# Patient Record
Sex: Female | Born: 1945
Health system: Southern US, Community
[De-identification: ages and names within clinical notes are randomized; demographics above are authoritative.]

## PROBLEM LIST (undated history)

## (undated) DIAGNOSIS — R3911 Hesitancy of micturition: Secondary | ICD-10-CM

## (undated) DIAGNOSIS — F32A Depression, unspecified: Secondary | ICD-10-CM

## (undated) DIAGNOSIS — N958 Other specified menopausal and perimenopausal disorders: Secondary | ICD-10-CM

## (undated) DIAGNOSIS — M1990 Unspecified osteoarthritis, unspecified site: Secondary | ICD-10-CM

## (undated) DIAGNOSIS — T7840XA Allergy, unspecified, initial encounter: Secondary | ICD-10-CM

## (undated) DIAGNOSIS — D649 Anemia, unspecified: Secondary | ICD-10-CM

## (undated) DIAGNOSIS — I10 Essential (primary) hypertension: Secondary | ICD-10-CM

## (undated) DIAGNOSIS — E119 Type 2 diabetes mellitus without complications: Secondary | ICD-10-CM

## (undated) DIAGNOSIS — N1831 Chronic kidney disease, stage 3a: Secondary | ICD-10-CM

## (undated) DIAGNOSIS — L408 Other psoriasis: Secondary | ICD-10-CM

## (undated) DIAGNOSIS — F419 Anxiety disorder, unspecified: Secondary | ICD-10-CM

## (undated) DIAGNOSIS — M810 Age-related osteoporosis without current pathological fracture: Secondary | ICD-10-CM

## (undated) DIAGNOSIS — E785 Hyperlipidemia, unspecified: Secondary | ICD-10-CM

## (undated) DIAGNOSIS — M26622 Arthralgia of left temporomandibular joint: Secondary | ICD-10-CM

## (undated) DIAGNOSIS — K644 Residual hemorrhoidal skin tags: Secondary | ICD-10-CM

## (undated) DIAGNOSIS — M542 Cervicalgia: Secondary | ICD-10-CM

## (undated) DIAGNOSIS — R296 Repeated falls: Secondary | ICD-10-CM

## (undated) DIAGNOSIS — F329 Major depressive disorder, single episode, unspecified: Secondary | ICD-10-CM

## (undated) DIAGNOSIS — E663 Overweight: Secondary | ICD-10-CM

## (undated) HISTORY — DX: Other specified menopausal and perimenopausal disorders: N95.8

## (undated) HISTORY — PX: CHOLECYSTECTOMY: SHX55

## (undated) HISTORY — DX: Residual hemorrhoidal skin tags: K64.4

## (undated) HISTORY — PX: KNEE SURGERY: SHX244

## (undated) HISTORY — DX: Arthralgia of left temporomandibular joint: M26.622

## (undated) HISTORY — DX: Depression, unspecified: F32.A

## (undated) HISTORY — DX: Hyperlipidemia, unspecified: E78.5

## (undated) HISTORY — DX: Type 2 diabetes mellitus without complications: E11.9

## (undated) HISTORY — DX: Chronic kidney disease, stage 3a: N18.31

## (undated) HISTORY — DX: Hesitancy of micturition: R39.11

## (undated) HISTORY — DX: Allergy, unspecified, initial encounter: T78.40XA

## (undated) HISTORY — DX: Anxiety disorder, unspecified: F41.9

## (undated) HISTORY — DX: Essential (primary) hypertension: I10

## (undated) HISTORY — DX: Anemia, unspecified: D64.9

## (undated) HISTORY — DX: Other psoriasis: L40.8

## (undated) HISTORY — PX: CATARACT EXTRACTION, BILATERAL: SHX1313

## (undated) HISTORY — PX: COLONOSCOPY: SHX174

## (undated) HISTORY — DX: Cervicalgia: M54.2

## (undated) HISTORY — DX: Repeated falls: R29.6

## (undated) HISTORY — DX: Overweight: E66.3

## (undated) HISTORY — PX: APPENDECTOMY: SHX54

## (undated) HISTORY — DX: Unspecified osteoarthritis, unspecified site: M19.90

## (undated) HISTORY — PX: OTHER SURGICAL HISTORY: SHX169

## (undated) HISTORY — DX: Age-related osteoporosis without current pathological fracture: M81.0

## (undated) HISTORY — PX: ABDOMINAL HYSTERECTOMY: SHX81

## (undated) HISTORY — PX: CARPAL TUNNEL RELEASE: SHX101

---
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
## 2001-12-30 ENCOUNTER — Encounter (INDEPENDENT_AMBULATORY_CARE_PROVIDER_SITE_OTHER): Payer: Self-pay | Admitting: Gastroenterology

## 2006-12-30 ENCOUNTER — Ambulatory Visit: Payer: Self-pay | Admitting: Internal Medicine

## 2007-01-13 ENCOUNTER — Ambulatory Visit: Payer: Self-pay | Admitting: Internal Medicine

## 2009-04-18 ENCOUNTER — Ambulatory Visit: Payer: Self-pay | Admitting: Internal Medicine

## 2009-04-18 DIAGNOSIS — K219 Gastro-esophageal reflux disease without esophagitis: Secondary | ICD-10-CM | POA: Insufficient documentation

## 2009-04-18 DIAGNOSIS — R1013 Epigastric pain: Secondary | ICD-10-CM | POA: Insufficient documentation

## 2009-04-18 DIAGNOSIS — R1319 Other dysphagia: Secondary | ICD-10-CM | POA: Insufficient documentation

## 2009-04-23 ENCOUNTER — Ambulatory Visit: Payer: Self-pay | Admitting: Internal Medicine

## 2009-04-23 ENCOUNTER — Encounter: Payer: Self-pay | Admitting: Internal Medicine

## 2009-04-24 ENCOUNTER — Encounter: Payer: Self-pay | Admitting: Internal Medicine

## 2011-02-20 ENCOUNTER — Other Ambulatory Visit: Payer: Self-pay | Admitting: Internal Medicine

## 2012-09-10 HISTORY — PX: ESOPHAGOGASTRODUODENOSCOPY: SHX1529

## 2013-02-17 ENCOUNTER — Encounter: Payer: Self-pay | Admitting: *Deleted

## 2013-02-17 NOTE — Patient Instructions (Signed)
Received via fax from [facility] consent to release medical records for GI records.

## 2013-02-18 ENCOUNTER — Encounter: Payer: Self-pay | Admitting: Internal Medicine

## 2015-06-21 DIAGNOSIS — M5442 Lumbago with sciatica, left side: Secondary | ICD-10-CM | POA: Diagnosis not present

## 2015-06-21 DIAGNOSIS — M9903 Segmental and somatic dysfunction of lumbar region: Secondary | ICD-10-CM | POA: Diagnosis not present

## 2015-06-21 DIAGNOSIS — M9904 Segmental and somatic dysfunction of sacral region: Secondary | ICD-10-CM | POA: Diagnosis not present

## 2015-06-21 DIAGNOSIS — M5136 Other intervertebral disc degeneration, lumbar region: Secondary | ICD-10-CM | POA: Diagnosis not present

## 2015-07-19 DIAGNOSIS — M5442 Lumbago with sciatica, left side: Secondary | ICD-10-CM | POA: Diagnosis not present

## 2015-07-19 DIAGNOSIS — M9904 Segmental and somatic dysfunction of sacral region: Secondary | ICD-10-CM | POA: Diagnosis not present

## 2015-07-19 DIAGNOSIS — M9903 Segmental and somatic dysfunction of lumbar region: Secondary | ICD-10-CM | POA: Diagnosis not present

## 2015-07-19 DIAGNOSIS — M5136 Other intervertebral disc degeneration, lumbar region: Secondary | ICD-10-CM | POA: Diagnosis not present

## 2015-07-30 DIAGNOSIS — I10 Essential (primary) hypertension: Secondary | ICD-10-CM | POA: Diagnosis not present

## 2015-07-30 DIAGNOSIS — F320 Major depressive disorder, single episode, mild: Secondary | ICD-10-CM | POA: Diagnosis not present

## 2015-07-30 DIAGNOSIS — M150 Primary generalized (osteo)arthritis: Secondary | ICD-10-CM | POA: Diagnosis not present

## 2015-07-30 DIAGNOSIS — E782 Mixed hyperlipidemia: Secondary | ICD-10-CM | POA: Diagnosis not present

## 2015-07-30 DIAGNOSIS — E119 Type 2 diabetes mellitus without complications: Secondary | ICD-10-CM | POA: Diagnosis not present

## 2015-08-16 DIAGNOSIS — M5136 Other intervertebral disc degeneration, lumbar region: Secondary | ICD-10-CM | POA: Diagnosis not present

## 2015-08-16 DIAGNOSIS — M9904 Segmental and somatic dysfunction of sacral region: Secondary | ICD-10-CM | POA: Diagnosis not present

## 2015-08-16 DIAGNOSIS — M5442 Lumbago with sciatica, left side: Secondary | ICD-10-CM | POA: Diagnosis not present

## 2015-08-16 DIAGNOSIS — M9903 Segmental and somatic dysfunction of lumbar region: Secondary | ICD-10-CM | POA: Diagnosis not present

## 2015-09-20 DIAGNOSIS — M5442 Lumbago with sciatica, left side: Secondary | ICD-10-CM | POA: Diagnosis not present

## 2015-09-20 DIAGNOSIS — M5136 Other intervertebral disc degeneration, lumbar region: Secondary | ICD-10-CM | POA: Diagnosis not present

## 2015-09-20 DIAGNOSIS — M9903 Segmental and somatic dysfunction of lumbar region: Secondary | ICD-10-CM | POA: Diagnosis not present

## 2015-09-20 DIAGNOSIS — M9904 Segmental and somatic dysfunction of sacral region: Secondary | ICD-10-CM | POA: Diagnosis not present

## 2015-09-28 DIAGNOSIS — R21 Rash and other nonspecific skin eruption: Secondary | ICD-10-CM | POA: Diagnosis not present

## 2015-09-28 DIAGNOSIS — N3000 Acute cystitis without hematuria: Secondary | ICD-10-CM | POA: Diagnosis not present

## 2015-10-02 DIAGNOSIS — H2513 Age-related nuclear cataract, bilateral: Secondary | ICD-10-CM | POA: Diagnosis not present

## 2015-10-02 DIAGNOSIS — H524 Presbyopia: Secondary | ICD-10-CM | POA: Diagnosis not present

## 2015-10-02 DIAGNOSIS — E119 Type 2 diabetes mellitus without complications: Secondary | ICD-10-CM | POA: Diagnosis not present

## 2015-10-18 DIAGNOSIS — L304 Erythema intertrigo: Secondary | ICD-10-CM | POA: Diagnosis not present

## 2015-10-18 DIAGNOSIS — M9904 Segmental and somatic dysfunction of sacral region: Secondary | ICD-10-CM | POA: Diagnosis not present

## 2015-10-18 DIAGNOSIS — M9903 Segmental and somatic dysfunction of lumbar region: Secondary | ICD-10-CM | POA: Diagnosis not present

## 2015-10-18 DIAGNOSIS — M5442 Lumbago with sciatica, left side: Secondary | ICD-10-CM | POA: Diagnosis not present

## 2015-10-18 DIAGNOSIS — M5136 Other intervertebral disc degeneration, lumbar region: Secondary | ICD-10-CM | POA: Diagnosis not present

## 2015-10-29 DIAGNOSIS — I10 Essential (primary) hypertension: Secondary | ICD-10-CM | POA: Diagnosis not present

## 2015-10-29 DIAGNOSIS — E782 Mixed hyperlipidemia: Secondary | ICD-10-CM | POA: Diagnosis not present

## 2015-10-29 DIAGNOSIS — G5600 Carpal tunnel syndrome, unspecified upper limb: Secondary | ICD-10-CM | POA: Diagnosis not present

## 2015-10-29 DIAGNOSIS — M150 Primary generalized (osteo)arthritis: Secondary | ICD-10-CM | POA: Diagnosis not present

## 2015-10-29 DIAGNOSIS — M790 Rheumatism, unspecified: Secondary | ICD-10-CM | POA: Diagnosis not present

## 2015-10-29 DIAGNOSIS — L408 Other psoriasis: Secondary | ICD-10-CM | POA: Diagnosis not present

## 2015-10-29 DIAGNOSIS — M791 Myalgia: Secondary | ICD-10-CM | POA: Diagnosis not present

## 2015-10-29 DIAGNOSIS — E119 Type 2 diabetes mellitus without complications: Secondary | ICD-10-CM | POA: Diagnosis not present

## 2015-10-29 DIAGNOSIS — F320 Major depressive disorder, single episode, mild: Secondary | ICD-10-CM | POA: Diagnosis not present

## 2015-11-22 DIAGNOSIS — M9903 Segmental and somatic dysfunction of lumbar region: Secondary | ICD-10-CM | POA: Diagnosis not present

## 2015-11-22 DIAGNOSIS — M9904 Segmental and somatic dysfunction of sacral region: Secondary | ICD-10-CM | POA: Diagnosis not present

## 2015-11-22 DIAGNOSIS — M5442 Lumbago with sciatica, left side: Secondary | ICD-10-CM | POA: Diagnosis not present

## 2015-11-22 DIAGNOSIS — M5136 Other intervertebral disc degeneration, lumbar region: Secondary | ICD-10-CM | POA: Diagnosis not present

## 2015-12-27 DIAGNOSIS — M9903 Segmental and somatic dysfunction of lumbar region: Secondary | ICD-10-CM | POA: Diagnosis not present

## 2015-12-27 DIAGNOSIS — M5136 Other intervertebral disc degeneration, lumbar region: Secondary | ICD-10-CM | POA: Diagnosis not present

## 2015-12-27 DIAGNOSIS — M9904 Segmental and somatic dysfunction of sacral region: Secondary | ICD-10-CM | POA: Diagnosis not present

## 2015-12-27 DIAGNOSIS — M5442 Lumbago with sciatica, left side: Secondary | ICD-10-CM | POA: Diagnosis not present

## 2016-01-24 DIAGNOSIS — M5442 Lumbago with sciatica, left side: Secondary | ICD-10-CM | POA: Diagnosis not present

## 2016-01-24 DIAGNOSIS — M9903 Segmental and somatic dysfunction of lumbar region: Secondary | ICD-10-CM | POA: Diagnosis not present

## 2016-01-24 DIAGNOSIS — M9904 Segmental and somatic dysfunction of sacral region: Secondary | ICD-10-CM | POA: Diagnosis not present

## 2016-01-24 DIAGNOSIS — M5136 Other intervertebral disc degeneration, lumbar region: Secondary | ICD-10-CM | POA: Diagnosis not present

## 2016-01-30 DIAGNOSIS — I10 Essential (primary) hypertension: Secondary | ICD-10-CM | POA: Diagnosis not present

## 2016-01-30 DIAGNOSIS — E782 Mixed hyperlipidemia: Secondary | ICD-10-CM | POA: Diagnosis not present

## 2016-01-30 DIAGNOSIS — R233 Spontaneous ecchymoses: Secondary | ICD-10-CM | POA: Diagnosis not present

## 2016-01-30 DIAGNOSIS — M79604 Pain in right leg: Secondary | ICD-10-CM | POA: Diagnosis not present

## 2016-01-30 DIAGNOSIS — Z1211 Encounter for screening for malignant neoplasm of colon: Secondary | ICD-10-CM | POA: Diagnosis not present

## 2016-01-30 DIAGNOSIS — M79605 Pain in left leg: Secondary | ICD-10-CM | POA: Diagnosis not present

## 2016-01-30 DIAGNOSIS — E119 Type 2 diabetes mellitus without complications: Secondary | ICD-10-CM | POA: Diagnosis not present

## 2016-02-21 DIAGNOSIS — M9904 Segmental and somatic dysfunction of sacral region: Secondary | ICD-10-CM | POA: Diagnosis not present

## 2016-02-21 DIAGNOSIS — M9903 Segmental and somatic dysfunction of lumbar region: Secondary | ICD-10-CM | POA: Diagnosis not present

## 2016-02-21 DIAGNOSIS — M5136 Other intervertebral disc degeneration, lumbar region: Secondary | ICD-10-CM | POA: Diagnosis not present

## 2016-02-21 DIAGNOSIS — M5442 Lumbago with sciatica, left side: Secondary | ICD-10-CM | POA: Diagnosis not present

## 2016-03-13 DIAGNOSIS — Z1211 Encounter for screening for malignant neoplasm of colon: Secondary | ICD-10-CM | POA: Diagnosis not present

## 2016-03-13 DIAGNOSIS — K580 Irritable bowel syndrome with diarrhea: Secondary | ICD-10-CM | POA: Diagnosis not present

## 2016-03-13 DIAGNOSIS — Z800 Family history of malignant neoplasm of digestive organs: Secondary | ICD-10-CM | POA: Diagnosis not present

## 2016-03-20 DIAGNOSIS — M5442 Lumbago with sciatica, left side: Secondary | ICD-10-CM | POA: Diagnosis not present

## 2016-03-20 DIAGNOSIS — M9904 Segmental and somatic dysfunction of sacral region: Secondary | ICD-10-CM | POA: Diagnosis not present

## 2016-03-20 DIAGNOSIS — Z23 Encounter for immunization: Secondary | ICD-10-CM | POA: Diagnosis not present

## 2016-03-20 DIAGNOSIS — M9903 Segmental and somatic dysfunction of lumbar region: Secondary | ICD-10-CM | POA: Diagnosis not present

## 2016-03-20 DIAGNOSIS — M5136 Other intervertebral disc degeneration, lumbar region: Secondary | ICD-10-CM | POA: Diagnosis not present

## 2016-04-09 DIAGNOSIS — Z9049 Acquired absence of other specified parts of digestive tract: Secondary | ICD-10-CM | POA: Diagnosis not present

## 2016-04-09 DIAGNOSIS — R197 Diarrhea, unspecified: Secondary | ICD-10-CM | POA: Diagnosis not present

## 2016-04-09 DIAGNOSIS — Z7984 Long term (current) use of oral hypoglycemic drugs: Secondary | ICD-10-CM | POA: Diagnosis not present

## 2016-04-09 DIAGNOSIS — I10 Essential (primary) hypertension: Secondary | ICD-10-CM | POA: Diagnosis not present

## 2016-04-09 DIAGNOSIS — Z800 Family history of malignant neoplasm of digestive organs: Secondary | ICD-10-CM | POA: Diagnosis not present

## 2016-04-09 DIAGNOSIS — K5730 Diverticulosis of large intestine without perforation or abscess without bleeding: Secondary | ICD-10-CM | POA: Diagnosis not present

## 2016-04-09 DIAGNOSIS — E785 Hyperlipidemia, unspecified: Secondary | ICD-10-CM | POA: Diagnosis not present

## 2016-04-09 DIAGNOSIS — Z79899 Other long term (current) drug therapy: Secondary | ICD-10-CM | POA: Diagnosis not present

## 2016-04-09 DIAGNOSIS — F419 Anxiety disorder, unspecified: Secondary | ICD-10-CM | POA: Diagnosis not present

## 2016-04-09 DIAGNOSIS — E119 Type 2 diabetes mellitus without complications: Secondary | ICD-10-CM | POA: Diagnosis not present

## 2016-04-09 DIAGNOSIS — D175 Benign lipomatous neoplasm of intra-abdominal organs: Secondary | ICD-10-CM | POA: Diagnosis not present

## 2016-04-09 DIAGNOSIS — K648 Other hemorrhoids: Secondary | ICD-10-CM | POA: Diagnosis not present

## 2016-04-09 DIAGNOSIS — K219 Gastro-esophageal reflux disease without esophagitis: Secondary | ICD-10-CM | POA: Diagnosis not present

## 2016-04-09 DIAGNOSIS — Z1211 Encounter for screening for malignant neoplasm of colon: Secondary | ICD-10-CM | POA: Diagnosis not present

## 2016-04-09 DIAGNOSIS — K580 Irritable bowel syndrome with diarrhea: Secondary | ICD-10-CM | POA: Diagnosis not present

## 2016-04-09 HISTORY — PX: COLONOSCOPY: SHX174

## 2016-05-10 DIAGNOSIS — A088 Other specified intestinal infections: Secondary | ICD-10-CM | POA: Diagnosis not present

## 2016-05-14 DIAGNOSIS — A0839 Other viral enteritis: Secondary | ICD-10-CM | POA: Diagnosis not present

## 2016-05-14 DIAGNOSIS — R197 Diarrhea, unspecified: Secondary | ICD-10-CM | POA: Diagnosis not present

## 2016-05-14 DIAGNOSIS — R110 Nausea: Secondary | ICD-10-CM | POA: Diagnosis not present

## 2016-05-16 DIAGNOSIS — R197 Diarrhea, unspecified: Secondary | ICD-10-CM | POA: Diagnosis not present

## 2016-06-05 DIAGNOSIS — Z0000 Encounter for general adult medical examination without abnormal findings: Secondary | ICD-10-CM | POA: Diagnosis not present

## 2016-06-05 DIAGNOSIS — Z1239 Encounter for other screening for malignant neoplasm of breast: Secondary | ICD-10-CM | POA: Diagnosis not present

## 2016-06-05 DIAGNOSIS — Z6827 Body mass index (BMI) 27.0-27.9, adult: Secondary | ICD-10-CM | POA: Diagnosis not present

## 2016-06-26 DIAGNOSIS — M5136 Other intervertebral disc degeneration, lumbar region: Secondary | ICD-10-CM | POA: Diagnosis not present

## 2016-06-26 DIAGNOSIS — M9904 Segmental and somatic dysfunction of sacral region: Secondary | ICD-10-CM | POA: Diagnosis not present

## 2016-06-26 DIAGNOSIS — M5442 Lumbago with sciatica, left side: Secondary | ICD-10-CM | POA: Diagnosis not present

## 2016-06-26 DIAGNOSIS — M9903 Segmental and somatic dysfunction of lumbar region: Secondary | ICD-10-CM | POA: Diagnosis not present

## 2016-07-14 DIAGNOSIS — E119 Type 2 diabetes mellitus without complications: Secondary | ICD-10-CM | POA: Diagnosis not present

## 2016-07-14 DIAGNOSIS — E782 Mixed hyperlipidemia: Secondary | ICD-10-CM | POA: Diagnosis not present

## 2016-07-14 DIAGNOSIS — I10 Essential (primary) hypertension: Secondary | ICD-10-CM | POA: Diagnosis not present

## 2016-07-14 DIAGNOSIS — F320 Major depressive disorder, single episode, mild: Secondary | ICD-10-CM | POA: Diagnosis not present

## 2016-07-18 DIAGNOSIS — Z1231 Encounter for screening mammogram for malignant neoplasm of breast: Secondary | ICD-10-CM | POA: Diagnosis not present

## 2016-07-30 DIAGNOSIS — M9904 Segmental and somatic dysfunction of sacral region: Secondary | ICD-10-CM | POA: Diagnosis not present

## 2016-07-30 DIAGNOSIS — M9903 Segmental and somatic dysfunction of lumbar region: Secondary | ICD-10-CM | POA: Diagnosis not present

## 2016-07-30 DIAGNOSIS — M5136 Other intervertebral disc degeneration, lumbar region: Secondary | ICD-10-CM | POA: Diagnosis not present

## 2016-07-30 DIAGNOSIS — M5442 Lumbago with sciatica, left side: Secondary | ICD-10-CM | POA: Diagnosis not present

## 2016-08-27 DIAGNOSIS — M9904 Segmental and somatic dysfunction of sacral region: Secondary | ICD-10-CM | POA: Diagnosis not present

## 2016-08-27 DIAGNOSIS — M5136 Other intervertebral disc degeneration, lumbar region: Secondary | ICD-10-CM | POA: Diagnosis not present

## 2016-08-27 DIAGNOSIS — M9903 Segmental and somatic dysfunction of lumbar region: Secondary | ICD-10-CM | POA: Diagnosis not present

## 2016-08-27 DIAGNOSIS — M5442 Lumbago with sciatica, left side: Secondary | ICD-10-CM | POA: Diagnosis not present

## 2016-09-02 DIAGNOSIS — H60312 Diffuse otitis externa, left ear: Secondary | ICD-10-CM | POA: Diagnosis not present

## 2016-09-02 DIAGNOSIS — J060 Acute laryngopharyngitis: Secondary | ICD-10-CM | POA: Diagnosis not present

## 2016-09-02 DIAGNOSIS — J069 Acute upper respiratory infection, unspecified: Secondary | ICD-10-CM | POA: Diagnosis not present

## 2016-09-24 DIAGNOSIS — M9904 Segmental and somatic dysfunction of sacral region: Secondary | ICD-10-CM | POA: Diagnosis not present

## 2016-09-24 DIAGNOSIS — M5442 Lumbago with sciatica, left side: Secondary | ICD-10-CM | POA: Diagnosis not present

## 2016-09-24 DIAGNOSIS — M9903 Segmental and somatic dysfunction of lumbar region: Secondary | ICD-10-CM | POA: Diagnosis not present

## 2016-09-24 DIAGNOSIS — M5136 Other intervertebral disc degeneration, lumbar region: Secondary | ICD-10-CM | POA: Diagnosis not present

## 2016-10-01 DIAGNOSIS — E119 Type 2 diabetes mellitus without complications: Secondary | ICD-10-CM | POA: Diagnosis not present

## 2016-10-01 DIAGNOSIS — H25013 Cortical age-related cataract, bilateral: Secondary | ICD-10-CM | POA: Diagnosis not present

## 2016-10-22 DIAGNOSIS — M9903 Segmental and somatic dysfunction of lumbar region: Secondary | ICD-10-CM | POA: Diagnosis not present

## 2016-10-22 DIAGNOSIS — M5442 Lumbago with sciatica, left side: Secondary | ICD-10-CM | POA: Diagnosis not present

## 2016-10-22 DIAGNOSIS — M5136 Other intervertebral disc degeneration, lumbar region: Secondary | ICD-10-CM | POA: Diagnosis not present

## 2016-10-22 DIAGNOSIS — M9904 Segmental and somatic dysfunction of sacral region: Secondary | ICD-10-CM | POA: Diagnosis not present

## 2016-10-30 DIAGNOSIS — M542 Cervicalgia: Secondary | ICD-10-CM | POA: Diagnosis not present

## 2016-10-31 DIAGNOSIS — M542 Cervicalgia: Secondary | ICD-10-CM | POA: Diagnosis not present

## 2016-10-31 DIAGNOSIS — M47892 Other spondylosis, cervical region: Secondary | ICD-10-CM | POA: Diagnosis not present

## 2016-11-08 DIAGNOSIS — M4802 Spinal stenosis, cervical region: Secondary | ICD-10-CM | POA: Diagnosis not present

## 2016-11-08 DIAGNOSIS — M50322 Other cervical disc degeneration at C5-C6 level: Secondary | ICD-10-CM | POA: Diagnosis not present

## 2016-11-19 DIAGNOSIS — M5136 Other intervertebral disc degeneration, lumbar region: Secondary | ICD-10-CM | POA: Diagnosis not present

## 2016-11-19 DIAGNOSIS — M9903 Segmental and somatic dysfunction of lumbar region: Secondary | ICD-10-CM | POA: Diagnosis not present

## 2016-11-19 DIAGNOSIS — M5442 Lumbago with sciatica, left side: Secondary | ICD-10-CM | POA: Diagnosis not present

## 2016-11-19 DIAGNOSIS — M9904 Segmental and somatic dysfunction of sacral region: Secondary | ICD-10-CM | POA: Diagnosis not present

## 2016-11-24 DIAGNOSIS — M542 Cervicalgia: Secondary | ICD-10-CM | POA: Diagnosis not present

## 2016-12-02 DIAGNOSIS — M47812 Spondylosis without myelopathy or radiculopathy, cervical region: Secondary | ICD-10-CM | POA: Diagnosis not present

## 2016-12-02 DIAGNOSIS — Z6827 Body mass index (BMI) 27.0-27.9, adult: Secondary | ICD-10-CM | POA: Diagnosis not present

## 2016-12-02 DIAGNOSIS — M5412 Radiculopathy, cervical region: Secondary | ICD-10-CM | POA: Diagnosis not present

## 2016-12-02 DIAGNOSIS — M4802 Spinal stenosis, cervical region: Secondary | ICD-10-CM | POA: Diagnosis not present

## 2016-12-23 DIAGNOSIS — I10 Essential (primary) hypertension: Secondary | ICD-10-CM | POA: Diagnosis not present

## 2016-12-23 DIAGNOSIS — Z6827 Body mass index (BMI) 27.0-27.9, adult: Secondary | ICD-10-CM | POA: Diagnosis not present

## 2016-12-23 DIAGNOSIS — M5412 Radiculopathy, cervical region: Secondary | ICD-10-CM | POA: Diagnosis not present

## 2016-12-23 DIAGNOSIS — M4802 Spinal stenosis, cervical region: Secondary | ICD-10-CM | POA: Diagnosis not present

## 2016-12-31 DIAGNOSIS — M5136 Other intervertebral disc degeneration, lumbar region: Secondary | ICD-10-CM | POA: Diagnosis not present

## 2016-12-31 DIAGNOSIS — M9903 Segmental and somatic dysfunction of lumbar region: Secondary | ICD-10-CM | POA: Diagnosis not present

## 2016-12-31 DIAGNOSIS — M5442 Lumbago with sciatica, left side: Secondary | ICD-10-CM | POA: Diagnosis not present

## 2016-12-31 DIAGNOSIS — M9904 Segmental and somatic dysfunction of sacral region: Secondary | ICD-10-CM | POA: Diagnosis not present

## 2017-01-29 DIAGNOSIS — I10 Essential (primary) hypertension: Secondary | ICD-10-CM | POA: Diagnosis not present

## 2017-01-29 DIAGNOSIS — Z6828 Body mass index (BMI) 28.0-28.9, adult: Secondary | ICD-10-CM | POA: Diagnosis not present

## 2017-01-29 DIAGNOSIS — E782 Mixed hyperlipidemia: Secondary | ICD-10-CM | POA: Diagnosis not present

## 2017-01-29 DIAGNOSIS — E119 Type 2 diabetes mellitus without complications: Secondary | ICD-10-CM | POA: Diagnosis not present

## 2017-01-29 DIAGNOSIS — G542 Cervical root disorders, not elsewhere classified: Secondary | ICD-10-CM | POA: Diagnosis not present

## 2017-01-29 DIAGNOSIS — E663 Overweight: Secondary | ICD-10-CM | POA: Diagnosis not present

## 2017-01-29 DIAGNOSIS — Z23 Encounter for immunization: Secondary | ICD-10-CM | POA: Diagnosis not present

## 2017-02-09 ENCOUNTER — Encounter: Payer: Self-pay | Admitting: Internal Medicine

## 2017-02-09 DIAGNOSIS — M5442 Lumbago with sciatica, left side: Secondary | ICD-10-CM | POA: Diagnosis not present

## 2017-02-09 DIAGNOSIS — M5136 Other intervertebral disc degeneration, lumbar region: Secondary | ICD-10-CM | POA: Diagnosis not present

## 2017-02-09 DIAGNOSIS — M9903 Segmental and somatic dysfunction of lumbar region: Secondary | ICD-10-CM | POA: Diagnosis not present

## 2017-02-09 DIAGNOSIS — M9904 Segmental and somatic dysfunction of sacral region: Secondary | ICD-10-CM | POA: Diagnosis not present

## 2017-02-10 DIAGNOSIS — M47812 Spondylosis without myelopathy or radiculopathy, cervical region: Secondary | ICD-10-CM | POA: Diagnosis not present

## 2017-03-09 DIAGNOSIS — Z6827 Body mass index (BMI) 27.0-27.9, adult: Secondary | ICD-10-CM | POA: Diagnosis not present

## 2017-03-09 DIAGNOSIS — M47812 Spondylosis without myelopathy or radiculopathy, cervical region: Secondary | ICD-10-CM | POA: Diagnosis not present

## 2017-03-12 DIAGNOSIS — M9903 Segmental and somatic dysfunction of lumbar region: Secondary | ICD-10-CM | POA: Diagnosis not present

## 2017-03-12 DIAGNOSIS — M5442 Lumbago with sciatica, left side: Secondary | ICD-10-CM | POA: Diagnosis not present

## 2017-03-12 DIAGNOSIS — M9904 Segmental and somatic dysfunction of sacral region: Secondary | ICD-10-CM | POA: Diagnosis not present

## 2017-03-12 DIAGNOSIS — M5136 Other intervertebral disc degeneration, lumbar region: Secondary | ICD-10-CM | POA: Diagnosis not present

## 2017-04-07 DIAGNOSIS — M5412 Radiculopathy, cervical region: Secondary | ICD-10-CM | POA: Diagnosis not present

## 2017-04-07 DIAGNOSIS — M4802 Spinal stenosis, cervical region: Secondary | ICD-10-CM | POA: Diagnosis not present

## 2017-04-10 DIAGNOSIS — Z23 Encounter for immunization: Secondary | ICD-10-CM | POA: Diagnosis not present

## 2017-04-21 DIAGNOSIS — M26622 Arthralgia of left temporomandibular joint: Secondary | ICD-10-CM | POA: Diagnosis not present

## 2017-04-23 DIAGNOSIS — M5136 Other intervertebral disc degeneration, lumbar region: Secondary | ICD-10-CM | POA: Diagnosis not present

## 2017-04-23 DIAGNOSIS — M9904 Segmental and somatic dysfunction of sacral region: Secondary | ICD-10-CM | POA: Diagnosis not present

## 2017-04-23 DIAGNOSIS — M5442 Lumbago with sciatica, left side: Secondary | ICD-10-CM | POA: Diagnosis not present

## 2017-04-23 DIAGNOSIS — M9903 Segmental and somatic dysfunction of lumbar region: Secondary | ICD-10-CM | POA: Diagnosis not present

## 2017-05-12 DIAGNOSIS — M5412 Radiculopathy, cervical region: Secondary | ICD-10-CM | POA: Diagnosis not present

## 2017-05-21 DIAGNOSIS — M9904 Segmental and somatic dysfunction of sacral region: Secondary | ICD-10-CM | POA: Diagnosis not present

## 2017-05-21 DIAGNOSIS — M5136 Other intervertebral disc degeneration, lumbar region: Secondary | ICD-10-CM | POA: Diagnosis not present

## 2017-05-21 DIAGNOSIS — M5442 Lumbago with sciatica, left side: Secondary | ICD-10-CM | POA: Diagnosis not present

## 2017-05-21 DIAGNOSIS — M9903 Segmental and somatic dysfunction of lumbar region: Secondary | ICD-10-CM | POA: Diagnosis not present

## 2017-06-11 DIAGNOSIS — E119 Type 2 diabetes mellitus without complications: Secondary | ICD-10-CM | POA: Diagnosis not present

## 2017-06-11 DIAGNOSIS — E782 Mixed hyperlipidemia: Secondary | ICD-10-CM | POA: Diagnosis not present

## 2017-06-11 DIAGNOSIS — I10 Essential (primary) hypertension: Secondary | ICD-10-CM | POA: Diagnosis not present

## 2017-06-11 DIAGNOSIS — N958 Other specified menopausal and perimenopausal disorders: Secondary | ICD-10-CM | POA: Diagnosis not present

## 2017-06-11 DIAGNOSIS — E663 Overweight: Secondary | ICD-10-CM | POA: Diagnosis not present

## 2017-06-11 DIAGNOSIS — Z0001 Encounter for general adult medical examination with abnormal findings: Secondary | ICD-10-CM | POA: Diagnosis not present

## 2017-06-11 DIAGNOSIS — Z1231 Encounter for screening mammogram for malignant neoplasm of breast: Secondary | ICD-10-CM | POA: Diagnosis not present

## 2017-06-11 DIAGNOSIS — Z6828 Body mass index (BMI) 28.0-28.9, adult: Secondary | ICD-10-CM | POA: Diagnosis not present

## 2017-06-18 DIAGNOSIS — M5442 Lumbago with sciatica, left side: Secondary | ICD-10-CM | POA: Diagnosis not present

## 2017-06-18 DIAGNOSIS — M9903 Segmental and somatic dysfunction of lumbar region: Secondary | ICD-10-CM | POA: Diagnosis not present

## 2017-06-18 DIAGNOSIS — M5136 Other intervertebral disc degeneration, lumbar region: Secondary | ICD-10-CM | POA: Diagnosis not present

## 2017-06-18 DIAGNOSIS — M9904 Segmental and somatic dysfunction of sacral region: Secondary | ICD-10-CM | POA: Diagnosis not present

## 2017-07-02 DIAGNOSIS — E1121 Type 2 diabetes mellitus with diabetic nephropathy: Secondary | ICD-10-CM | POA: Diagnosis not present

## 2017-07-02 DIAGNOSIS — E782 Mixed hyperlipidemia: Secondary | ICD-10-CM | POA: Diagnosis not present

## 2017-07-02 DIAGNOSIS — I10 Essential (primary) hypertension: Secondary | ICD-10-CM | POA: Diagnosis not present

## 2017-07-02 DIAGNOSIS — M542 Cervicalgia: Secondary | ICD-10-CM | POA: Diagnosis not present

## 2017-07-02 DIAGNOSIS — M26622 Arthralgia of left temporomandibular joint: Secondary | ICD-10-CM | POA: Diagnosis not present

## 2017-07-14 DIAGNOSIS — Z6828 Body mass index (BMI) 28.0-28.9, adult: Secondary | ICD-10-CM | POA: Diagnosis not present

## 2017-07-14 DIAGNOSIS — M5412 Radiculopathy, cervical region: Secondary | ICD-10-CM | POA: Diagnosis not present

## 2017-07-20 DIAGNOSIS — N959 Unspecified menopausal and perimenopausal disorder: Secondary | ICD-10-CM | POA: Diagnosis not present

## 2017-07-20 DIAGNOSIS — M8580 Other specified disorders of bone density and structure, unspecified site: Secondary | ICD-10-CM | POA: Diagnosis not present

## 2017-07-20 DIAGNOSIS — M85852 Other specified disorders of bone density and structure, left thigh: Secondary | ICD-10-CM | POA: Diagnosis not present

## 2017-07-20 DIAGNOSIS — Z1231 Encounter for screening mammogram for malignant neoplasm of breast: Secondary | ICD-10-CM | POA: Diagnosis not present

## 2017-07-20 LAB — HM DEXA SCAN

## 2017-07-23 DIAGNOSIS — M9904 Segmental and somatic dysfunction of sacral region: Secondary | ICD-10-CM | POA: Diagnosis not present

## 2017-07-23 DIAGNOSIS — M9903 Segmental and somatic dysfunction of lumbar region: Secondary | ICD-10-CM | POA: Diagnosis not present

## 2017-07-23 DIAGNOSIS — M5442 Lumbago with sciatica, left side: Secondary | ICD-10-CM | POA: Diagnosis not present

## 2017-07-23 DIAGNOSIS — M5136 Other intervertebral disc degeneration, lumbar region: Secondary | ICD-10-CM | POA: Diagnosis not present

## 2017-08-20 DIAGNOSIS — M5136 Other intervertebral disc degeneration, lumbar region: Secondary | ICD-10-CM | POA: Diagnosis not present

## 2017-08-20 DIAGNOSIS — M9904 Segmental and somatic dysfunction of sacral region: Secondary | ICD-10-CM | POA: Diagnosis not present

## 2017-08-20 DIAGNOSIS — M9903 Segmental and somatic dysfunction of lumbar region: Secondary | ICD-10-CM | POA: Diagnosis not present

## 2017-08-20 DIAGNOSIS — M5442 Lumbago with sciatica, left side: Secondary | ICD-10-CM | POA: Diagnosis not present

## 2017-09-17 DIAGNOSIS — M5442 Lumbago with sciatica, left side: Secondary | ICD-10-CM | POA: Diagnosis not present

## 2017-09-17 DIAGNOSIS — M5136 Other intervertebral disc degeneration, lumbar region: Secondary | ICD-10-CM | POA: Diagnosis not present

## 2017-09-17 DIAGNOSIS — M9903 Segmental and somatic dysfunction of lumbar region: Secondary | ICD-10-CM | POA: Diagnosis not present

## 2017-09-17 DIAGNOSIS — M9904 Segmental and somatic dysfunction of sacral region: Secondary | ICD-10-CM | POA: Diagnosis not present

## 2017-10-06 DIAGNOSIS — H524 Presbyopia: Secondary | ICD-10-CM | POA: Diagnosis not present

## 2017-10-06 DIAGNOSIS — H2513 Age-related nuclear cataract, bilateral: Secondary | ICD-10-CM | POA: Diagnosis not present

## 2017-10-06 DIAGNOSIS — E119 Type 2 diabetes mellitus without complications: Secondary | ICD-10-CM | POA: Diagnosis not present

## 2017-10-14 DIAGNOSIS — M5136 Other intervertebral disc degeneration, lumbar region: Secondary | ICD-10-CM | POA: Diagnosis not present

## 2017-10-14 DIAGNOSIS — M5442 Lumbago with sciatica, left side: Secondary | ICD-10-CM | POA: Diagnosis not present

## 2017-10-14 DIAGNOSIS — M9903 Segmental and somatic dysfunction of lumbar region: Secondary | ICD-10-CM | POA: Diagnosis not present

## 2017-10-14 DIAGNOSIS — M9904 Segmental and somatic dysfunction of sacral region: Secondary | ICD-10-CM | POA: Diagnosis not present

## 2017-11-10 DIAGNOSIS — H02413 Mechanical ptosis of bilateral eyelids: Secondary | ICD-10-CM | POA: Diagnosis not present

## 2017-11-11 DIAGNOSIS — H02413 Mechanical ptosis of bilateral eyelids: Secondary | ICD-10-CM | POA: Diagnosis not present

## 2017-11-11 DIAGNOSIS — Z01818 Encounter for other preprocedural examination: Secondary | ICD-10-CM | POA: Diagnosis not present

## 2017-12-02 DIAGNOSIS — H02413 Mechanical ptosis of bilateral eyelids: Secondary | ICD-10-CM | POA: Diagnosis not present

## 2017-12-02 DIAGNOSIS — H02412 Mechanical ptosis of left eyelid: Secondary | ICD-10-CM | POA: Diagnosis not present

## 2017-12-02 DIAGNOSIS — H02411 Mechanical ptosis of right eyelid: Secondary | ICD-10-CM | POA: Diagnosis not present

## 2017-12-30 DIAGNOSIS — I10 Essential (primary) hypertension: Secondary | ICD-10-CM | POA: Diagnosis not present

## 2017-12-30 DIAGNOSIS — M25561 Pain in right knee: Secondary | ICD-10-CM | POA: Diagnosis not present

## 2017-12-30 DIAGNOSIS — E782 Mixed hyperlipidemia: Secondary | ICD-10-CM | POA: Diagnosis not present

## 2017-12-30 DIAGNOSIS — E1121 Type 2 diabetes mellitus with diabetic nephropathy: Secondary | ICD-10-CM | POA: Diagnosis not present

## 2017-12-30 DIAGNOSIS — Z6828 Body mass index (BMI) 28.0-28.9, adult: Secondary | ICD-10-CM | POA: Diagnosis not present

## 2017-12-30 DIAGNOSIS — M25531 Pain in right wrist: Secondary | ICD-10-CM | POA: Diagnosis not present

## 2017-12-30 DIAGNOSIS — E663 Overweight: Secondary | ICD-10-CM | POA: Diagnosis not present

## 2017-12-31 DIAGNOSIS — M11231 Other chondrocalcinosis, right wrist: Secondary | ICD-10-CM | POA: Diagnosis not present

## 2017-12-31 DIAGNOSIS — M19031 Primary osteoarthritis, right wrist: Secondary | ICD-10-CM | POA: Diagnosis not present

## 2017-12-31 DIAGNOSIS — M25531 Pain in right wrist: Secondary | ICD-10-CM | POA: Diagnosis not present

## 2018-02-17 DIAGNOSIS — Z23 Encounter for immunization: Secondary | ICD-10-CM | POA: Diagnosis not present

## 2018-02-18 DIAGNOSIS — M5136 Other intervertebral disc degeneration, lumbar region: Secondary | ICD-10-CM | POA: Diagnosis not present

## 2018-02-18 DIAGNOSIS — M9904 Segmental and somatic dysfunction of sacral region: Secondary | ICD-10-CM | POA: Diagnosis not present

## 2018-02-18 DIAGNOSIS — M9903 Segmental and somatic dysfunction of lumbar region: Secondary | ICD-10-CM | POA: Diagnosis not present

## 2018-02-18 DIAGNOSIS — M5442 Lumbago with sciatica, left side: Secondary | ICD-10-CM | POA: Diagnosis not present

## 2018-03-17 DIAGNOSIS — M9903 Segmental and somatic dysfunction of lumbar region: Secondary | ICD-10-CM | POA: Diagnosis not present

## 2018-03-17 DIAGNOSIS — M5136 Other intervertebral disc degeneration, lumbar region: Secondary | ICD-10-CM | POA: Diagnosis not present

## 2018-03-17 DIAGNOSIS — M9904 Segmental and somatic dysfunction of sacral region: Secondary | ICD-10-CM | POA: Diagnosis not present

## 2018-03-17 DIAGNOSIS — M5442 Lumbago with sciatica, left side: Secondary | ICD-10-CM | POA: Diagnosis not present

## 2018-04-06 DIAGNOSIS — Z6828 Body mass index (BMI) 28.0-28.9, adult: Secondary | ICD-10-CM | POA: Diagnosis not present

## 2018-04-06 DIAGNOSIS — E782 Mixed hyperlipidemia: Secondary | ICD-10-CM | POA: Diagnosis not present

## 2018-04-06 DIAGNOSIS — E663 Overweight: Secondary | ICD-10-CM | POA: Diagnosis not present

## 2018-04-06 DIAGNOSIS — I10 Essential (primary) hypertension: Secondary | ICD-10-CM | POA: Diagnosis not present

## 2018-04-06 DIAGNOSIS — E1121 Type 2 diabetes mellitus with diabetic nephropathy: Secondary | ICD-10-CM | POA: Diagnosis not present

## 2018-04-20 DIAGNOSIS — M5412 Radiculopathy, cervical region: Secondary | ICD-10-CM | POA: Diagnosis not present

## 2018-04-28 DIAGNOSIS — M9904 Segmental and somatic dysfunction of sacral region: Secondary | ICD-10-CM | POA: Diagnosis not present

## 2018-04-28 DIAGNOSIS — M5442 Lumbago with sciatica, left side: Secondary | ICD-10-CM | POA: Diagnosis not present

## 2018-04-28 DIAGNOSIS — M9903 Segmental and somatic dysfunction of lumbar region: Secondary | ICD-10-CM | POA: Diagnosis not present

## 2018-04-28 DIAGNOSIS — M5136 Other intervertebral disc degeneration, lumbar region: Secondary | ICD-10-CM | POA: Diagnosis not present

## 2018-05-10 DIAGNOSIS — M5442 Lumbago with sciatica, left side: Secondary | ICD-10-CM | POA: Diagnosis not present

## 2018-05-10 DIAGNOSIS — M9904 Segmental and somatic dysfunction of sacral region: Secondary | ICD-10-CM | POA: Diagnosis not present

## 2018-05-10 DIAGNOSIS — M9903 Segmental and somatic dysfunction of lumbar region: Secondary | ICD-10-CM | POA: Diagnosis not present

## 2018-05-10 DIAGNOSIS — M5136 Other intervertebral disc degeneration, lumbar region: Secondary | ICD-10-CM | POA: Diagnosis not present

## 2018-05-11 DIAGNOSIS — M4802 Spinal stenosis, cervical region: Secondary | ICD-10-CM | POA: Diagnosis not present

## 2018-05-11 DIAGNOSIS — M5412 Radiculopathy, cervical region: Secondary | ICD-10-CM | POA: Diagnosis not present

## 2018-05-26 DIAGNOSIS — M9903 Segmental and somatic dysfunction of lumbar region: Secondary | ICD-10-CM | POA: Diagnosis not present

## 2018-05-26 DIAGNOSIS — M5136 Other intervertebral disc degeneration, lumbar region: Secondary | ICD-10-CM | POA: Diagnosis not present

## 2018-05-26 DIAGNOSIS — M9904 Segmental and somatic dysfunction of sacral region: Secondary | ICD-10-CM | POA: Diagnosis not present

## 2018-05-26 DIAGNOSIS — M5442 Lumbago with sciatica, left side: Secondary | ICD-10-CM | POA: Diagnosis not present

## 2018-05-31 DIAGNOSIS — M47812 Spondylosis without myelopathy or radiculopathy, cervical region: Secondary | ICD-10-CM | POA: Diagnosis not present

## 2018-05-31 DIAGNOSIS — R200 Anesthesia of skin: Secondary | ICD-10-CM | POA: Diagnosis not present

## 2018-05-31 DIAGNOSIS — R202 Paresthesia of skin: Secondary | ICD-10-CM | POA: Diagnosis not present

## 2018-05-31 DIAGNOSIS — I10 Essential (primary) hypertension: Secondary | ICD-10-CM | POA: Diagnosis not present

## 2018-06-10 DIAGNOSIS — M5412 Radiculopathy, cervical region: Secondary | ICD-10-CM | POA: Diagnosis not present

## 2018-06-10 DIAGNOSIS — G5603 Carpal tunnel syndrome, bilateral upper limbs: Secondary | ICD-10-CM | POA: Diagnosis not present

## 2018-06-21 DIAGNOSIS — Z0000 Encounter for general adult medical examination without abnormal findings: Secondary | ICD-10-CM | POA: Diagnosis not present

## 2018-06-21 DIAGNOSIS — Z6827 Body mass index (BMI) 27.0-27.9, adult: Secondary | ICD-10-CM | POA: Diagnosis not present

## 2018-06-21 DIAGNOSIS — Z1231 Encounter for screening mammogram for malignant neoplasm of breast: Secondary | ICD-10-CM | POA: Diagnosis not present

## 2018-06-22 DIAGNOSIS — M47812 Spondylosis without myelopathy or radiculopathy, cervical region: Secondary | ICD-10-CM | POA: Diagnosis not present

## 2018-06-22 DIAGNOSIS — M542 Cervicalgia: Secondary | ICD-10-CM | POA: Diagnosis not present

## 2018-06-23 DIAGNOSIS — M9903 Segmental and somatic dysfunction of lumbar region: Secondary | ICD-10-CM | POA: Diagnosis not present

## 2018-06-23 DIAGNOSIS — M5442 Lumbago with sciatica, left side: Secondary | ICD-10-CM | POA: Diagnosis not present

## 2018-06-23 DIAGNOSIS — M9904 Segmental and somatic dysfunction of sacral region: Secondary | ICD-10-CM | POA: Diagnosis not present

## 2018-06-23 DIAGNOSIS — M5136 Other intervertebral disc degeneration, lumbar region: Secondary | ICD-10-CM | POA: Diagnosis not present

## 2018-06-28 DIAGNOSIS — G5603 Carpal tunnel syndrome, bilateral upper limbs: Secondary | ICD-10-CM | POA: Diagnosis not present

## 2018-06-28 DIAGNOSIS — I10 Essential (primary) hypertension: Secondary | ICD-10-CM | POA: Diagnosis not present

## 2018-06-28 DIAGNOSIS — Z6827 Body mass index (BMI) 27.0-27.9, adult: Secondary | ICD-10-CM | POA: Diagnosis not present

## 2018-06-30 DIAGNOSIS — J060 Acute laryngopharyngitis: Secondary | ICD-10-CM | POA: Diagnosis not present

## 2018-07-09 DIAGNOSIS — G5601 Carpal tunnel syndrome, right upper limb: Secondary | ICD-10-CM | POA: Diagnosis not present

## 2018-07-21 DIAGNOSIS — Z1231 Encounter for screening mammogram for malignant neoplasm of breast: Secondary | ICD-10-CM | POA: Diagnosis not present

## 2018-07-21 LAB — HM MAMMOGRAPHY

## 2018-07-28 DIAGNOSIS — M5136 Other intervertebral disc degeneration, lumbar region: Secondary | ICD-10-CM | POA: Diagnosis not present

## 2018-07-28 DIAGNOSIS — M9904 Segmental and somatic dysfunction of sacral region: Secondary | ICD-10-CM | POA: Diagnosis not present

## 2018-07-28 DIAGNOSIS — M9903 Segmental and somatic dysfunction of lumbar region: Secondary | ICD-10-CM | POA: Diagnosis not present

## 2018-07-28 DIAGNOSIS — M5442 Lumbago with sciatica, left side: Secondary | ICD-10-CM | POA: Diagnosis not present

## 2018-08-25 DIAGNOSIS — M5442 Lumbago with sciatica, left side: Secondary | ICD-10-CM | POA: Diagnosis not present

## 2018-08-25 DIAGNOSIS — M9904 Segmental and somatic dysfunction of sacral region: Secondary | ICD-10-CM | POA: Diagnosis not present

## 2018-08-25 DIAGNOSIS — M9903 Segmental and somatic dysfunction of lumbar region: Secondary | ICD-10-CM | POA: Diagnosis not present

## 2018-08-25 DIAGNOSIS — M5136 Other intervertebral disc degeneration, lumbar region: Secondary | ICD-10-CM | POA: Diagnosis not present

## 2018-10-01 DIAGNOSIS — E782 Mixed hyperlipidemia: Secondary | ICD-10-CM | POA: Diagnosis not present

## 2018-10-01 DIAGNOSIS — E663 Overweight: Secondary | ICD-10-CM | POA: Diagnosis not present

## 2018-10-01 DIAGNOSIS — E1121 Type 2 diabetes mellitus with diabetic nephropathy: Secondary | ICD-10-CM | POA: Diagnosis not present

## 2018-10-01 DIAGNOSIS — I10 Essential (primary) hypertension: Secondary | ICD-10-CM | POA: Diagnosis not present

## 2018-10-01 DIAGNOSIS — Z1159 Encounter for screening for other viral diseases: Secondary | ICD-10-CM | POA: Diagnosis not present

## 2018-10-01 DIAGNOSIS — Z6828 Body mass index (BMI) 28.0-28.9, adult: Secondary | ICD-10-CM | POA: Diagnosis not present

## 2019-01-05 DIAGNOSIS — M5136 Other intervertebral disc degeneration, lumbar region: Secondary | ICD-10-CM | POA: Diagnosis not present

## 2019-01-05 DIAGNOSIS — M9903 Segmental and somatic dysfunction of lumbar region: Secondary | ICD-10-CM | POA: Diagnosis not present

## 2019-01-05 DIAGNOSIS — M9904 Segmental and somatic dysfunction of sacral region: Secondary | ICD-10-CM | POA: Diagnosis not present

## 2019-01-05 DIAGNOSIS — M5442 Lumbago with sciatica, left side: Secondary | ICD-10-CM | POA: Diagnosis not present

## 2019-01-10 DIAGNOSIS — M9904 Segmental and somatic dysfunction of sacral region: Secondary | ICD-10-CM | POA: Diagnosis not present

## 2019-01-10 DIAGNOSIS — M9903 Segmental and somatic dysfunction of lumbar region: Secondary | ICD-10-CM | POA: Diagnosis not present

## 2019-01-10 DIAGNOSIS — M5136 Other intervertebral disc degeneration, lumbar region: Secondary | ICD-10-CM | POA: Diagnosis not present

## 2019-01-10 DIAGNOSIS — M5442 Lumbago with sciatica, left side: Secondary | ICD-10-CM | POA: Diagnosis not present

## 2019-01-26 DIAGNOSIS — H25813 Combined forms of age-related cataract, bilateral: Secondary | ICD-10-CM | POA: Diagnosis not present

## 2019-01-26 DIAGNOSIS — E119 Type 2 diabetes mellitus without complications: Secondary | ICD-10-CM | POA: Diagnosis not present

## 2019-01-26 DIAGNOSIS — H04203 Unspecified epiphora, bilateral lacrimal glands: Secondary | ICD-10-CM | POA: Diagnosis not present

## 2019-01-26 LAB — HM DIABETES EYE EXAM

## 2019-02-02 DIAGNOSIS — M9904 Segmental and somatic dysfunction of sacral region: Secondary | ICD-10-CM | POA: Diagnosis not present

## 2019-02-02 DIAGNOSIS — M5442 Lumbago with sciatica, left side: Secondary | ICD-10-CM | POA: Diagnosis not present

## 2019-02-02 DIAGNOSIS — M9903 Segmental and somatic dysfunction of lumbar region: Secondary | ICD-10-CM | POA: Diagnosis not present

## 2019-02-02 DIAGNOSIS — M5136 Other intervertebral disc degeneration, lumbar region: Secondary | ICD-10-CM | POA: Diagnosis not present

## 2019-02-08 DIAGNOSIS — Z23 Encounter for immunization: Secondary | ICD-10-CM | POA: Diagnosis not present

## 2019-02-08 DIAGNOSIS — Z6827 Body mass index (BMI) 27.0-27.9, adult: Secondary | ICD-10-CM | POA: Diagnosis not present

## 2019-02-08 DIAGNOSIS — R296 Repeated falls: Secondary | ICD-10-CM | POA: Diagnosis not present

## 2019-02-08 DIAGNOSIS — E663 Overweight: Secondary | ICD-10-CM | POA: Diagnosis not present

## 2019-02-08 DIAGNOSIS — E782 Mixed hyperlipidemia: Secondary | ICD-10-CM | POA: Diagnosis not present

## 2019-02-08 DIAGNOSIS — E1121 Type 2 diabetes mellitus with diabetic nephropathy: Secondary | ICD-10-CM | POA: Diagnosis not present

## 2019-02-08 DIAGNOSIS — I10 Essential (primary) hypertension: Secondary | ICD-10-CM | POA: Diagnosis not present

## 2019-03-02 DIAGNOSIS — M9904 Segmental and somatic dysfunction of sacral region: Secondary | ICD-10-CM | POA: Diagnosis not present

## 2019-03-02 DIAGNOSIS — M9903 Segmental and somatic dysfunction of lumbar region: Secondary | ICD-10-CM | POA: Diagnosis not present

## 2019-03-02 DIAGNOSIS — M5442 Lumbago with sciatica, left side: Secondary | ICD-10-CM | POA: Diagnosis not present

## 2019-03-02 DIAGNOSIS — M5136 Other intervertebral disc degeneration, lumbar region: Secondary | ICD-10-CM | POA: Diagnosis not present

## 2019-03-14 DIAGNOSIS — R944 Abnormal results of kidney function studies: Secondary | ICD-10-CM | POA: Diagnosis not present

## 2019-03-30 DIAGNOSIS — M5442 Lumbago with sciatica, left side: Secondary | ICD-10-CM | POA: Diagnosis not present

## 2019-03-30 DIAGNOSIS — M9903 Segmental and somatic dysfunction of lumbar region: Secondary | ICD-10-CM | POA: Diagnosis not present

## 2019-03-30 DIAGNOSIS — M5136 Other intervertebral disc degeneration, lumbar region: Secondary | ICD-10-CM | POA: Diagnosis not present

## 2019-03-30 DIAGNOSIS — M9904 Segmental and somatic dysfunction of sacral region: Secondary | ICD-10-CM | POA: Diagnosis not present

## 2019-04-06 DIAGNOSIS — N183 Chronic kidney disease, stage 3 unspecified: Secondary | ICD-10-CM | POA: Diagnosis not present

## 2019-04-06 DIAGNOSIS — M1812 Unilateral primary osteoarthritis of first carpometacarpal joint, left hand: Secondary | ICD-10-CM | POA: Diagnosis not present

## 2019-04-06 DIAGNOSIS — M25532 Pain in left wrist: Secondary | ICD-10-CM | POA: Diagnosis not present

## 2019-04-06 DIAGNOSIS — M25542 Pain in joints of left hand: Secondary | ICD-10-CM | POA: Diagnosis not present

## 2019-04-06 DIAGNOSIS — M79642 Pain in left hand: Secondary | ICD-10-CM | POA: Diagnosis not present

## 2019-04-06 DIAGNOSIS — M19032 Primary osteoarthritis, left wrist: Secondary | ICD-10-CM | POA: Diagnosis not present

## 2019-04-06 DIAGNOSIS — N1831 Chronic kidney disease, stage 3a: Secondary | ICD-10-CM | POA: Diagnosis not present

## 2019-04-21 DIAGNOSIS — Z6827 Body mass index (BMI) 27.0-27.9, adult: Secondary | ICD-10-CM | POA: Diagnosis not present

## 2019-04-21 DIAGNOSIS — M79645 Pain in left finger(s): Secondary | ICD-10-CM | POA: Diagnosis not present

## 2019-04-21 DIAGNOSIS — I10 Essential (primary) hypertension: Secondary | ICD-10-CM | POA: Diagnosis not present

## 2019-04-27 DIAGNOSIS — M9903 Segmental and somatic dysfunction of lumbar region: Secondary | ICD-10-CM | POA: Diagnosis not present

## 2019-04-27 DIAGNOSIS — M5442 Lumbago with sciatica, left side: Secondary | ICD-10-CM | POA: Diagnosis not present

## 2019-04-27 DIAGNOSIS — M5136 Other intervertebral disc degeneration, lumbar region: Secondary | ICD-10-CM | POA: Diagnosis not present

## 2019-04-27 DIAGNOSIS — M9904 Segmental and somatic dysfunction of sacral region: Secondary | ICD-10-CM | POA: Diagnosis not present

## 2019-05-25 DIAGNOSIS — M5442 Lumbago with sciatica, left side: Secondary | ICD-10-CM | POA: Diagnosis not present

## 2019-05-25 DIAGNOSIS — M5136 Other intervertebral disc degeneration, lumbar region: Secondary | ICD-10-CM | POA: Diagnosis not present

## 2019-05-25 DIAGNOSIS — M9903 Segmental and somatic dysfunction of lumbar region: Secondary | ICD-10-CM | POA: Diagnosis not present

## 2019-05-25 DIAGNOSIS — M9904 Segmental and somatic dysfunction of sacral region: Secondary | ICD-10-CM | POA: Diagnosis not present

## 2019-05-27 ENCOUNTER — Encounter: Payer: Self-pay | Admitting: Gastroenterology

## 2019-06-03 ENCOUNTER — Encounter: Payer: Self-pay | Admitting: Gastroenterology

## 2019-06-06 ENCOUNTER — Other Ambulatory Visit: Payer: Self-pay

## 2019-06-06 ENCOUNTER — Ambulatory Visit (AMBULATORY_SURGERY_CENTER): Payer: PPO | Admitting: *Deleted

## 2019-06-06 ENCOUNTER — Encounter: Payer: Self-pay | Admitting: Gastroenterology

## 2019-06-06 VITALS — Temp 97.6°F | Ht 63.0 in | Wt 153.6 lb

## 2019-06-06 DIAGNOSIS — Z1159 Encounter for screening for other viral diseases: Secondary | ICD-10-CM

## 2019-06-06 DIAGNOSIS — Z800 Family history of malignant neoplasm of digestive organs: Secondary | ICD-10-CM

## 2019-06-06 NOTE — Progress Notes (Signed)
Pt is aware that care partner will wait in the car during procedure; if they feel like they will be too hot or cold to wait in the car; they may wait in the 4 th floor lobby. Patient is aware to bring only one care partner. We want them to wear a mask (we do not have any that we can provide them), practice social distancing, and we will check their temperatures when they get here.  I did remind the patient that their care partner needs to stay in the parking lot the entire time and have a cell phone available, we will call them when the pt is ready for discharge. Patient will wear mask into building.  No egg or soy allergy  No home oxygen use or problems with anesthesia  No medications for weight loss taken  emmi information given  covid test- 06-13-19 at 900

## 2019-06-13 ENCOUNTER — Other Ambulatory Visit: Payer: Self-pay | Admitting: Gastroenterology

## 2019-06-13 ENCOUNTER — Ambulatory Visit (INDEPENDENT_AMBULATORY_CARE_PROVIDER_SITE_OTHER): Payer: PPO

## 2019-06-13 DIAGNOSIS — Z1159 Encounter for screening for other viral diseases: Secondary | ICD-10-CM | POA: Diagnosis not present

## 2019-06-13 LAB — SARS CORONAVIRUS 2 (TAT 6-24 HRS): SARS Coronavirus 2: NEGATIVE

## 2019-06-15 ENCOUNTER — Ambulatory Visit (AMBULATORY_SURGERY_CENTER): Payer: PPO | Admitting: Gastroenterology

## 2019-06-15 ENCOUNTER — Encounter: Payer: Self-pay | Admitting: Gastroenterology

## 2019-06-15 ENCOUNTER — Other Ambulatory Visit: Payer: Self-pay

## 2019-06-15 VITALS — BP 115/58 | HR 78 | Temp 99.0°F | Resp 15 | Ht 63.0 in | Wt 153.0 lb

## 2019-06-15 DIAGNOSIS — R197 Diarrhea, unspecified: Secondary | ICD-10-CM

## 2019-06-15 DIAGNOSIS — K529 Noninfective gastroenteritis and colitis, unspecified: Secondary | ICD-10-CM

## 2019-06-15 DIAGNOSIS — Z800 Family history of malignant neoplasm of digestive organs: Secondary | ICD-10-CM

## 2019-06-15 MED ORDER — FLEET ENEMA 7-19 GM/118ML RE ENEM
1.0000 | ENEMA | Freq: Once | RECTAL | Status: AC
  Administered 2019-06-15: 09:00:00 1 via RECTAL

## 2019-06-15 MED ORDER — CHOLESTYRAMINE 4 G PO PACK: 4.0000 g | PACK | Freq: Every day | ORAL | 4 refills | Status: DC

## 2019-06-15 MED ORDER — SODIUM CHLORIDE 0.9 % IV SOLN: 500.0000 mL | INTRAVENOUS | Status: DC

## 2019-06-15 NOTE — Op Note (Signed)
[facility] Patient Name: Alicia Jordan Procedure Date: 06/15/2019 9:32 AM MRN: [id number] Endoscopist: Jackquline Denmark , MD Age: 73 Referring MD:  Date of Birth: Oct 05, 1945 Gender: Female Account #: [id number] Procedure:                Colonoscopy Indications:              1. High risk colorectal cancer screening. 2. FH                            colon cancer (daughter at age 71). 3. Intermittent                            diarrhea. Medicines:                Monitored Anesthesia Care Procedure:                Pre-Anesthesia Assessment:                           - Prior to the procedure, a History and Physical                            was performed, and patient medications and                            allergies were reviewed. The patient's tolerance of                            previous anesthesia was also reviewed. The risks                            and benefits of the procedure and the sedation                            options and risks were discussed with the patient.                            All questions were answered, and informed consent                            was obtained. Prior Anticoagulants: The patient has                            taken no previous anticoagulant or antiplatelet                            agents. ASA Grade Assessment: II - A patient with                            mild systemic disease. After reviewing the risks                            and benefits, the patient was deemed in  satisfactory condition to undergo the procedure.                           After obtaining informed consent, the colonoscope                            was passed under direct vision. Throughout the                            procedure, the patient's blood pressure, pulse, and                            oxygen saturations were monitored continuously. The                            Colonoscope was introduced through the anus and                            advanced to the 4 cm into the ileum. The                            colonoscopy was performed without difficulty. The                            patient tolerated the procedure well. The quality                            of the bowel preparation was good. The terminal                            ileum, ileocecal valve, appendiceal orifice, and                            rectum were photographed. Scope In: 9:41:40 AM Scope Out: 9:54:59 AM Scope Withdrawal Time: 0 hours 8 minutes 32 seconds  Total Procedure Duration: 0 hours 13 minutes 19 seconds  Findings:                 The colon (entire examined portion) appeared                            normal. Biopsies for histology were taken with a                            cold forceps from the entire colon for evaluation                            of microscopic colitis.                           A few small-mouthed diverticula were found in the                            sigmoid colon.  Non-bleeding internal hemorrhoids were found during                            retroflexion. The hemorrhoids were moderate.                           The terminal ileum appeared normal.                           The exam was otherwise without abnormality on                            direct and retroflexion views. Complications:            No immediate complications. Estimated Blood Loss:     Estimated blood loss: none. Impression:               - Mild sigmoid diverticulosis.                           - Non-bleeding internal hemorrhoids.                           - Otherwise normal colonoscopy to TI. Recommendation:           - Patient has a contact number available for                            emergencies. The signs and symptoms of potential                            delayed complications were discussed with the                            patient. Return to normal activities tomorrow.                             Written discharge instructions were provided to the                            patient.                           - Resume previous diet.                           - Continue present medications.                           - Await pathology results.                           - Repeat colonoscopy for surveillance based on                            pathology results.                           -  Use generic Questran at 1 packet (4 grams) PO                            daily for 4 weeks, 4 refills. Please take it 2                            hours before or after rest of the medications. Can                            put it in applesauce.                           - Can take Imodium on as-needed basis, max 4/day.                           - Return to GI clinic in 12 weeks.                           - D/W Ilene Qua, MD 06/15/2019 10:02:46 AM This report has been signed electronically.

## 2019-06-15 NOTE — Progress Notes (Signed)
Temp JB  vs CW  I have reviewed the patient's medical history in detail and updated the computerized patient record.

## 2019-06-15 NOTE — Progress Notes (Signed)
Report given to PACU, vss 

## 2019-06-15 NOTE — Progress Notes (Signed)
Pt stated her stool was dark brown with particles floating everywhere. Dr. Lyndel Safe notified and fleets enema ordered. Fleets given and outcome was yellow per patient.

## 2019-06-15 NOTE — Patient Instructions (Signed)
YOU HAD AN ENDOSCOPIC PROCEDURE TODAY AT THE [facility]:   Refer to the procedure report that was given to you for any specific questions about what was found during the examination.  If the procedure report does not answer your questions, please call your gastroenterologist to clarify.  If you requested that your care partner not be given the details of your procedure findings, then the procedure report has been included in a sealed envelope for you to review at your convenience later.  YOU SHOULD EXPECT: Some feelings of bloating in the abdomen. Passage of more gas than usual.  Walking can help get rid of the air that was put into your GI tract during the procedure and reduce the bloating. If you had a lower endoscopy (such as a colonoscopy or flexible sigmoidoscopy) you may notice spotting of blood in your stool or on the toilet paper. If you underwent a bowel prep for your procedure, you may not have a normal bowel movement for a few days.  Please Note:  You might notice some irritation and congestion in your nose or some drainage.  This is from the oxygen used during your procedure.  There is no need for concern and it should clear up in a day or so.  SYMPTOMS TO REPORT IMMEDIATELY:   Following lower endoscopy (colonoscopy or flexible sigmoidoscopy):  Excessive amounts of blood in the stool  Significant tenderness or worsening of abdominal pains  Swelling of the abdomen that is new, acute  Fever of 100F or higher  For urgent or emergent issues, a gastroenterologist can be reached at any hour by calling [phone].   DIET:  We do recommend a small meal at first, but then you may proceed to your regular diet.  Drink plenty of fluids but you should avoid alcoholic beverages for 24 hours.  ACTIVITY:  You should plan to take it easy for the rest of today and you should NOT DRIVE or use heavy machinery until tomorrow (because of the sedation medicines used during the test).     FOLLOW UP: Our staff will call the number listed on your records 48-72 hours following your procedure to check on you and address any questions or concerns that you may have regarding the information given to you following your procedure. If we do not reach you, we will leave a message.  We will attempt to reach you two times.  During this call, we will ask if you have developed any symptoms of COVID 19. If you develop any symptoms (ie: fever, flu-like symptoms, shortness of breath, cough etc.) before then, please call [phone].  If you test positive for Covid 19 in the 2 weeks post procedure, please call and report this information to us.    If any biopsies were taken you will be contacted by phone or by letter within the next 1-3 weeks.  Please call us at [phone] if you have not heard about the biopsies in 3 weeks.    SIGNATURES/CONFIDENTIALITY: You and/or your care partner have signed paperwork which will be entered into your electronic medical record.  These signatures attest to the fact that that the information above on your After Visit Summary has been reviewed and is understood.  Full responsibility of the confidentiality of this discharge information lies with you and/or your care-partner. 

## 2019-06-15 NOTE — Progress Notes (Signed)
Called to room to assist during endoscopic procedure.  Patient ID and intended procedure confirmed with present staff. Received instructions for my participation in the procedure from the performing physician.  

## 2019-06-20 ENCOUNTER — Telehealth: Payer: Self-pay

## 2019-06-20 NOTE — Telephone Encounter (Signed)
  Follow up Call-  Call back number 06/15/2019  Post procedure Call Back phone  # [phone]  Permission to leave phone message Yes  Some recent data might be hidden     Patient questions:  Do you have a fever, pain , or abdominal swelling? No. Pain Score  0 *  Have you tolerated food without any problems? Yes.    Have you been able to return to your normal activities? Yes.    Do you have any questions about your discharge instructions: Diet   No. Medications  No. Follow up visit  No.  Do you have questions or concerns about your Care? No.  Actions: * If pain score is 4 or above: No action needed, pain <4.  1. Have you developed a fever since your procedure? no  2.   Have you had an respiratory symptoms (SOB or cough) since your procedure? no  3.   Have you tested positive for COVID 19 since your procedure no  4.   Have you had any family members/close contacts diagnosed with the COVID 19 since your procedure?  no   If yes to any of these questions please route to Joylene John, RN and Alphonsa Gin, Therapist, sports.

## 2019-06-22 DIAGNOSIS — M5136 Other intervertebral disc degeneration, lumbar region: Secondary | ICD-10-CM | POA: Diagnosis not present

## 2019-06-22 DIAGNOSIS — M9904 Segmental and somatic dysfunction of sacral region: Secondary | ICD-10-CM | POA: Diagnosis not present

## 2019-06-22 DIAGNOSIS — M5442 Lumbago with sciatica, left side: Secondary | ICD-10-CM | POA: Diagnosis not present

## 2019-06-22 DIAGNOSIS — M9903 Segmental and somatic dysfunction of lumbar region: Secondary | ICD-10-CM | POA: Diagnosis not present

## 2019-06-28 ENCOUNTER — Encounter: Payer: Self-pay | Admitting: Gastroenterology

## 2019-07-19 ENCOUNTER — Telehealth (INDEPENDENT_AMBULATORY_CARE_PROVIDER_SITE_OTHER): Payer: PPO | Admitting: Nurse Practitioner

## 2019-07-19 ENCOUNTER — Encounter: Payer: Self-pay | Admitting: Nurse Practitioner

## 2019-07-19 VITALS — Temp 98.4°F

## 2019-07-19 DIAGNOSIS — R531 Weakness: Secondary | ICD-10-CM | POA: Diagnosis not present

## 2019-07-19 DIAGNOSIS — R197 Diarrhea, unspecified: Secondary | ICD-10-CM

## 2019-07-19 DIAGNOSIS — R05 Cough: Secondary | ICD-10-CM

## 2019-07-19 DIAGNOSIS — R059 Cough, unspecified: Secondary | ICD-10-CM

## 2019-07-19 DIAGNOSIS — R5383 Other fatigue: Secondary | ICD-10-CM | POA: Diagnosis not present

## 2019-07-19 DIAGNOSIS — R1110 Vomiting, unspecified: Secondary | ICD-10-CM

## 2019-07-19 LAB — POC COVID19 BINAXNOW: SARS Coronavirus 2 Ag: NEGATIVE

## 2019-07-19 MED ORDER — DM-GUAIFENESIN ER 30-600 MG PO TB12: 1.0000 | ORAL_TABLET | Freq: Two times a day (BID) | ORAL | 0 refills | Status: DC

## 2019-07-19 NOTE — Telephone Encounter (Signed)
Patient c/o vomiting, diarrhea, fatigue, weakness, and coughing up phlegm in the last two days. Pt denies sore throat, fever, headache and abdominal pain. Provider advised pt to come by the office for a covid-19 rapid testing and increase hydration, Mucinex for cough will be sent to pharmacy.

## 2019-07-19 NOTE — Telephone Encounter (Signed)
Patient c/o vomiting, diarrhea, fatigue, weakness, and coughing up phlegm in the last 2 days. Pt denies sore throat, fever, headache and abdominal pain. Provider advised pt to come by the office to get a Covid-19 rapid test done. Pt is to increase fluid for hydration. Mucinex for cough sent to pharmacy. Pt is advised to call if symptoms get worse or develops fever.

## 2019-07-20 ENCOUNTER — Telehealth: Payer: Self-pay

## 2019-07-20 LAB — NOVEL CORONAVIRUS, NAA: SARS-CoV-2, NAA: NOT DETECTED

## 2019-07-20 NOTE — Telephone Encounter (Signed)
Patient was informed.

## 2019-07-20 NOTE — Progress Notes (Signed)
Negative covid test lp

## 2019-07-20 NOTE — Progress Notes (Signed)
Negative covid lp

## 2019-07-21 ENCOUNTER — Telehealth: Payer: Self-pay

## 2019-07-21 NOTE — Telephone Encounter (Signed)
Patient was informed.

## 2019-08-08 DIAGNOSIS — S299XXA Unspecified injury of thorax, initial encounter: Secondary | ICD-10-CM | POA: Diagnosis not present

## 2019-08-08 DIAGNOSIS — R0781 Pleurodynia: Secondary | ICD-10-CM | POA: Diagnosis not present

## 2019-08-08 DIAGNOSIS — S4991XA Unspecified injury of right shoulder and upper arm, initial encounter: Secondary | ICD-10-CM | POA: Diagnosis not present

## 2019-08-08 DIAGNOSIS — M25511 Pain in right shoulder: Secondary | ICD-10-CM | POA: Diagnosis not present

## 2019-08-10 ENCOUNTER — Encounter: Payer: Self-pay | Admitting: Family Medicine

## 2019-08-11 ENCOUNTER — Other Ambulatory Visit: Payer: Self-pay

## 2019-08-11 ENCOUNTER — Ambulatory Visit (INDEPENDENT_AMBULATORY_CARE_PROVIDER_SITE_OTHER): Payer: PPO | Admitting: Family Medicine

## 2019-08-11 ENCOUNTER — Encounter: Payer: Self-pay | Admitting: Family Medicine

## 2019-08-11 VITALS — BP 122/68 | HR 78 | Temp 98.0°F | Ht 62.0 in | Wt 149.0 lb

## 2019-08-11 DIAGNOSIS — E1169 Type 2 diabetes mellitus with other specified complication: Secondary | ICD-10-CM | POA: Diagnosis not present

## 2019-08-11 DIAGNOSIS — E785 Hyperlipidemia, unspecified: Secondary | ICD-10-CM | POA: Diagnosis not present

## 2019-08-11 DIAGNOSIS — E1122 Type 2 diabetes mellitus with diabetic chronic kidney disease: Secondary | ICD-10-CM | POA: Insufficient documentation

## 2019-08-11 DIAGNOSIS — E1159 Type 2 diabetes mellitus with other circulatory complications: Secondary | ICD-10-CM | POA: Insufficient documentation

## 2019-08-11 DIAGNOSIS — I10 Essential (primary) hypertension: Secondary | ICD-10-CM | POA: Insufficient documentation

## 2019-08-11 DIAGNOSIS — M25511 Pain in right shoulder: Secondary | ICD-10-CM

## 2019-08-11 DIAGNOSIS — N183 Chronic kidney disease, stage 3 unspecified: Secondary | ICD-10-CM | POA: Insufficient documentation

## 2019-08-11 DIAGNOSIS — E782 Mixed hyperlipidemia: Secondary | ICD-10-CM | POA: Diagnosis not present

## 2019-08-11 DIAGNOSIS — I152 Hypertension secondary to endocrine disorders: Secondary | ICD-10-CM | POA: Insufficient documentation

## 2019-08-11 DIAGNOSIS — K219 Gastro-esophageal reflux disease without esophagitis: Secondary | ICD-10-CM | POA: Diagnosis not present

## 2019-08-11 LAB — POCT UA - MICROALBUMIN: Microalbumin Ur, POC: 80 mg/L

## 2019-08-11 NOTE — Patient Instructions (Signed)
Medical problems well-controlled.  No change to medications.  Continue healthy diet and exercise.  Follow-up in 3 months. Recommend call and get scheduled for COVID-19 vaccine.

## 2019-08-11 NOTE — Progress Notes (Signed)
Subjective:  Patient ID: Alicia Jordan, female    DOB: [birth year]  Age: 74 y.o. MRN: [id number]  Chief Complaint  Patient presents with  . Hypertension  . Hyperlipidemia  . Diabetes    FBS this a.m. was 152 per patient  . Fall    Patient fell on Monday at home, tripped over the dog bed, Was seen at the ER, has a f/u with Dr. Samule Dry on Monday at 9:15a.m. No broken bones but concerns about her rotator cuff.    HPI Patient is a 74 year old white female with history of diabetes, hypertension, and hyperlipidemia who presents for routine follow-up.  The patient eats healthy.  She exercises.  She checks her sugars daily and they run 140-150s.  She denies any polydipsia, polyphagia, or polyuria.  She is tolerating all of her medications.  She is compliant with taking her medications. She checks her feet daily and her last eye appointment was 01/2019. Of note, she did have a fall this past Monday.  She fell on her right side due to tripping over the dog bed.  She is using Tylenol 1-2 up to 4 times a day and ice.  She has made herself an appointment with Dr. Samule Dry because she is concerned about her rotator cuff.  She is unable to take NSAIDs because of her chronic kidney disease.  In addition she does report that 2 weeks ago she had an episode of vomiting and diarrhea with coughing up some phlegm.  This was accompanied by weakness.  She was tested for Covid at that time and it was negative she took Mucinex DM and did not require any antibiotics she does have some phlegm remaining but otherwise is back to normal. Social History   Socioeconomic History  . Marital status: Married    Spouse name: Not on file  . Number of children: 4  . Years of education: Not on file  . Highest education level: Not on file  Occupational History  . Occupation: Homemaker  Tobacco Use  . Smoking status: Never Smoker  . Smokeless tobacco: Never Used  Substance and Sexual Activity  . Alcohol use: Never  . Drug use:  Never  . Sexual activity: Not on file  Other Topics Concern  . Not on file  Social History Narrative  . Not on file   Social Determinants of Health   Financial Resource Strain:   . Difficulty of Paying Living Expenses: Not on file  Food Insecurity:   . Worried About Charity fundraiser in the Last Year: Not on file  . Ran Out of Food in the Last Year: Not on file  Transportation Needs:   . Lack of Transportation (Medical): Not on file  . Lack of Transportation (Non-Medical): Not on file  Physical Activity:   . Days of Exercise per Week: Not on file  . Minutes of Exercise per Session: Not on file  Stress:   . Feeling of Stress : Not on file  Social Connections:   . Frequency of Communication with Friends and Family: Not on file  . Frequency of Social Gatherings with Friends and Family: Not on file  . Attends Religious Services: Not on file  . Active Member of Clubs or Organizations: Not on file  . Attends Archivist Meetings: Not on file  . Marital Status: Not on file   Past Medical History:  Diagnosis Date  . Allergy   . Anxiety   . Arthralgia of left temporomandibular joint   .  Arthritis   . Cervicalgia   . Chronic kidney disease, stage 3a   . Depression   . Diabetes mellitus without complication ([place])   . Hesitancy of micturition   . Hyperlipidemia   . Hypertension   . Osteoporosis   . Other psoriasis   . Other specified menopausal and perimenopausal disorders   . Overweight   . Repeated falls   . Residual hemorrhoidal skin tags    Family History  Problem Relation Age of Onset  . Colon cancer Daughter   . Diabetes type II Mother   . Hyperlipidemia Mother   . Hypertension Mother   . Heart attack Father   . Hyperlipidemia Father   . Hypertension Father   . Stroke Father   . Diabetes type II Father   . Esophageal cancer Neg Hx   . Rectal cancer Neg Hx   . Stomach cancer Neg Hx     Review of Systems  Constitutional: Negative for chills,  fatigue and fever.  HENT: Negative for congestion, ear pain, rhinorrhea, sinus pressure and sore throat.   Respiratory: Negative for cough and shortness of breath.   Cardiovascular: Negative for chest pain.  Gastrointestinal: Negative for abdominal pain, constipation, diarrhea, nausea and vomiting.  Endocrine: Negative for polydipsia and polyphagia.  Genitourinary: Negative for dysuria.  Musculoskeletal: Negative for myalgias.  Neurological: Negative for dizziness and headaches.  Psychiatric/Behavioral: Negative for dysphoric mood. The patient is not nervous/anxious.    Review of Systems  Constitutional: Negative for chills, diaphoresis, fever and malaise/fatigue.  HENT: Negative for congestion, ear pain and sore throat.   Respiratory: Negative for cough and sputum production.   Cardiovascular: Negative for chest pain and palpitations.  Gastrointestinal: Negative for abdominal pain, constipation, diarrhea, nausea and vomiting.  Genitourinary: Negative for dysuria and urgency.  Musculoskeletal: Negative for myalgias. Negative for arthralgias. Skin: no abnormal moles or rashes. Neurological: Negative for dizziness and headaches.  Psychiatric/Behavioral: Negative for depression. Negative for anhedonia. Negative for anxiety.     Objective:  BP 122/68 (BP Location: Left Arm, Patient Position: Sitting)   Pulse 78   Temp 98 F (36.7 C) (Temporal)   Ht 5\' 2"  (1.575 m)   Wt 149 lb (67.6 kg)   SpO2 99%   BMI 27.25 kg/m   BP/Weight 08/16/2019 08/11/2019 [id number]  Systolic BP [id number]  Diastolic BP 62 68 58  Wt. (Lbs) 151.4 149 153  BMI 27.69 27.25 27.1    Physical Exam Constitutional:      Appearance: Normal appearance. She is not ill-appearing.  HENT:     Right Ear: Tympanic membrane normal.     Left Ear: Tympanic membrane normal.     Nose: Nose normal.     Mouth/Throat:     Mouth: Mucous membranes are moist.  Neck:     Vascular: No carotid bruit.  Cardiovascular:      Rate and Rhythm: Normal rate and regular rhythm.     Pulses: Normal pulses.     Heart sounds: Normal heart sounds.  Pulmonary:     Effort: Pulmonary effort is normal.     Breath sounds: Normal breath sounds.  Abdominal:     General: Bowel sounds are normal.     Palpations: Abdomen is soft.     Tenderness: There is no abdominal tenderness.  Musculoskeletal:     Comments: [place] on right side on Monday. No fxs. Patient reports taking tylenol for the past 6 days, no NSAIDS due to CKD and icing her shoulder.  Upcoming appt with Dr. Samule Dry concerned about rotator cuff.  Skin:    General: Skin is warm.  Neurological:     Mental Status: She is alert.  Psychiatric:        Mood and Affect: Mood normal.        Behavior: Behavior normal.       Physical Exam Vitals reviewed.  Constitutional:      Appearance: Normal appearance.  Neck:     Vascular: No carotid bruit.  Cardiovascular:     Rate and Rhythm: Normal rate and regular rhythm.     Pulses: Normal pulses.     Heart sounds: Normal heart sounds.  Pulmonary: Tender over lower right rib cage.    Effort: Pulmonary effort is normal.     Breath sounds: Normal breath sounds. No wheezing, rhonchi or rales.  Abdominal:     General: Bowel sounds are normal.     Palpations: Abdomen is soft.     Tenderness: There is no abdominal tenderness.  Neurological:     Mental Status: ALERT Psychiatric:        Mood and Affect: Mood normal.        Behavior: Behavior normal.  MS: Poor ROM of right shoulder in all directions. Tender over right shoulder anteriorly and superiorly. Knees nontender.  Foot exam normal.   Lab Results  Component Value Date   WBC 7.0 08/11/2019   HGB 11.9 08/11/2019   HCT 36.7 08/11/2019   PLT 234 08/11/2019   GLUCOSE 138 (H) 08/11/2019   CHOL 146 08/11/2019   TRIG 167 (H) 08/11/2019   HDL 50 08/11/2019   LDLCALC 68 08/11/2019   AST 13 08/11/2019   NA 143 08/11/2019   K 4.4 08/11/2019   CL 99 08/11/2019    CREATININE 1.23 (H) 08/11/2019   BUN 18 08/11/2019   TSH 3.170 08/11/2019   HGBA1C 6.6 (H) 08/11/2019   MICROALBUR 80 08/11/2019      Assessment & Plan:  Essential hypertension, benign Well controlled.  No changes to medicines.  Continue to work on eating a healthy diet and exercise.  Labs drawn today.   GERD without esophagitis Well controlled.  No changes to medicines.    Dyslipidemia associated with type 2 diabetes mellitus ([place]) Well controlled.  No changes to medicines.  Continue to work on eating a healthy diet and exercise.  Labs drawn today. Microalbumin 80 today. Continue to check sugars and feet daily.   Mixed hyperlipidemia Well controlled.  No changes to medicines.  Continue to work on eating a healthy diet and exercise.  Labs drawn today.   Acute pain of right shoulder Keep appt with Dr. Samule Dry. I did not perform a significant examination today since she has an appt this with Dr. Samule Dry.      Follow-up: Return in about 3 months (around 11/08/2019) for fasting and schedule annual wellness visit with Maudie Mercury. .  AVS was given to patient prior to departure.  [name] [facility] [phone]

## 2019-08-12 LAB — CBC WITH DIFFERENTIAL/PLATELET
Basophils Absolute: 0.1 10*3/uL (ref 0.0–0.2)
Basos: 1 %
EOS (ABSOLUTE): 0.1 10*3/uL (ref 0.0–0.4)
Eos: 2 %
Hematocrit: 36.7 % (ref 34.0–46.6)
Hemoglobin: 11.9 g/dL (ref 11.1–15.9)
Immature Grans (Abs): 0 10*3/uL (ref 0.0–0.1)
Immature Granulocytes: 0 %
Lymphocytes Absolute: 1.8 10*3/uL (ref 0.7–3.1)
Lymphs: 26 %
MCH: 28.3 pg (ref 26.6–33.0)
MCHC: 32.4 g/dL (ref 31.5–35.7)
MCV: 87 fL (ref 79–97)
Monocytes Absolute: 0.4 10*3/uL (ref 0.1–0.9)
Monocytes: 6 %
Neutrophils Absolute: 4.6 10*3/uL (ref 1.4–7.0)
Neutrophils: 65 %
Platelets: 234 10*3/uL (ref 150–450)
RBC: 4.2 x10E6/uL (ref 3.77–5.28)
RDW: 13.7 % (ref 11.7–15.4)
WBC: 7 10*3/uL (ref 3.4–10.8)

## 2019-08-12 LAB — COMP. METABOLIC PANEL (12)
AST: 13 IU/L (ref 0–40)
Albumin/Globulin Ratio: 2.2 (ref 1.2–2.2)
Albumin: 4.8 g/dL — ABNORMAL HIGH (ref 3.7–4.7)
Alkaline Phosphatase: 65 IU/L (ref 39–117)
BUN/Creatinine Ratio: 15 (ref 12–28)
BUN: 18 mg/dL (ref 8–27)
Bilirubin Total: 0.8 mg/dL (ref 0.0–1.2)
Calcium: 9.9 mg/dL (ref 8.7–10.3)
Chloride: 99 mmol/L (ref 96–106)
Creatinine, Ser: 1.23 mg/dL — ABNORMAL HIGH (ref 0.57–1.00)
GFR calc Af Amer: 51 mL/min/{1.73_m2} — ABNORMAL LOW (ref >59)
GFR calc non Af Amer: 44 mL/min/{1.73_m2} — ABNORMAL LOW (ref >59)
Globulin, Total: 2.2 g/dL (ref 1.5–4.5)
Glucose: 138 mg/dL — ABNORMAL HIGH (ref 65–99)
Potassium: 4.4 mmol/L (ref 3.5–5.2)
Sodium: 143 mmol/L (ref 134–144)
Total Protein: 7 g/dL (ref 6.0–8.5)

## 2019-08-12 LAB — HEMOGLOBIN A1C
Est. average glucose Bld gHb Est-mCnc: 143 mg/dL
Hgb A1c MFr Bld: 6.6 % — ABNORMAL HIGH (ref 4.8–5.6)

## 2019-08-12 LAB — LIPID PANEL
Chol/HDL Ratio: 2.9 ratio (ref 0.0–4.4)
Cholesterol, Total: 146 mg/dL (ref 100–199)
HDL: 50 mg/dL (ref >39)
LDL Chol Calc (NIH): 68 mg/dL (ref 0–99)
Triglycerides: 167 mg/dL — ABNORMAL HIGH (ref 0–149)
VLDL Cholesterol Cal: 28 mg/dL (ref 5–40)

## 2019-08-12 LAB — CARDIOVASCULAR RISK ASSESSMENT

## 2019-08-12 LAB — TSH: TSH: 3.17 u[IU]/mL (ref 0.450–4.500)

## 2019-08-15 DIAGNOSIS — M25551 Pain in right hip: Secondary | ICD-10-CM | POA: Diagnosis not present

## 2019-08-16 ENCOUNTER — Other Ambulatory Visit: Payer: Self-pay

## 2019-08-16 ENCOUNTER — Ambulatory Visit (INDEPENDENT_AMBULATORY_CARE_PROVIDER_SITE_OTHER): Payer: PPO

## 2019-08-16 VITALS — BP 112/62 | HR 87 | Temp 97.8°F | Resp 16 | Ht 62.0 in | Wt 151.4 lb

## 2019-08-16 DIAGNOSIS — Z0000 Encounter for general adult medical examination without abnormal findings: Secondary | ICD-10-CM

## 2019-08-16 DIAGNOSIS — M8580 Other specified disorders of bone density and structure, unspecified site: Secondary | ICD-10-CM

## 2019-08-16 DIAGNOSIS — Z1231 Encounter for screening mammogram for malignant neoplasm of breast: Secondary | ICD-10-CM | POA: Diagnosis not present

## 2019-08-16 NOTE — Patient Instructions (Signed)
 Fall Prevention in the Home, Adult Falls can cause injuries. They can happen to people of all ages. There are many things you can do to make your home safe and to help prevent falls. Ask for help when making these changes, if needed. What actions can I take to prevent falls? General Instructions  Use good lighting in all rooms. Replace any light bulbs that burn out.  Turn on the lights when you go into a dark area. Use night-lights.  Keep items that you use often in easy-to-reach places. Lower the shelves around your home if necessary.  Set up your furniture so you have a clear path. Avoid moving your furniture around.  Do not have throw rugs and other things on the floor that can make you trip.  Avoid walking on wet floors.  If any of your floors are uneven, fix them.  Add color or contrast paint or tape to clearly mark and help you see: ? Any grab bars or handrails. ? First and last steps of stairways. ? Where the edge of each step is.  If you use a stepladder: ? Make sure that it is fully opened. Do not climb a closed stepladder. ? Make sure that both sides of the stepladder are locked into place. ? Ask someone to hold the stepladder for you while you use it.  If there are any pets around you, be aware of where they are. What can I do in the bathroom?      Keep the floor dry. Clean up any water that spills onto the floor as soon as it happens.  Remove soap buildup in the tub or shower regularly.  Use non-skid mats or decals on the floor of the tub or shower.  Attach bath mats securely with double-sided, non-slip rug tape.  If you need to sit down in the shower, use a plastic, non-slip stool.  Install grab bars by the toilet and in the tub and shower. Do not use towel bars as grab bars. What can I do in the bedroom?  Make sure that you have a light by your bed that is easy to reach.  Do not use any sheets or blankets that are too big for your bed. They should  not hang down onto the floor.  Have a firm chair that has side arms. You can use this for support while you get dressed. What can I do in the kitchen?  Clean up any spills right away.  If you need to reach something above you, use a strong step stool that has a grab bar.  Keep electrical cords out of the way.  Do not use floor polish or wax that makes floors slippery. If you must use wax, use non-skid floor wax. What can I do with my stairs?  Do not leave any items on the stairs.  Make sure that you have a light switch at the top of the stairs and the bottom of the stairs. If you do not have them, ask someone to add them for you.  Make sure that there are handrails on both sides of the stairs, and use them. Fix handrails that are broken or loose. Make sure that handrails are as long as the stairways.  Install non-slip stair treads on all stairs in your home.  Avoid having throw rugs at the top or bottom of the stairs. If you do have throw rugs, attach them to the floor with carpet tape.  Choose a carpet that   does not hide the edge of the steps on the stairway.  Check any carpeting to make sure that it is firmly attached to the stairs. Fix any carpet that is loose or worn. What can I do on the outside of my home?  Use bright outdoor lighting.  Regularly fix the edges of walkways and driveways and fix any cracks.  Remove anything that might make you trip as you walk through a door, such as a raised step or threshold.  Trim any bushes or trees on the path to your home.  Regularly check to see if handrails are loose or broken. Make sure that both sides of any steps have handrails.  Install guardrails along the edges of any raised decks and porches.  Clear walking paths of anything that might make someone trip, such as tools or rocks.  Have any leaves, snow, or ice cleared regularly.  Use sand or salt on walking paths during winter.  Clean up any spills in your garage right  away. This includes grease or oil spills. What other actions can I take?  Wear shoes that: ? Have a low heel. Do not wear high heels. ? Have rubber bottoms. ? Are comfortable and fit you well. ? Are closed at the toe. Do not wear open-toe sandals.  Use tools that help you move around (mobility aids) if they are needed. These include: ? Canes. ? Walkers. ? Scooters. ? Crutches.  Review your medicines with your doctor. Some medicines can make you feel dizzy. This can increase your chance of falling. Ask your doctor what other things you can do to help prevent falls. Where to find more information  Centers for Disease Control and Prevention, STEAD[employer]: https://cdc.gov  National Institute on Aging: https://go4life.nia.nih.gov Contact a doctor if:  You are afraid of falling at home.  You feel weak, drowsy, or dizzy at home.  You fall at home. Summary  There are many simple things that you can do to make your home safe and to help prevent falls.  Ways to make your home safe include removing tripping hazards and installing grab bars in the bathroom.  Ask for help when making these changes in your home. This information is not intended to replace advice given to you by your health care provider. Make sure you discuss any questions you have with your health care provider. Document Revised: 09/23/2018 Document Reviewed: 01/15/2017 Elsevier Patient Education  2020 Elsevier Inc.   Health Maintenance, Female Adopting a healthy lifestyle and getting preventive care are important in promoting health and wellness. Ask your health care provider about:  The right schedule for you to have regular tests and exams.  Things you can do on your own to prevent diseases and keep yourself healthy. What should I know about diet, weight, and exercise? Eat a healthy diet   Eat a diet that includes plenty of vegetables, fruits, low-fat dairy products, and lean protein.  Do not eat a lot of foods  that are high in solid fats, added sugars, or sodium. Maintain a healthy weight Body mass index (BMI) is used to identify weight problems. It estimates body fat based on height and weight. Your health care provider can help determine your BMI and help you achieve or maintain a healthy weight. Get regular exercise Get regular exercise. This is one of the most important things you can do for your health. Most adults should:  Exercise for at least 150 minutes each week. The exercise should increase your heart rate   and make you sweat (moderate-intensity exercise).  Do strengthening exercises at least twice a week. This is in addition to the moderate-intensity exercise.  Spend less time sitting. Even light physical activity can be beneficial. Watch cholesterol and blood lipids Have your blood tested for lipids and cholesterol at 72 years of age, then have this test every 5 years. Have your cholesterol levels checked more often if:  Your lipid or cholesterol levels are high.  You are older than 72 years of age.  You are at high risk for heart disease. What should I know about cancer screening? Depending on your health history and family history, you may need to have cancer screening at various ages. This may include screening for:  Breast cancer.  Cervical cancer.  Colorectal cancer.  Skin cancer.  Lung cancer. What should I know about heart disease, diabetes, and high blood pressure? Blood pressure and heart disease  High blood pressure causes heart disease and increases the risk of stroke. This is more likely to develop in people who have high blood pressure readings, are of African descent, or are overweight.  Have your blood pressure checked: ? Every 3-5 years if you are 18-39 years of age. ? Every year if you are 40 years old or older. Diabetes Have regular diabetes screenings. This checks your fasting blood sugar level. Have the screening done:  Once every three years after  age 40 if you are at a normal weight and have a low risk for diabetes.  More often and at a younger age if you are overweight or have a high risk for diabetes. What should I know about preventing infection? Hepatitis B If you have a higher risk for hepatitis B, you should be screened for this virus. Talk with your health care provider to find out if you are at risk for hepatitis B infection. Hepatitis C Testing is recommended for:  Everyone born from 1945 through 1965.  Anyone with known risk factors for hepatitis C. Sexually transmitted infections (STIs)  Get screened for STIs, including gonorrhea and chlamydia, if: ? You are sexually active and are younger than 72 years of age. ? You are older than 72 years of age and your health care provider tells you that you are at risk for this type of infection. ? Your sexual activity has changed since you were last screened, and you are at increased risk for chlamydia or gonorrhea. Ask your health care provider if you are at risk.  Ask your health care provider about whether you are at high risk for HIV. Your health care provider may recommend a prescription medicine to help prevent HIV infection. If you choose to take medicine to prevent HIV, you should first get tested for HIV. You should then be tested every 3 months for as long as you are taking the medicine. Pregnancy  If you are about to stop having your period (premenopausal) and you may become pregnant, seek counseling before you get pregnant.  Take 400 to 800 micrograms (mcg) of folic acid every day if you become pregnant.  Ask for birth control (contraception) if you want to prevent pregnancy. Osteoporosis and menopause Osteoporosis is a disease in which the bones lose minerals and strength with aging. This can result in bone fractures. If you are 65 years old or older, or if you are at risk for osteoporosis and fractures, ask your health care provider if you should:  Be screened for  bone loss.  Take a calcium or vitamin   D supplement to lower your risk of fractures.  Be given hormone replacement therapy (HRT) to treat symptoms of menopause. Follow these instructions at home: Lifestyle  Do not use any products that contain nicotine or tobacco, such as cigarettes, e-cigarettes, and chewing tobacco. If you need help quitting, ask your health care provider.  Do not use street drugs.  Do not share needles.  Ask your health care provider for help if you need support or information about quitting drugs. Alcohol use  Do not drink alcohol if: ? Your health care provider tells you not to drink. ? You are pregnant, may be pregnant, or are planning to become pregnant.  If you drink alcohol: ? Limit how much you use to 0-1 drink a day. ? Limit intake if you are breastfeeding.  Be aware of how much alcohol is in your drink. In the [place] one drink equals one 12 oz bottle of beer (355 mL), one 5 oz glass of wine (148 mL), or one 1 oz glass of hard liquor (44 mL). General instructions  Schedule regular health, dental, and eye exams.  Stay current with your vaccines.  Tell your health care provider if: ? You often feel depressed. ? You have ever been abused or do not feel safe at home. Summary  Adopting a healthy lifestyle and getting preventive care are important in promoting health and wellness.  Follow your health care provider's instructions about healthy diet, exercising, and getting tested or screened for diseases.  Follow your health care provider's instructions on monitoring your cholesterol and blood pressure. This information is not intended to replace advice given to you by your health care provider. Make sure you discuss any questions you have with your health care provider. Document Revised: 05/26/2018 Document Reviewed: 05/26/2018 Elsevier Patient Education  2020 [place] you for enrolling in [place]. Please follow the instructions below to  securely access your online medical record. MyChart allows you to send messages to your doctor, view your test results, manage appointments, and more.   How Do I Sign Up? 1. In your Internet browser, go to [employer] and enter [email or web address]. 2. Click on the Sign Up Now link in the Sign In box. You will see the New Member Sign Up page. 3. Enter your MyChart Access Code exactly as it appears below. You will not need to use this code after you've completed the sign-up process. If you do not sign up before the expiration date, you must request a new code.  MyChart Access Code: Activation code not generated Current MyChart Status: Patient Declined  4. Enter your Social Security Number ([id number]) and Date of Birth (mm/dd/yyyy) as indicated and click Submit. You will be taken to the next sign-up page. 5. Create a MyChart ID. This will be your MyChart login ID and cannot be changed, so think of one that is secure and easy to remember. 6. Create a MyChart password. You can change your password at any time. 7. Enter your Password Reset Question and Answer. This can be used at a later time if you forget your password.  8. Enter your e-mail address. You will receive e-mail notification when new information is available in [place]. 9. Click Sign Up. You can now view your medical record.   Additional Information Remember, MyChart is NOT to be used for urgent needs. For medical emergencies, dial 911.

## 2019-08-16 NOTE — Progress Notes (Signed)
Subjective:   Alicia Jordan is a 74 y.o. female who presents for Medicare Annual (Subsequent) preventive examination.  This wellness visit is conducted by a nurse.  The patient's medications were reviewed and reconciled since the patient's last visit.  History details were provided by the patient.  The history appears to be reliable.    Patient's last AWV was 1 year ago.   Medical History: Patient history and Family history was reviewed  Medications, Allergies, and preventative health maintenance was reviewed and updated.  Review of Systems:  Review of Systems  Constitutional: Negative.   HENT: Negative.   Eyes: Negative.   Respiratory: Negative for chest tightness, shortness of breath and wheezing.   Cardiovascular: Negative for chest pain and palpitations.  Gastrointestinal: Negative.   Endocrine: Negative.   Genitourinary: Negative.   Musculoskeletal: Positive for arthralgias.  Skin: Negative.   Neurological: Negative for dizziness, weakness and headaches.  Psychiatric/Behavioral: Negative for suicidal ideas. The patient is not nervous/anxious.    Cardiac Risk Factors include: advanced age (>83men, >77 women);diabetes mellitus     Objective:     Vitals: BP 112/62 (BP Location: Left Arm, Patient Position: Sitting, Cuff Size: Large)   Pulse 87   Temp 97.8 F (36.6 C)   Resp 16   Ht 5\' 2"  (1.575 m)   SpO2 98%   BMI 27.25 kg/m   Body mass index is 27.25 kg/m.  Advanced Directives 08/16/2019  Does Patient Have a Medical Advance Directive? Yes  Type of Paramedic of [place];Living will  Does patient want to make changes to medical advance directive? No - Patient declined  Copy of [place] in Chart? No - copy requested    Tobacco Social History   Tobacco Use  Smoking Status Never Smoker  Smokeless Tobacco Never Used     Counseling given: Not Answered   Clinical Intake:  Pre-visit preparation completed:  Yes  Pain : 0-10 Pain Score: 5  Pain Type: Acute pain Pain Location: Knee Pain Orientation: Right Pain Descriptors / Indicators: Aching Pain Onset: 1 to 4 weeks ago Pain Relieving Factors: Tylenol, Ibuprofen Effect of Pain on Daily Activities: Painful when moving joint and sleeping. Awaiting MRI  Pain Relieving Factors: Tylenol, Ibuprofen  Nutritional Status: BMI 25 -29 Overweight Nutritional Risks: None Diabetes: Yes CBG done?: No Did pt. bring in CBG monitor from home?: No  How often do you need to have someone help you when you read instructions, pamphlets, or other written materials from your doctor or pharmacy?: 1 - Never  Interpreter Needed?: No     Past Medical History:  Diagnosis Date  . Allergy   . Anxiety   . Arthralgia of left temporomandibular joint   . Arthritis   . Cervicalgia   . Chronic kidney disease, stage 3a   . Depression   . Diabetes mellitus without complication ([place])   . Hesitancy of micturition   . Hyperlipidemia   . Hypertension   . Osteoporosis   . Other psoriasis   . Other specified menopausal and perimenopausal disorders   . Overweight   . Repeated falls   . Residual hemorrhoidal skin tags    Past Surgical History:  Procedure Laterality Date  . ABDOMINAL HYSTERECTOMY    . APPENDECTOMY    . CARPAL TUNNEL RELEASE Right   . CHOLECYSTECTOMY    . COLONOSCOPY    . eye lid surgery    . KNEE SURGERY Left    Family History  Problem Relation Age of Onset  . Colon cancer Daughter   . Diabetes type II Mother   . Hyperlipidemia Mother   . Hypertension Mother   . Heart attack Father   . Hyperlipidemia Father   . Hypertension Father   . Stroke Father   . Diabetes type II Father   . Esophageal cancer Neg Hx   . Rectal cancer Neg Hx   . Stomach cancer Neg Hx    Social History   Socioeconomic History  . Marital status: Married    Spouse name: Not on file  . Number of children: 4  . Years of education: Not on file  . Highest  education level: Not on file  Occupational History  . Occupation: Homemaker  Tobacco Use  . Smoking status: Never Smoker  . Smokeless tobacco: Never Used  Substance and Sexual Activity  . Alcohol use: Never  . Drug use: Never  . Sexual activity: Not on file  Other Topics Concern  . Not on file  Social History Narrative  . Not on file   Social Determinants of Health   Financial Resource Strain:   . Difficulty of Paying Living Expenses: Not on file  Food Insecurity:   . Worried About Charity fundraiser in the Last Year: Not on file  . Ran Out of Food in the Last Year: Not on file  Transportation Needs:   . Lack of Transportation (Medical): Not on file  . Lack of Transportation (Non-Medical): Not on file  Physical Activity:   . Days of Exercise per Week: Not on file  . Minutes of Exercise per Session: Not on file  Stress:   . Feeling of Stress : Not on file  Social Connections:   . Frequency of Communication with Friends and Family: Not on file  . Frequency of Social Gatherings with Friends and Family: Not on file  . Attends Religious Services: Not on file  . Active Member of Clubs or Organizations: Not on file  . Attends Archivist Meetings: Not on file  . Marital Status: Not on file    Outpatient Encounter Medications as of 08/16/2019  Medication Sig  . Acetaminophen (TYLENOL PO) Take by mouth as needed.  Marland Kitchen aspirin 81 MG EC tablet Take 81 mg by mouth daily. Swallow whole.  . calcium-vitamin D (OSCAL WITH D) 500-200 MG-UNIT tablet Take 1 tablet by mouth.  . Cetirizine HCl (ZYRTEC ALLERGY) 10 MG CAPS Take by mouth daily.  Marland Kitchen dextromethorphan-guaiFENesin (MUCINEX DM) 30-600 MG 12hr tablet Take 1 tablet by mouth 2 (two) times daily.  Marland Kitchen FLUoxetine (PROZAC) 40 MG capsule Take 40 mg by mouth daily.  Marland Kitchen losartan-hydrochlorothiazide (HYZAAR) 50-12.5 MG tablet Take 1 tablet by mouth daily.  . metFORMIN (GLUCOPHAGE) 1000 MG tablet Take 1,000 mg by mouth 2 (two) times daily.   Marland Kitchen omeprazole (PRILOSEC) 40 MG capsule TAKE ONE CAPSULE BY MOUTH 30 MINUTES BEFORE MEAL.  . simvastatin (ZOCOR) 40 MG tablet Take 40 mg by mouth at bedtime.  . Zinc 50 MG CAPS Take by mouth.   No facility-administered encounter medications on file as of 08/16/2019.    Activities of Daily Living In your present state of health, do you have any difficulty performing the following activities: 08/16/2019  Hearing? N  Vision? N  Difficulty concentrating or making decisions? N  Walking or climbing stairs? N  Dressing or bathing? N  Doing errands, shopping? N  Preparing Food and eating ? N  Using the Toilet? N  In the past six months, have you accidently leaked urine? N  Do you have problems with loss of bowel control? N  Managing your Medications? N  Managing your Finances? N  Housekeeping or managing your Housekeeping? N  Some recent data might be hidden    Patient Care Team: [name], MD as PCP - General (Family Medicine)    Assessment:   This is a routine wellness examination for Alicia Jordan.  Exercise Activities and Dietary recommendations Current Exercise Habits: Home exercise routine, Time (Minutes): 30, Frequency (Times/Week): 3, Weekly Exercise (Minutes/Week): 90, Intensity: Moderate  Goals    . Prevent falls       Fall Risk Fall Risk  08/16/2019 08/11/2019  Falls in the past year? 1 1  Comment - Patient states she fell Monday tripped over the dog bed in the nedroom.  Number falls in past yr: 0 0  Injury with Fall? 1 1  Follow up Falls prevention discussed -   Is the patient's home free of loose throw rugs in walkways, pet beds, electrical cords, etc?   no      Grab bars in the bathroom? no      Handrails on the stairs?   yes      Adequate lighting?   yes   Depression Screen PHQ 2/9 Scores 08/16/2019  PHQ - 2 Score 0     Cognitive Function     6CIT Screen 08/16/2019  What Year? 0 points  What month? 0 points  What time? 0 points  Count back from 20 0 points   Months in reverse 0 points  Repeat phrase 0 points  Total Score 0    Immunization History  Administered Date(s) Administered  . Fluad Quad(high Dose 65+) 02/08/2019  . Pneumococcal Conjugate-13 05/13/2014  . Pneumococcal Polysaccharide-23 01/29/2017  . Tdap 05/03/2012  . Zoster 05/16/2010  . Zoster Recombinat (Shingrix) 06/11/2017    Screening Tests Health Maintenance  Topic Date Due  . Hepatitis C Screening  [birth year]  . FOOT EXAM  11/12/1955  . DEXA SCAN  07/21/2019  . MAMMOGRAM  07/22/2019  . OPHTHALMOLOGY EXAM  01/26/2020  . HEMOGLOBIN A1C  02/08/2020  . TETANUS/TDAP  05/03/2022  . COLONOSCOPY  06/14/2029  . INFLUENZA VACCINE  Completed  . PNA vac Low Risk Adult  Completed    Cancer Screenings: Lung: Low Dose CT Chest recommended if Age 53-80 years, 30 pack-year currently smoking OR have quit w/in 15years. Patient does not qualify. Breast:  Up to date on Mammogram? NoUp to date of Bone Density/Dexa? No  Colorectal: Colonoscopy done 06/15/2019      Plan:   Mammogram and DEXA scan have been ordered.  Patient will call this office to schedule once her right shoulder and knee pain is better. She is not yet due for [facility].  She will continue to follow-up with ortho about the pain, she is currently awaiting an MRI ordered by Dr Samule Dry.  Patient will fix items in her home that may pose a threat for falls.  I have personally reviewed and noted the following in the patient's chart:   . Medical and social history . Use of alcohol, tobacco or illicit drugs  . Current medications and supplements . Functional ability and status . Nutritional status . Physical activity . Advanced directives . List of other physicians . Hospitalizations, surgeries, and ER visits in previous 12 months . Vitals . Screenings to include cognitive, depression, and falls . Referrals and  appointments  In addition, I have reviewed and discussed with  patient certain preventive protocols, quality metrics, and best practice recommendations. A written personalized care plan for preventive services as well as general preventive health recommendations were provided to patient.     Erie Noe, LPN  [id number]

## 2019-08-19 DIAGNOSIS — S46011A Strain of muscle(s) and tendon(s) of the rotator cuff of right shoulder, initial encounter: Secondary | ICD-10-CM | POA: Diagnosis not present

## 2019-08-19 DIAGNOSIS — M25511 Pain in right shoulder: Secondary | ICD-10-CM | POA: Diagnosis not present

## 2019-08-21 ENCOUNTER — Encounter: Payer: Self-pay | Admitting: Family Medicine

## 2019-08-21 DIAGNOSIS — M25511 Pain in right shoulder: Secondary | ICD-10-CM | POA: Insufficient documentation

## 2019-08-21 NOTE — Assessment & Plan Note (Signed)
Well controlled.  ?No changes to medicines.  ?Continue to work on eating a healthy diet and exercise.  ?Labs drawn today.  ?

## 2019-08-21 NOTE — Assessment & Plan Note (Addendum)
Well controlled.  No changes to medicines.  .   

## 2019-08-21 NOTE — Assessment & Plan Note (Signed)
Keep appt with Dr. Samule Dry. I did not perform a significant examination today since she has an appt this with Dr. Samule Dry.

## 2019-08-21 NOTE — Assessment & Plan Note (Addendum)
Well controlled.  No changes to medicines.  Continue to work on eating a healthy diet and exercise.  Labs drawn today. Microalbumin 80 today. Continue to check sugars and feet daily.

## 2019-08-24 DIAGNOSIS — M25511 Pain in right shoulder: Secondary | ICD-10-CM | POA: Diagnosis not present

## 2019-08-24 DIAGNOSIS — S46011A Strain of muscle(s) and tendon(s) of the rotator cuff of right shoulder, initial encounter: Secondary | ICD-10-CM | POA: Diagnosis not present

## 2019-09-05 DIAGNOSIS — Z1159 Encounter for screening for other viral diseases: Secondary | ICD-10-CM | POA: Diagnosis not present

## 2019-09-09 DIAGNOSIS — S46011A Strain of muscle(s) and tendon(s) of the rotator cuff of right shoulder, initial encounter: Secondary | ICD-10-CM | POA: Diagnosis not present

## 2019-09-09 DIAGNOSIS — G8918 Other acute postprocedural pain: Secondary | ICD-10-CM | POA: Diagnosis not present

## 2019-09-09 DIAGNOSIS — I110 Hypertensive heart disease with heart failure: Secondary | ICD-10-CM | POA: Diagnosis not present

## 2019-09-09 DIAGNOSIS — F419 Anxiety disorder, unspecified: Secondary | ICD-10-CM | POA: Diagnosis not present

## 2019-09-09 DIAGNOSIS — I509 Heart failure, unspecified: Secondary | ICD-10-CM | POA: Diagnosis not present

## 2019-09-09 DIAGNOSIS — E785 Hyperlipidemia, unspecified: Secondary | ICD-10-CM | POA: Diagnosis not present

## 2019-09-09 DIAGNOSIS — M75121 Complete rotator cuff tear or rupture of right shoulder, not specified as traumatic: Secondary | ICD-10-CM | POA: Diagnosis not present

## 2019-09-09 DIAGNOSIS — M7521 Bicipital tendinitis, right shoulder: Secondary | ICD-10-CM | POA: Diagnosis not present

## 2019-09-09 DIAGNOSIS — M7541 Impingement syndrome of right shoulder: Secondary | ICD-10-CM | POA: Diagnosis not present

## 2019-09-09 DIAGNOSIS — M25511 Pain in right shoulder: Secondary | ICD-10-CM | POA: Diagnosis not present

## 2019-09-09 DIAGNOSIS — Z7984 Long term (current) use of oral hypoglycemic drugs: Secondary | ICD-10-CM | POA: Diagnosis not present

## 2019-09-09 DIAGNOSIS — M1990 Unspecified osteoarthritis, unspecified site: Secondary | ICD-10-CM | POA: Diagnosis not present

## 2019-09-09 DIAGNOSIS — Z7982 Long term (current) use of aspirin: Secondary | ICD-10-CM | POA: Diagnosis not present

## 2019-09-09 DIAGNOSIS — M7551 Bursitis of right shoulder: Secondary | ICD-10-CM | POA: Diagnosis not present

## 2019-09-09 DIAGNOSIS — Z79899 Other long term (current) drug therapy: Secondary | ICD-10-CM | POA: Diagnosis not present

## 2019-09-09 DIAGNOSIS — K219 Gastro-esophageal reflux disease without esophagitis: Secondary | ICD-10-CM | POA: Diagnosis not present

## 2019-09-09 DIAGNOSIS — E119 Type 2 diabetes mellitus without complications: Secondary | ICD-10-CM | POA: Diagnosis not present

## 2019-09-12 DIAGNOSIS — M25611 Stiffness of right shoulder, not elsewhere classified: Secondary | ICD-10-CM | POA: Diagnosis not present

## 2019-09-12 DIAGNOSIS — M25511 Pain in right shoulder: Secondary | ICD-10-CM | POA: Diagnosis not present

## 2019-09-12 DIAGNOSIS — M25411 Effusion, right shoulder: Secondary | ICD-10-CM | POA: Diagnosis not present

## 2019-09-15 ENCOUNTER — Other Ambulatory Visit: Payer: Self-pay | Admitting: Family Medicine

## 2019-09-20 DIAGNOSIS — M25511 Pain in right shoulder: Secondary | ICD-10-CM | POA: Diagnosis not present

## 2019-09-20 DIAGNOSIS — M25411 Effusion, right shoulder: Secondary | ICD-10-CM | POA: Diagnosis not present

## 2019-09-20 DIAGNOSIS — M25611 Stiffness of right shoulder, not elsewhere classified: Secondary | ICD-10-CM | POA: Diagnosis not present

## 2019-09-23 DIAGNOSIS — M25511 Pain in right shoulder: Secondary | ICD-10-CM | POA: Diagnosis not present

## 2019-09-23 DIAGNOSIS — M25411 Effusion, right shoulder: Secondary | ICD-10-CM | POA: Diagnosis not present

## 2019-09-23 DIAGNOSIS — M25611 Stiffness of right shoulder, not elsewhere classified: Secondary | ICD-10-CM | POA: Diagnosis not present

## 2019-09-26 ENCOUNTER — Other Ambulatory Visit: Payer: Self-pay

## 2019-09-26 DIAGNOSIS — M25611 Stiffness of right shoulder, not elsewhere classified: Secondary | ICD-10-CM | POA: Diagnosis not present

## 2019-09-26 DIAGNOSIS — M25511 Pain in right shoulder: Secondary | ICD-10-CM | POA: Diagnosis not present

## 2019-09-26 DIAGNOSIS — M25411 Effusion, right shoulder: Secondary | ICD-10-CM | POA: Diagnosis not present

## 2019-09-30 DIAGNOSIS — M25511 Pain in right shoulder: Secondary | ICD-10-CM | POA: Diagnosis not present

## 2019-09-30 DIAGNOSIS — M25411 Effusion, right shoulder: Secondary | ICD-10-CM | POA: Diagnosis not present

## 2019-09-30 DIAGNOSIS — M25611 Stiffness of right shoulder, not elsewhere classified: Secondary | ICD-10-CM | POA: Diagnosis not present

## 2019-10-03 DIAGNOSIS — M25511 Pain in right shoulder: Secondary | ICD-10-CM | POA: Diagnosis not present

## 2019-10-03 DIAGNOSIS — M25411 Effusion, right shoulder: Secondary | ICD-10-CM | POA: Diagnosis not present

## 2019-10-03 DIAGNOSIS — M25611 Stiffness of right shoulder, not elsewhere classified: Secondary | ICD-10-CM | POA: Diagnosis not present

## 2019-10-05 ENCOUNTER — Telehealth: Payer: Self-pay | Admitting: Family Medicine

## 2019-10-05 NOTE — Progress Notes (Signed)
  Chronic Care Management   Outreach Note  10/05/2019 Name: IYAHNA LANGSAM MRN: [id number] DOB: Jun 26, 1945  Referred by: [name], MD Reason for referral : No chief complaint on file.   An unsuccessful telephone outreach was attempted today. The patient was referred to the pharmacist for assistance with care management and care coordination.   Follow Up Plan:   Earney [place] Upstream Scheduler

## 2019-10-06 DIAGNOSIS — M25511 Pain in right shoulder: Secondary | ICD-10-CM | POA: Diagnosis not present

## 2019-10-06 DIAGNOSIS — M25411 Effusion, right shoulder: Secondary | ICD-10-CM | POA: Diagnosis not present

## 2019-10-06 DIAGNOSIS — M25611 Stiffness of right shoulder, not elsewhere classified: Secondary | ICD-10-CM | POA: Diagnosis not present

## 2019-10-10 DIAGNOSIS — M25411 Effusion, right shoulder: Secondary | ICD-10-CM | POA: Diagnosis not present

## 2019-10-10 DIAGNOSIS — M25611 Stiffness of right shoulder, not elsewhere classified: Secondary | ICD-10-CM | POA: Diagnosis not present

## 2019-10-10 DIAGNOSIS — M25511 Pain in right shoulder: Secondary | ICD-10-CM | POA: Diagnosis not present

## 2019-10-11 ENCOUNTER — Telehealth: Payer: Self-pay | Admitting: Family Medicine

## 2019-10-11 NOTE — Progress Notes (Signed)
  Chronic Care Management   Note  10/11/2019 Name: Alicia Jordan MRN: [id number] DOB: 10/02/1945  Alicia Jordan is a 74 y.o. year old female who is a primary care patient of Cox, Kirsten, MD. I reached out to [name] by phone today in response to a referral sent by [name] PCP, Cox, Kirsten, MD.   [name] was given information about Chronic Care Management services today including:  1. CCM service includes personalized support from designated clinical staff supervised by her physician, including individualized plan of care and coordination with other care providers 2. 24/7 contact phone numbers for assistance for urgent and routine care needs. 3. Service will only be billed when office clinical staff spend 20 minutes or more in a month to coordinate care. 4. Only one practitioner may furnish and bill the service in a calendar month. 5. The patient may stop CCM services at any time (effective at the end of the month) by phone call to the office staff.   Patient did not agree to services and wishes to consider information provided before deciding about enrollment in care management services.   This note is not being shared with the patient for the following reason: To respect privacy (The patient or proxy has requested that the information not be shared).  Follow up plan:   Earney [facility]

## 2019-10-13 DIAGNOSIS — M25611 Stiffness of right shoulder, not elsewhere classified: Secondary | ICD-10-CM | POA: Diagnosis not present

## 2019-10-13 DIAGNOSIS — M25511 Pain in right shoulder: Secondary | ICD-10-CM | POA: Diagnosis not present

## 2019-10-13 DIAGNOSIS — M25411 Effusion, right shoulder: Secondary | ICD-10-CM | POA: Diagnosis not present

## 2019-10-17 DIAGNOSIS — M25611 Stiffness of right shoulder, not elsewhere classified: Secondary | ICD-10-CM | POA: Diagnosis not present

## 2019-10-17 DIAGNOSIS — M25511 Pain in right shoulder: Secondary | ICD-10-CM | POA: Diagnosis not present

## 2019-10-17 DIAGNOSIS — M25411 Effusion, right shoulder: Secondary | ICD-10-CM | POA: Diagnosis not present

## 2019-10-19 DIAGNOSIS — M25611 Stiffness of right shoulder, not elsewhere classified: Secondary | ICD-10-CM | POA: Diagnosis not present

## 2019-10-19 DIAGNOSIS — M25511 Pain in right shoulder: Secondary | ICD-10-CM | POA: Diagnosis not present

## 2019-10-19 DIAGNOSIS — M25411 Effusion, right shoulder: Secondary | ICD-10-CM | POA: Diagnosis not present

## 2019-10-25 DIAGNOSIS — M25411 Effusion, right shoulder: Secondary | ICD-10-CM | POA: Diagnosis not present

## 2019-10-25 DIAGNOSIS — M25511 Pain in right shoulder: Secondary | ICD-10-CM | POA: Diagnosis not present

## 2019-10-25 DIAGNOSIS — M25611 Stiffness of right shoulder, not elsewhere classified: Secondary | ICD-10-CM | POA: Diagnosis not present

## 2019-10-27 DIAGNOSIS — M25611 Stiffness of right shoulder, not elsewhere classified: Secondary | ICD-10-CM | POA: Diagnosis not present

## 2019-10-27 DIAGNOSIS — M25511 Pain in right shoulder: Secondary | ICD-10-CM | POA: Diagnosis not present

## 2019-10-27 DIAGNOSIS — M25411 Effusion, right shoulder: Secondary | ICD-10-CM | POA: Diagnosis not present

## 2019-11-01 DIAGNOSIS — M25411 Effusion, right shoulder: Secondary | ICD-10-CM | POA: Diagnosis not present

## 2019-11-01 DIAGNOSIS — M25511 Pain in right shoulder: Secondary | ICD-10-CM | POA: Diagnosis not present

## 2019-11-01 DIAGNOSIS — M25611 Stiffness of right shoulder, not elsewhere classified: Secondary | ICD-10-CM | POA: Diagnosis not present

## 2019-11-03 ENCOUNTER — Other Ambulatory Visit: Payer: Self-pay

## 2019-11-03 DIAGNOSIS — M25411 Effusion, right shoulder: Secondary | ICD-10-CM | POA: Diagnosis not present

## 2019-11-03 DIAGNOSIS — M25511 Pain in right shoulder: Secondary | ICD-10-CM | POA: Diagnosis not present

## 2019-11-03 DIAGNOSIS — M25611 Stiffness of right shoulder, not elsewhere classified: Secondary | ICD-10-CM | POA: Diagnosis not present

## 2019-11-08 DIAGNOSIS — M25511 Pain in right shoulder: Secondary | ICD-10-CM | POA: Diagnosis not present

## 2019-11-08 DIAGNOSIS — M25611 Stiffness of right shoulder, not elsewhere classified: Secondary | ICD-10-CM | POA: Diagnosis not present

## 2019-11-08 DIAGNOSIS — M25411 Effusion, right shoulder: Secondary | ICD-10-CM | POA: Diagnosis not present

## 2019-11-08 NOTE — Progress Notes (Signed)
Subjective:  Patient ID: Alicia Jordan, female    DOB: 1945-06-19  Age: 74 y.o. MRN: [id number]  Chief Complaint  Patient presents with  . Hyperlipidemia  . Hypertension  . Diabetes    HPI  Patient is a 74 year old white female with history of diabetes, hypertension, and hyperlipidemia who presents for routine follow-up.  The patient tries to eat healthy.  She exercises some but mainly at physical therapy.  Patient is not checking her sugars daily and last a1c was 6.6.   Patient has hypertension. She is tolerating all of her medications.  She is compliant with taking her medications.   She is ran from her fall in February.  She had rotator cuff surgery of the right shoulder.  She is currently in physical therapy and although she is improving she has a significant amount of pain still in her right shoulder.  She does have oxycodone from Dr. Samule Dry which she will use occasionally normally prior to going to physical therapy.   She is using Tylenol daily.    Social History   Socioeconomic History  . Marital status: Married    Spouse name: Not on file  . Number of children: 4  . Years of education: Not on file  . Highest education level: Not on file  Occupational History  . Occupation: Homemaker  Tobacco Use  . Smoking status: Never Smoker  . Smokeless tobacco: Never Used  Substance and Sexual Activity  . Alcohol use: Never  . Drug use: Never  . Sexual activity: Not on file  Other Topics Concern  . Not on file  Social History Narrative  . Not on file   Social Determinants of Health   Financial Resource Strain:   . Difficulty of Paying Living Expenses:   Food Insecurity:   . Worried About Charity fundraiser in the Last Year:   . Arboriculturist in the Last Year:   Transportation Needs:   . Film/video editor (Medical):   Marland Kitchen Lack of Transportation (Non-Medical):   Physical Activity:   . Days of Exercise per Week:   . Minutes of Exercise per Session:   Stress:     . Feeling of Stress :   Social Connections:   . Frequency of Communication with Friends and Family:   . Frequency of Social Gatherings with Friends and Family:   . Attends Religious Services:   . Active Member of Clubs or Organizations:   . Attends Archivist Meetings:   Marland Kitchen Marital Status:    Past Medical History:  Diagnosis Date  . Allergy   . Anxiety   . Arthralgia of left temporomandibular joint   . Arthritis   . Cervicalgia   . Chronic kidney disease, stage 3a   . Depression   . Diabetes mellitus without complication ([place])   . Hesitancy of micturition   . Hyperlipidemia   . Hypertension   . Osteoporosis   . Other psoriasis   . Other specified menopausal and perimenopausal disorders   . Overweight   . Repeated falls   . Residual hemorrhoidal skin tags    Family History  Problem Relation Age of Onset  . Colon cancer Daughter   . Diabetes type II Mother   . Hyperlipidemia Mother   . Hypertension Mother   . Heart attack Father   . Hyperlipidemia Father   . Hypertension Father   . Stroke Father   . Diabetes type II Father   . Esophageal  cancer Neg Hx   . Rectal cancer Neg Hx   . Stomach cancer Neg Hx     Review of Systems  Constitutional: Negative for chills, fatigue and fever.  HENT: Positive for rhinorrhea. Negative for congestion, ear pain, sinus pressure and sore throat.   Respiratory: Negative for cough and shortness of breath.   Cardiovascular: Negative for chest pain.  Gastrointestinal: Negative for abdominal pain, constipation, diarrhea, nausea and vomiting.  Endocrine: Positive for polyuria. Negative for polydipsia and polyphagia.  Genitourinary: Negative for dysuria and urgency.  Musculoskeletal: Positive for arthralgias and myalgias. Negative for back pain.       Right shoulder pain  Neurological: Negative for dizziness, weakness, light-headedness and headaches.  Psychiatric/Behavioral: Negative for dysphoric mood. The patient is not  nervous/anxious.     Objective:  BP 132/64   Pulse 85   Temp (!) 97.4 F (36.3 C)   Ht 5\' 2"  (1.575 m)   Wt 152 lb (68.9 kg)   SpO2 100%   BMI 27.80 kg/m   BP/Weight 11/09/2019 08/16/2019 [id number]  Systolic BP [id number]  Diastolic BP 64 62 68  Wt. (Lbs) 152 151.4 149  BMI 27.8 27.69 27.25    Physical Exam Vitals reviewed.  Constitutional:      Appearance: Normal appearance. She is normal weight. She is not ill-appearing.  Neck:     Vascular: No carotid bruit.  Cardiovascular:     Rate and Rhythm: Normal rate and regular rhythm.     Pulses: Normal pulses.     Heart sounds: Normal heart sounds.  Pulmonary:     Effort: Pulmonary effort is normal. No respiratory distress.     Breath sounds: Normal breath sounds.  Abdominal:     General: Abdomen is flat. Bowel sounds are normal.     Palpations: Abdomen is soft.     Tenderness: There is no abdominal tenderness.  Musculoskeletal:     Comments: Rt shoulder abduction and internal rotation.   Skin:    General: Skin is warm.  Neurological:     Mental Status: She is alert and oriented to person, place, and time.  Psychiatric:        Mood and Affect: Mood normal.        Behavior: Behavior normal.     Lab Results  Component Value Date   WBC 7.0 08/11/2019   HGB 11.9 08/11/2019   HCT 36.7 08/11/2019   PLT 234 08/11/2019   GLUCOSE 138 (H) 08/11/2019   CHOL 146 08/11/2019   TRIG 167 (H) 08/11/2019   HDL 50 08/11/2019   LDLCALC 68 08/11/2019   AST 13 08/11/2019   NA 143 08/11/2019   K 4.4 08/11/2019   CL 99 08/11/2019   CREATININE 1.23 (H) 08/11/2019   BUN 18 08/11/2019   TSH 3.170 08/11/2019   HGBA1C 6.6 (H) 08/11/2019   MICROALBUR 80 08/11/2019      Assessment & Plan:   1. Essential hypertension, benign Well controlled.  No changes to medicines.  Continue to work on eating a healthy diet and exercise.  Labs drawn today.  - Comprehensive metabolic panel  2. Dyslipidemia associated with type 2 diabetes  mellitus ([place]) Control: good.  Does not need to check sugars. Recommend check feet daily. Recommend annual eye exams. Medicines:no changes.  Continue to work on eating a healthy diet and exercise.  Labs drawn today.   - Lipid panel - Hemoglobin A1c - CBC with Differential/Platelet  3. GERD without esophagitis The current medical  regimen is effective;  continue present plan and medications.  4. Mixed hyperlipidemia Well controlled.  No changes to medicines.  Continue to work on eating a healthy diet and exercise.  Labs drawn today.   5. Chronic right shoulder pain Continue physical therapy.  Continue tylenol.  Follow up with Dr. Samule Dry.  Follow-up: Return in about 6 months (around 05/11/2020) for fasting.  AVS was given to patient prior to departure.  [name] [facility] [phone]

## 2019-11-09 ENCOUNTER — Other Ambulatory Visit: Payer: Self-pay

## 2019-11-09 ENCOUNTER — Ambulatory Visit (INDEPENDENT_AMBULATORY_CARE_PROVIDER_SITE_OTHER): Payer: PPO | Admitting: Family Medicine

## 2019-11-09 ENCOUNTER — Encounter: Payer: Self-pay | Admitting: Family Medicine

## 2019-11-09 VITALS — BP 132/64 | HR 85 | Temp 97.4°F | Ht 62.0 in | Wt 152.0 lb

## 2019-11-09 DIAGNOSIS — I10 Essential (primary) hypertension: Secondary | ICD-10-CM

## 2019-11-09 DIAGNOSIS — E785 Hyperlipidemia, unspecified: Secondary | ICD-10-CM

## 2019-11-09 DIAGNOSIS — E1169 Type 2 diabetes mellitus with other specified complication: Secondary | ICD-10-CM | POA: Diagnosis not present

## 2019-11-09 DIAGNOSIS — K219 Gastro-esophageal reflux disease without esophagitis: Secondary | ICD-10-CM | POA: Diagnosis not present

## 2019-11-09 DIAGNOSIS — E782 Mixed hyperlipidemia: Secondary | ICD-10-CM

## 2019-11-09 DIAGNOSIS — M25511 Pain in right shoulder: Secondary | ICD-10-CM | POA: Diagnosis not present

## 2019-11-09 DIAGNOSIS — G8929 Other chronic pain: Secondary | ICD-10-CM

## 2019-11-09 NOTE — Patient Instructions (Signed)
Have a good summer!

## 2019-11-10 DIAGNOSIS — M25611 Stiffness of right shoulder, not elsewhere classified: Secondary | ICD-10-CM | POA: Diagnosis not present

## 2019-11-10 DIAGNOSIS — M25511 Pain in right shoulder: Secondary | ICD-10-CM | POA: Diagnosis not present

## 2019-11-10 DIAGNOSIS — M25411 Effusion, right shoulder: Secondary | ICD-10-CM | POA: Diagnosis not present

## 2019-11-10 LAB — COMPREHENSIVE METABOLIC PANEL
ALT: 9 IU/L (ref 0–32)
AST: 13 IU/L (ref 0–40)
Albumin/Globulin Ratio: 2.1 (ref 1.2–2.2)
Albumin: 4.7 g/dL (ref 3.7–4.7)
Alkaline Phosphatase: 63 IU/L (ref 48–121)
BUN/Creatinine Ratio: 16 (ref 12–28)
BUN: 19 mg/dL (ref 8–27)
Bilirubin Total: 0.6 mg/dL (ref 0.0–1.2)
CO2: 21 mmol/L (ref 20–29)
Calcium: 10.1 mg/dL (ref 8.7–10.3)
Chloride: 104 mmol/L (ref 96–106)
Creatinine, Ser: 1.16 mg/dL — ABNORMAL HIGH (ref 0.57–1.00)
GFR calc Af Amer: 55 mL/min/{1.73_m2} — ABNORMAL LOW (ref >59)
GFR calc non Af Amer: 47 mL/min/{1.73_m2} — ABNORMAL LOW (ref >59)
Globulin, Total: 2.2 g/dL (ref 1.5–4.5)
Glucose: 125 mg/dL — ABNORMAL HIGH (ref 65–99)
Potassium: 5.2 mmol/L (ref 3.5–5.2)
Sodium: 143 mmol/L (ref 134–144)
Total Protein: 6.9 g/dL (ref 6.0–8.5)

## 2019-11-10 LAB — CBC WITH DIFFERENTIAL/PLATELET
Basophils Absolute: 0 10*3/uL (ref 0.0–0.2)
Basos: 1 %
EOS (ABSOLUTE): 0.3 10*3/uL (ref 0.0–0.4)
Eos: 4 %
Hematocrit: 35.2 % (ref 34.0–46.6)
Hemoglobin: 10.9 g/dL — ABNORMAL LOW (ref 11.1–15.9)
Immature Grans (Abs): 0 10*3/uL (ref 0.0–0.1)
Immature Granulocytes: 0 %
Lymphocytes Absolute: 1.9 10*3/uL (ref 0.7–3.1)
Lymphs: 31 %
MCH: 26.6 pg (ref 26.6–33.0)
MCHC: 31 g/dL — ABNORMAL LOW (ref 31.5–35.7)
MCV: 86 fL (ref 79–97)
Monocytes Absolute: 0.4 10*3/uL (ref 0.1–0.9)
Monocytes: 6 %
Neutrophils Absolute: 3.4 10*3/uL (ref 1.4–7.0)
Neutrophils: 58 %
Platelets: 174 10*3/uL (ref 150–450)
RBC: 4.1 x10E6/uL (ref 3.77–5.28)
RDW: 14.2 % (ref 11.7–15.4)
WBC: 6 10*3/uL (ref 3.4–10.8)

## 2019-11-10 LAB — HEMOGLOBIN A1C
Est. average glucose Bld gHb Est-mCnc: 140 mg/dL
Hgb A1c MFr Bld: 6.5 % — ABNORMAL HIGH (ref 4.8–5.6)

## 2019-11-10 LAB — LIPID PANEL
Chol/HDL Ratio: 3.4 ratio (ref 0.0–4.4)
Cholesterol, Total: 158 mg/dL (ref 100–199)
HDL: 47 mg/dL (ref >39)
LDL Chol Calc (NIH): 82 mg/dL (ref 0–99)
Triglycerides: 167 mg/dL — ABNORMAL HIGH (ref 0–149)
VLDL Cholesterol Cal: 29 mg/dL (ref 5–40)

## 2019-11-10 LAB — CARDIOVASCULAR RISK ASSESSMENT

## 2019-11-14 ENCOUNTER — Other Ambulatory Visit: Payer: Self-pay | Admitting: Family Medicine

## 2019-11-14 ENCOUNTER — Other Ambulatory Visit: Payer: Self-pay | Admitting: Physician Assistant

## 2019-11-15 DIAGNOSIS — M25411 Effusion, right shoulder: Secondary | ICD-10-CM | POA: Diagnosis not present

## 2019-11-15 DIAGNOSIS — M25511 Pain in right shoulder: Secondary | ICD-10-CM | POA: Diagnosis not present

## 2019-11-15 DIAGNOSIS — M25611 Stiffness of right shoulder, not elsewhere classified: Secondary | ICD-10-CM | POA: Diagnosis not present

## 2019-11-17 DIAGNOSIS — M25511 Pain in right shoulder: Secondary | ICD-10-CM | POA: Diagnosis not present

## 2019-11-17 DIAGNOSIS — M25411 Effusion, right shoulder: Secondary | ICD-10-CM | POA: Diagnosis not present

## 2019-11-17 DIAGNOSIS — M25611 Stiffness of right shoulder, not elsewhere classified: Secondary | ICD-10-CM | POA: Diagnosis not present

## 2019-11-22 DIAGNOSIS — M25511 Pain in right shoulder: Secondary | ICD-10-CM | POA: Diagnosis not present

## 2019-11-22 DIAGNOSIS — M25411 Effusion, right shoulder: Secondary | ICD-10-CM | POA: Diagnosis not present

## 2019-11-22 DIAGNOSIS — M25611 Stiffness of right shoulder, not elsewhere classified: Secondary | ICD-10-CM | POA: Diagnosis not present

## 2019-11-24 DIAGNOSIS — M25411 Effusion, right shoulder: Secondary | ICD-10-CM | POA: Diagnosis not present

## 2019-11-24 DIAGNOSIS — M25611 Stiffness of right shoulder, not elsewhere classified: Secondary | ICD-10-CM | POA: Diagnosis not present

## 2019-11-24 DIAGNOSIS — M25511 Pain in right shoulder: Secondary | ICD-10-CM | POA: Diagnosis not present

## 2019-11-29 DIAGNOSIS — M25611 Stiffness of right shoulder, not elsewhere classified: Secondary | ICD-10-CM | POA: Diagnosis not present

## 2019-11-29 DIAGNOSIS — M25411 Effusion, right shoulder: Secondary | ICD-10-CM | POA: Diagnosis not present

## 2019-11-29 DIAGNOSIS — M25511 Pain in right shoulder: Secondary | ICD-10-CM | POA: Diagnosis not present

## 2019-12-08 DIAGNOSIS — M1711 Unilateral primary osteoarthritis, right knee: Secondary | ICD-10-CM | POA: Diagnosis not present

## 2019-12-24 ENCOUNTER — Other Ambulatory Visit: Payer: Self-pay | Admitting: Physician Assistant

## 2019-12-27 ENCOUNTER — Other Ambulatory Visit: Payer: Self-pay | Admitting: Legal Medicine

## 2019-12-27 DIAGNOSIS — Z0000 Encounter for general adult medical examination without abnormal findings: Secondary | ICD-10-CM

## 2019-12-27 DIAGNOSIS — Z1231 Encounter for screening mammogram for malignant neoplasm of breast: Secondary | ICD-10-CM

## 2019-12-31 ENCOUNTER — Other Ambulatory Visit: Payer: Self-pay | Admitting: Physician Assistant

## 2020-01-03 ENCOUNTER — Other Ambulatory Visit: Payer: Self-pay

## 2020-01-03 ENCOUNTER — Telehealth: Payer: Self-pay

## 2020-01-03 MED ORDER — LOSARTAN POTASSIUM 100 MG PO TABS: 100.0000 mg | ORAL_TABLET | Freq: Every day | ORAL | 1 refills | Status: DC

## 2020-01-03 NOTE — Telephone Encounter (Signed)
PHARMACY:[facility] [place] MEDICATION: losartan 100 mg take 1 tab daily--she would like a 90 day supply MSG: THE PT CALLED TODAY REQUESTING A REFILL ON THE MEDICATION(S) LISTED ABOVE.

## 2020-01-20 ENCOUNTER — Encounter: Payer: Self-pay | Admitting: Family Medicine

## 2020-01-20 ENCOUNTER — Ambulatory Visit (INDEPENDENT_AMBULATORY_CARE_PROVIDER_SITE_OTHER): Payer: PPO | Admitting: Family Medicine

## 2020-01-20 ENCOUNTER — Other Ambulatory Visit: Payer: Self-pay

## 2020-01-20 VITALS — BP 144/60 | HR 88 | Temp 97.7°F | Resp 16 | Ht 62.0 in | Wt 149.6 lb

## 2020-01-20 DIAGNOSIS — R600 Localized edema: Secondary | ICD-10-CM | POA: Insufficient documentation

## 2020-01-20 MED ORDER — HYDROCHLOROTHIAZIDE 25 MG PO TABS
25.0000 mg | ORAL_TABLET | Freq: Every day | ORAL | 1 refills | Status: DC
Start: 2020-01-20 — End: 2020-03-12

## 2020-01-20 NOTE — Patient Instructions (Signed)
Recommend elevation of legs.  Decrease salt.  Recommend and get compression stockings from [employer] [facility]. Prescription for hydrochlorothiazide 25 mg once daily prescribed patient may take this over the next 2 to 4 weeks and then discontinue.

## 2020-01-20 NOTE — Progress Notes (Signed)
Acute Office Visit  Subjective:    Patient ID: Alicia Jordan, female    DOB: 1946-04-12, 74 y.o.   MRN: [id number]  Chief Complaint  Patient presents with   swelling of feet    HPI Patient is in today with complaints of swelling of bilateral feet.  The right is worse than the left.  Has some mild discomfort due to the swelling however not significant leg pain.  Patient has been in the process of moving over the last 2 weeks into [place].  Denies any increased salt intake.  Denies shortness of breath orthopnea.    Past Medical History:  Diagnosis Date   Allergy    Anxiety    Arthralgia of left temporomandibular joint    Arthritis    Cervicalgia    Chronic kidney disease, stage 3a    Depression    Diabetes mellitus without complication ([place])    Hesitancy of micturition    Hyperlipidemia    Hypertension    Osteoporosis    Other psoriasis    Other specified menopausal and perimenopausal disorders    Overweight    Repeated falls    Residual hemorrhoidal skin tags     Past Surgical History:  Procedure Laterality Date   ABDOMINAL HYSTERECTOMY     APPENDECTOMY     CARPAL TUNNEL RELEASE Right    CHOLECYSTECTOMY     COLONOSCOPY     eye lid surgery     KNEE SURGERY Left     Family History  Problem Relation Age of Onset   Colon cancer Daughter    Diabetes type II Mother    Hyperlipidemia Mother    Hypertension Mother    Heart attack Father    Hyperlipidemia Father    Hypertension Father    Stroke Father    Diabetes type II Father    Esophageal cancer Neg Hx    Rectal cancer Neg Hx    Stomach cancer Neg Hx     Social History   Socioeconomic History   Marital status: Married    Spouse name: Not on file   Number of children: 4   Years of education: Not on file   Highest education level: Not on file  Occupational History   Occupation: Homemaker  Tobacco Use   Smoking status: Never Smoker   Smokeless  tobacco: Never Used  Scientific laboratory technician Use: Never used  Substance and Sexual Activity   Alcohol use: Never   Drug use: Never   Sexual activity: Not on file  Other Topics Concern   Not on file  Social History Narrative   Not on file   Social Determinants of Health   Financial Resource Strain:    Difficulty of Paying Living Expenses:   Food Insecurity:    Worried About Charity fundraiser in the Last Year:    Arboriculturist in the Last Year:   Transportation Needs:    Film/video editor (Medical):    Lack of Transportation (Non-Medical):   Physical Activity:    Days of Exercise per Week:    Minutes of Exercise per Session:   Stress:    Feeling of Stress :   Social Connections:    Frequency of Communication with Friends and Family:    Frequency of Social Gatherings with Friends and Family:    Attends Religious Services:    Active Member of Clubs or Organizations:    Attends Archivist Meetings:  Marital Status:   Intimate Partner Violence:    Fear of Current or Ex-Partner:    Emotionally Abused:    Physically Abused:    Sexually Abused:     Outpatient Medications Prior to Visit  Medication Sig Dispense Refill   Acetaminophen (TYLENOL PO) Take by mouth as needed.     aspirin 81 MG EC tablet Take 81 mg by mouth daily. Swallow whole.     calcium-vitamin D (OSCAL WITH D) 500-200 MG-UNIT tablet Take 1 tablet by mouth.     Cetirizine HCl (ZYRTEC ALLERGY) 10 MG CAPS Take by mouth daily.     dextromethorphan-guaiFENesin (MUCINEX DM) 30-600 MG 12hr tablet Take 1 tablet by mouth 2 (two) times daily. 60 tablet 0   FLUoxetine (PROZAC) 40 MG capsule Take 1 capsule by mouth once daily 90 capsule 0   losartan (COZAAR) 100 MG tablet Take 1 tablet (100 mg total) by mouth daily. 90 tablet 1   metFORMIN (GLUCOPHAGE) 1000 MG tablet Take 1 tablet by mouth twice daily 180 tablet 0   omeprazole (PRILOSEC) 40 MG capsule Take 1 capsule by  mouth twice daily 180 capsule 0   simvastatin (ZOCOR) 40 MG tablet TAKE 1 TABLET BY MOUTH AT BEDTIME 90 tablet 0   Zinc 50 MG CAPS Take by mouth.     losartan-hydrochlorothiazide (HYZAAR) 50-12.5 MG tablet Take 1 tablet by mouth daily.     No facility-administered medications prior to visit.    Allergies  Allergen Reactions   Meloxicam     Review of Systems  Constitutional: Negative for chills, fatigue and fever.  HENT: Negative for congestion, ear pain and sore throat.   Respiratory: Negative for cough and shortness of breath.        Orthopnea.  Cardiovascular: Positive for leg swelling. Negative for chest pain.       Objective:    Physical Exam Vitals reviewed.  Constitutional:      Appearance: Normal appearance. She is normal weight.  Cardiovascular:     Rate and Rhythm: Normal rate and regular rhythm.     Pulses: Normal pulses.     Heart sounds: Normal heart sounds.  Pulmonary:     Effort: Pulmonary effort is normal. No respiratory distress.     Breath sounds: Normal breath sounds.  Abdominal:     General: Abdomen is flat. Bowel sounds are normal.     Palpations: Abdomen is soft.     Tenderness: There is no abdominal tenderness.  Musculoskeletal:     Right lower leg: Edema present.     Left lower leg: Edema present.     Comments: Negative homans.  Neurological:     Mental Status: She is alert and oriented to person, place, and time.  Psychiatric:        Mood and Affect: Mood normal.        Behavior: Behavior normal.     BP (!) 144/60    Pulse 88    Temp 97.7 F (36.5 C)    Resp 16    Ht 5\' 2"  (1.575 m)    Wt 149 lb 9.6 oz (67.9 kg)    BMI 27.36 kg/m  Wt Readings from Last 3 Encounters:  01/20/20 149 lb 9.6 oz (67.9 kg)  11/09/19 152 lb (68.9 kg)  08/16/19 151 lb 6.4 oz (68.7 kg)    Health Maintenance Due  Topic Date Due   Hepatitis C Screening  Never done   FOOT EXAM  Never done   DEXA SCAN  07/21/2019   MAMMOGRAM  07/22/2019   INFLUENZA  VACCINE  01/15/2020    There are no preventive care reminders to display for this patient.   Lab Results  Component Value Date   TSH 3.170 08/11/2019   Lab Results  Component Value Date   WBC 6.0 11/09/2019   HGB 10.9 (L) 11/09/2019   HCT 35.2 11/09/2019   MCV 86 11/09/2019   PLT 174 11/09/2019   Lab Results  Component Value Date   NA 143 11/09/2019   K 5.2 11/09/2019   CO2 21 11/09/2019   GLUCOSE 125 (H) 11/09/2019   BUN 19 11/09/2019   CREATININE 1.16 (H) 11/09/2019   BILITOT 0.6 11/09/2019   ALKPHOS 63 11/09/2019   AST 13 11/09/2019   ALT 9 11/09/2019   PROT 6.9 11/09/2019   ALBUMIN 4.7 11/09/2019   CALCIUM 10.1 11/09/2019   Lab Results  Component Value Date   CHOL 158 11/09/2019   Lab Results  Component Value Date   HDL 47 11/09/2019   Lab Results  Component Value Date   LDLCALC 82 11/09/2019   Lab Results  Component Value Date   TRIG 167 (H) 11/09/2019   Lab Results  Component Value Date   CHOLHDL 3.4 11/09/2019   Lab Results  Component Value Date   HGBA1C 6.5 (H) 11/09/2019       Assessment & Plan:  1. Pedal edema  Recommend elevation of legs.  Decrease salt.  Recommend and get compression stockings from [employer] store. Prescription for hydrochlorothiazide 25 mg once daily prescribed patient may take this over the next 2 to 4 weeks and then discontinue.   Meds ordered this encounter  Medications   hydrochlorothiazide (HYDRODIURIL) 25 MG tablet    Sig: Take 1 tablet (25 mg total) by mouth daily.    Dispense:  30 tablet    Refill:  1    No orders of the defined types were placed in this encounter.    Follow-up: No follow-ups on file.  An After Visit Summary was printed and given to the patient.  [name] [facility] [phone]

## 2020-02-01 DIAGNOSIS — H25813 Combined forms of age-related cataract, bilateral: Secondary | ICD-10-CM | POA: Diagnosis not present

## 2020-02-01 DIAGNOSIS — E119 Type 2 diabetes mellitus without complications: Secondary | ICD-10-CM | POA: Diagnosis not present

## 2020-02-01 DIAGNOSIS — D3131 Benign neoplasm of right choroid: Secondary | ICD-10-CM | POA: Diagnosis not present

## 2020-02-01 DIAGNOSIS — H04203 Unspecified epiphora, bilateral lacrimal glands: Secondary | ICD-10-CM | POA: Diagnosis not present

## 2020-02-09 DIAGNOSIS — H04203 Unspecified epiphora, bilateral lacrimal glands: Secondary | ICD-10-CM | POA: Diagnosis not present

## 2020-02-09 LAB — HM DIABETES EYE EXAM

## 2020-03-05 DIAGNOSIS — S46011A Strain of muscle(s) and tendon(s) of the rotator cuff of right shoulder, initial encounter: Secondary | ICD-10-CM | POA: Diagnosis not present

## 2020-03-06 ENCOUNTER — Telehealth: Payer: Self-pay | Admitting: Family Medicine

## 2020-03-06 NOTE — Chronic Care Management (AMB) (Signed)
°  Chronic Care Management   Note  03/06/2020 Name: ALESANA MAGISTRO MRN: [id number] DOB: 1945-12-29  [name] is a 74 y.o. year old female who is a primary care patient of Cox, Kirsten, MD. I reached out to [name] by phone today in response to a referral sent by [name] PCP, Cox, Kirsten, MD.   Ms. Morrissey was given information about Chronic Care Management services today including:  1. CCM service includes personalized support from designated clinical staff supervised by her physician, including individualized plan of care and coordination with other care providers 2. 24/7 contact phone numbers for assistance for urgent and routine care needs. 3. Service will only be billed when office clinical staff spend 20 minutes or more in a month to coordinate care. 4. Only one practitioner may furnish and bill the service in a calendar month. 5. The patient may stop CCM services at any time (effective at the end of the month) by phone call to the office staff.   Patient agreed to services and verbal consent obtained.   Follow up plan:   [place]

## 2020-03-12 ENCOUNTER — Other Ambulatory Visit: Payer: Self-pay | Admitting: Physician Assistant

## 2020-03-12 ENCOUNTER — Other Ambulatory Visit: Payer: Self-pay | Admitting: Family Medicine

## 2020-03-15 ENCOUNTER — Other Ambulatory Visit: Payer: Self-pay

## 2020-03-15 ENCOUNTER — Encounter: Payer: Self-pay | Admitting: Family Medicine

## 2020-03-15 ENCOUNTER — Ambulatory Visit (INDEPENDENT_AMBULATORY_CARE_PROVIDER_SITE_OTHER): Payer: PPO | Admitting: Family Medicine

## 2020-03-15 VITALS — BP 124/68 | HR 88 | Temp 97.1°F | Resp 16 | Ht 62.0 in | Wt 143.0 lb

## 2020-03-15 DIAGNOSIS — M25562 Pain in left knee: Secondary | ICD-10-CM | POA: Diagnosis not present

## 2020-03-15 DIAGNOSIS — E1169 Type 2 diabetes mellitus with other specified complication: Secondary | ICD-10-CM

## 2020-03-15 DIAGNOSIS — E785 Hyperlipidemia, unspecified: Secondary | ICD-10-CM

## 2020-03-15 MED ORDER — TRIAMCINOLONE ACETONIDE 40 MG/ML IJ SUSP: 80.0000 mg | Freq: Once | INTRAMUSCULAR | Status: DC

## 2020-03-15 NOTE — Progress Notes (Signed)
Acute Office Visit  Subjective:    Patient ID: Alicia Jordan, female    DOB: 1946-01-29, 74 y.o.   MRN: [id number]  Chief Complaint  Patient presents with  . Knee Pain    left     HPI Patient is in today for acute onset of left knee pain. Was stepping up a step and had sudden pain. No pop. Feels like it is going to given out.   Past Medical History:  Diagnosis Date  . Allergy   . Anxiety   . Arthralgia of left temporomandibular joint   . Arthritis   . Cervicalgia   . Chronic kidney disease, stage 3a   . Depression   . Diabetes mellitus without complication ([place])   . Hesitancy of micturition   . Hyperlipidemia   . Hypertension   . Osteoporosis   . Other psoriasis   . Other specified menopausal and perimenopausal disorders   . Overweight   . Repeated falls   . Residual hemorrhoidal skin tags     Past Surgical History:  Procedure Laterality Date  . ABDOMINAL HYSTERECTOMY    . APPENDECTOMY    . CARPAL TUNNEL RELEASE Right   . CHOLECYSTECTOMY    . COLONOSCOPY    . eye lid surgery    . KNEE SURGERY Left     Family History  Problem Relation Age of Onset  . Colon cancer Daughter   . Diabetes type II Mother   . Hyperlipidemia Mother   . Hypertension Mother   . Heart attack Father   . Hyperlipidemia Father   . Hypertension Father   . Stroke Father   . Diabetes type II Father   . Esophageal cancer Neg Hx   . Rectal cancer Neg Hx   . Stomach cancer Neg Hx     Social History   Socioeconomic History  . Marital status: Married    Spouse name: Not on file  . Number of children: 4  . Years of education: Not on file  . Highest education level: Not on file  Occupational History  . Occupation: Homemaker  Tobacco Use  . Smoking status: Never Smoker  . Smokeless tobacco: Never Used  Vaping Use  . Vaping Use: Never used  Substance and Sexual Activity  . Alcohol use: Never  . Drug use: Never  . Sexual activity: Not on file  Other Topics Concern  . Not  on file  Social History Narrative  . Not on file   Social Determinants of Health   Financial Resource Strain:   . Difficulty of Paying Living Expenses: Not on file  Food Insecurity:   . Worried About Charity fundraiser in the Last Year: Not on file  . Ran Out of Food in the Last Year: Not on file  Transportation Needs:   . Lack of Transportation (Medical): Not on file  . Lack of Transportation (Non-Medical): Not on file  Physical Activity:   . Days of Exercise per Week: Not on file  . Minutes of Exercise per Session: Not on file  Stress:   . Feeling of Stress : Not on file  Social Connections:   . Frequency of Communication with Friends and Family: Not on file  . Frequency of Social Gatherings with Friends and Family: Not on file  . Attends Religious Services: Not on file  . Active Member of Clubs or Organizations: Not on file  . Attends Archivist Meetings: Not on file  . Marital Status:  Not on file  Intimate Partner Violence:   . Fear of Current or Ex-Partner: Not on file  . Emotionally Abused: Not on file  . Physically Abused: Not on file  . Sexually Abused: Not on file    Outpatient Medications Prior to Visit  Medication Sig Dispense Refill  . Acetaminophen (TYLENOL PO) Take by mouth as needed.    Marland Kitchen aspirin 81 MG EC tablet Take 81 mg by mouth daily. Swallow whole.    . calcium-vitamin D (OSCAL WITH D) 500-200 MG-UNIT tablet Take 1 tablet by mouth.    . Cetirizine HCl (ZYRTEC ALLERGY) 10 MG CAPS Take by mouth daily.    Marland Kitchen dextromethorphan-guaiFENesin (MUCINEX DM) 30-600 MG 12hr tablet Take 1 tablet by mouth 2 (two) times daily. 60 tablet 0  . FLUoxetine (PROZAC) 40 MG capsule Take 1 capsule by mouth once daily 90 capsule 0  . hydrochlorothiazide (HYDRODIURIL) 25 MG tablet Take 1 tablet by mouth once daily 90 tablet 1  . losartan (COZAAR) 100 MG tablet Take 1 tablet (100 mg total) by mouth daily. 90 tablet 1  . metFORMIN (GLUCOPHAGE) 1000 MG tablet Take 1 tablet  by mouth twice daily 180 tablet 0  . omeprazole (PRILOSEC) 40 MG capsule Take 1 capsule by mouth twice daily 180 capsule 0  . simvastatin (ZOCOR) 40 MG tablet TAKE 1 TABLET BY MOUTH AT BEDTIME 90 tablet 0   No facility-administered medications prior to visit.    Allergies  Allergen Reactions  . Meloxicam     Review of Systems  Constitutional: Negative for chills, fatigue and fever.  HENT: Negative for congestion, ear pain and sore throat.   Respiratory: Negative for cough and shortness of breath.   Cardiovascular: Negative for chest pain.       Objective:    Physical Exam Musculoskeletal:     Right knee: Tenderness present over the patellar tendon (superiorly). No LCL laxity, MCL laxity, ACL laxity or PCL laxity.     Instability Tests: Anterior drawer test negative. Medial McMurray test positive and lateral McMurray test positive.     BP 124/68   Pulse 88   Temp (!) 97.1 F (36.2 C)   Resp 16   Ht 5\' 2"  (1.575 m)   Wt 143 lb (64.9 kg)   BMI 26.16 kg/m  Wt Readings from Last 3 Encounters:  03/15/20 143 lb (64.9 kg)  01/20/20 149 lb 9.6 oz (67.9 kg)  11/09/19 152 lb (68.9 kg)    Health Maintenance Due  Topic Date Due  . Hepatitis C Screening  Never done  . FOOT EXAM  Never done  . DEXA SCAN  07/21/2019  . MAMMOGRAM  07/22/2019  . OPHTHALMOLOGY EXAM  01/26/2020    There are no preventive care reminders to display for this patient.   Lab Results  Component Value Date   TSH 3.170 08/11/2019   Lab Results  Component Value Date   WBC 6.0 11/09/2019   HGB 10.9 (L) 11/09/2019   HCT 35.2 11/09/2019   MCV 86 11/09/2019   PLT 174 11/09/2019   Lab Results  Component Value Date   NA 143 11/09/2019   K 5.2 11/09/2019   CO2 21 11/09/2019   GLUCOSE 125 (H) 11/09/2019   BUN 19 11/09/2019   CREATININE 1.16 (H) 11/09/2019   BILITOT 0.6 11/09/2019   ALKPHOS 63 11/09/2019   AST 13 11/09/2019   ALT 9 11/09/2019   PROT 6.9 11/09/2019   ALBUMIN 4.7 11/09/2019     CALCIUM  10.1 11/09/2019   Lab Results  Component Value Date   CHOL 158 11/09/2019   Lab Results  Component Value Date   HDL 47 11/09/2019   Lab Results  Component Value Date   LDLCALC 82 11/09/2019   Lab Results  Component Value Date   TRIG 167 (H) 11/09/2019   Lab Results  Component Value Date   CHOLHDL 3.4 11/09/2019   Lab Results  Component Value Date   HGBA1C 6.5 (H) 11/09/2019       Assessment & Plan:  1. Acute pain of left knee - DG Knee Complete 4 Views Left; Future - triamcinolone acetonide (KENALOG-40) injection 80 mg Risks were discussed including bleeding, infection, increase in sugars if diabetic, atrophy at site of injection, and increased pain.  After consent was obtained, using sterile technique the left knee was prepped with betadine and alcohol.  Kenalog 80 mg and 5 ml plain Lidocaine was then injected and the needle withdrawn.  The procedure was well tolerated.   The patient is asked to continue to rest the joint for a few more days before resuming regular activities.  It may be more painful for the first 1-2 days.  Watch for fever, or increased swelling or persistent pain in the joint. Call or return to clinic prn if such symptoms occur or there is failure to improve as anticipated.   2. Diabetes - given tradjenta 5 mg once daily if sugars > 200. Continue metformin. Sugars running 125-200.   Meds ordered this encounter  Medications  . triamcinolone acetonide (KENALOG-40) injection 80 mg    Orders Placed This Encounter  Procedures  . DG Knee Complete 4 Views Left     Follow-up: No follow-ups on file.  An After Visit Summary was printed and given to the patient.  [name] [facility] [phone]

## 2020-03-19 ENCOUNTER — Encounter: Payer: Self-pay | Admitting: Family Medicine

## 2020-03-19 DIAGNOSIS — M1712 Unilateral primary osteoarthritis, left knee: Secondary | ICD-10-CM | POA: Diagnosis not present

## 2020-03-19 DIAGNOSIS — M25562 Pain in left knee: Secondary | ICD-10-CM | POA: Diagnosis not present

## 2020-03-22 ENCOUNTER — Other Ambulatory Visit: Payer: Self-pay

## 2020-03-22 DIAGNOSIS — M25562 Pain in left knee: Secondary | ICD-10-CM

## 2020-03-22 DIAGNOSIS — M1711 Unilateral primary osteoarthritis, right knee: Secondary | ICD-10-CM | POA: Diagnosis not present

## 2020-03-29 NOTE — Chronic Care Management (AMB) (Signed)
Chronic Care Management Pharmacy  Name: Alicia Jordan  MRN: [id number] DOB: 1945-08-24  Chief Complaint/ HPI  Alicia Jordan,  74 y.o. , female presents for their Initial CCM visit with the clinical pharmacist In office.  PCP : [name], MD  Their chronic conditions include: hypertension, GERD, dyslipidemia, type 2 diabetes, hyperlipidemia, pedal edema.   Office Visits: 03/15/2020 - acute pain of left knee. Kenalog shot given. Tradjenta 5 mg once daily if sugars >200. Continue metformin. Sugars running 125-200 mg/dL.  01/20/2020 - Elevation of legs and decrease salt to decrease swelling. Compression hose. HCTZ prescribed for the next 2-4 weeks.  11/09/2019 - continue physical therapy. Continue tylenol.  Consult Visit: 12/08/2019 - Ortho visit - no note available. Patient in physical therapy.   Medications: Outpatient Encounter Medications as of 04/03/2020  Medication Sig  . Acetaminophen (TYLENOL PO) Take by mouth as needed.  Marland Kitchen aspirin 81 MG EC tablet Take 81 mg by mouth daily. Swallow whole.  . calcium-vitamin D (OSCAL WITH D) 500-200 MG-UNIT tablet Take 1 tablet by mouth.  . Cetirizine HCl (ZYRTEC ALLERGY) 10 MG CAPS Take by mouth daily.  Marland Kitchen dextromethorphan-guaiFENesin (MUCINEX DM) 30-600 MG 12hr tablet Take 1 tablet by mouth 2 (two) times daily.  Marland Kitchen FLUoxetine (PROZAC) 40 MG capsule Take 1 capsule by mouth once daily  . hydrochlorothiazide (HYDRODIURIL) 25 MG tablet Take 1 tablet by mouth once daily  . losartan (COZAAR) 100 MG tablet Take 1 tablet (100 mg total) by mouth daily.  . metFORMIN (GLUCOPHAGE) 1000 MG tablet Take 1 tablet by mouth twice daily  . omeprazole (PRILOSEC) 40 MG capsule Take 1 capsule by mouth twice daily  . simvastatin (ZOCOR) 40 MG tablet TAKE 1 TABLET BY MOUTH AT BEDTIME   Facility-Administered Encounter Medications as of 04/03/2020  Medication  . triamcinolone acetonide (KENALOG-40) injection 80 mg   Allergies  Allergen Reactions  .  Meloxicam    SDOH Screenings   Alcohol Screen:   . Last Alcohol Screening Score (AUDIT): Not on file  Depression (PHQ2-9): Medium Risk  . PHQ-2 Score: 8  Financial Resource Strain:   . Difficulty of Paying Living Expenses: Not on file  Food Insecurity: No Food Insecurity  . Worried About Charity fundraiser in the Last Year: Never true  . Ran Out of Food in the Last Year: Never true  Housing: Low Risk   . Last Housing Risk Score: 0  Physical Activity:   . Days of Exercise per Week: Not on file  . Minutes of Exercise per Session: Not on file  Social Connections:   . Frequency of Communication with Friends and Family: Not on file  . Frequency of Social Gatherings with Friends and Family: Not on file  . Attends Religious Services: Not on file  . Active Member of Clubs or Organizations: Not on file  . Attends Archivist Meetings: Not on file  . Marital Status: Not on file  Stress:   . Feeling of Stress : Not on file  Tobacco Use: Low Risk   . Smoking Tobacco Use: Never Smoker  . Smokeless Tobacco Use: Never Used  Transportation Needs:   . Film/video editor (Medical): Not on file  . Lack of Transportation (Non-Medical): Not on file     Current Diagnosis/Assessment:  Goals Addressed            This Visit's Progress   . Pharmacy Care Plan       CARE PLAN ENTRY (see  longitudinal plan of care for additional care plan information)  Current Barriers:  . Chronic Disease Management support, education, and care coordination needs related to Hypertension, Hyperlipidemia, Diabetes, and Depression   Hypertension BP Readings from Last 3 Encounters:  03/15/20 124/68  01/20/20 (!) 144/60  11/09/19 132/64   . Pharmacist Clinical Goal(s): o Over the next 90 days, patient will work with PharmD and providers to maintain BP goal <130/80 . Current regimen:  o Losartan 100 mg daily o Hydrochlorothiazide 25 mg daily  . Interventions: o Discussed medications and blood  pressure control.  o Patient prefers a combination product if possible.  . Patient self care activities - Over the next 90 days, patient will: o Check BP as needed, document, and provide at future appointments o Ensure daily salt intake < 2300 mg/day  Hyperlipidemia Lab Results  Component Value Date/Time   LDLCALC 82 11/09/2019 09:53 AM   . Pharmacist Clinical Goal(s): o Over the next 90 days, patient will work with PharmD and providers to maintain LDL goal < 100 . Current regimen:  o Simvastatin 40 mg daily  . Interventions: o Medication list reviewed.  o Reviewed generic Lovaza coverage as a more affordable option for patient vs. Vascepa. Patient prefers to wait to begin anything new until after updated lab results next month.  . Patient self care activities - Over the next 90 days, patient will: o Continue taking medications as prescribed.   Diabetes Lab Results  Component Value Date/Time   HGBA1C 6.5 (H) 11/09/2019 09:53 AM   HGBA1C 6.6 (H) 08/11/2019 09:33 AM   . Pharmacist Clinical Goal(s): o Over the next 90 days, patient will work with PharmD and providers to maintain A1c goal <7% . Current regimen:  o Metformin 1000 mg bid . Interventions: o Reviewed home blood sugar readings.  o Patient denies the need for Tradjenta at this time. Pharmacist will coordinate patient assistance application if needed after next blood work.  . Patient self care activities - Over the next 90 days, patient will: o Check blood sugar once daily, document, and provide at future appointments o Contact provider with any episodes of hypoglycemia  Depression . Pharmacist Clinical Goal(s) o Over the next 90 days, patient will work with PharmD and providers to manage symptoms of depression . Current regimen:  o Fluoxetine 40 mg daily  . Interventions: o Reviewed patient's current medication and symptoms.  o Discussed the option of adding an additional agent or changing therapy.  o Pharmacist  will consult with PCP for treatment plan.  . Patient self care activities - Over the next 90 days, patient will: o Continue to take medication as prescribed.   Medication management . Pharmacist Clinical Goal(s): o Over the next 90 days, patient will work with PharmD and providers to maintain optimal medication adherence . Current pharmacy: [facility] . Interventions o Comprehensive medication review performed. o Continue current medication management strategy . Patient self care activities - Over the next 90 days, patient will: o Focus on medication adherence by pill box o Take medications as prescribed o Report any questions or concerns to PharmD and/or provider(s)  Initial goal documentation        Diabetes   Recent Relevant Labs: Lab Results  Component Value Date/Time   HGBA1C 6.5 (H) 11/09/2019 09:53 AM   HGBA1C 6.6 (H) 08/11/2019 09:33 AM   MICROALBUR 80 08/11/2019 09:50 AM     Checking BG: Daily  Recent FBG Readings:140-150 mg/dL  Patient is currently controlled  on the following medications:   Metformin 1000 mg bid  Last diabetic Foot exam:  Lab Results  Component Value Date/Time   HMDIABEYEEXA No Retinopathy 01/26/2019 12:00 AM    Last diabetic Eye exam: No results found for: HMDIABFOOTEX   We discussed: diet and exercise extensively   Diet: Patient controls food portions.   Exercise: Patient is unable to exercise at this time due to knee pain. She sees ortho for evaluation 04/17/2020.   Patient denies the need for help affording Tradjenta at this time. Will reevaluate after next lab results. Patient had 2 steroid injections in her knee that drove her blood sugar up. Short-term Tradjenta was well tolerated and improved blood sugar.   Plan  Continue current medications and  Hypertension   BP today is:  <130/80  Office blood pressures are  BP Readings from Last 3 Encounters:  03/15/20 124/68  01/20/20 (!) 144/60  11/09/19 132/64     Patient has failed these meds in the past: none reported Patient is currently controlled on the following medications:   Hydrochlorothiazide 25 mg daily  Losartan 100 mg daily  Patient checks BP at home when feeling symptomatic  Patient home BP readings are ranging: uncertain  We discussed diet and exercise extensively   Patient is interested in combination therapy if Dr. Tobie Poet approves Losartan-HCTZ 100-25 mg daily.   Plan  Continue current medications. Begin taking Losartan 100-25 mg daily once approved.    Hyperlipidemia   LDL goal < 100 mg/dL  Lipid Panel     Component Value Date/Time   CHOL 158 11/09/2019 0953   TRIG 167 (H) 11/09/2019 0953   HDL 47 11/09/2019 0953   LDLCALC 82 11/09/2019 0953    Hepatic Function Latest Ref Rng & Units 11/09/2019 08/11/2019  Total Protein 6.0 - 8.5 g/dL 6.9 7.0  Albumin 3.7 - 4.7 g/dL 4.7 4.8(H)  AST 0 - 40 IU/L 13 13  ALT 0 - 32 IU/L 9 -  Alk Phosphatase 48 - 121 IU/L 63 65  Total Bilirubin 0.0 - 1.2 mg/dL 0.6 0.8     The 10-year ASCVD risk score Mikey Bussing DC Jr., et al., 2013) is: 25.6%   Values used to calculate the score:     Age: 85 years     Sex: Female     Is Non-Hispanic African American: No     Diabetic: Yes     Tobacco smoker: No     Systolic Blood Pressure: 539 mmHg     Is BP treated: Yes     HDL Cholesterol: 47 mg/dL     Total Cholesterol: 158 mg/dL   Patient has failed these meds in past: Questran, vascepa Patient is currently controlled on the following medications:  . Simvastatin 40 mg daily  We discussed:  diet and exercise extensively. Consider generic Lovaza after next month's blood work. Patient's plan covers Vascepa as a tier 4 which is too expensive for patient.    Plan  Continue current medications  Anxiety and Depression   Patient has failed these meds in past: none reported Patient is currently uncontrolled on the following medications:  . Fluoxetine 40 mg daily  We discussed:  Patient is  helping her daughter through cancer treatment and getting to appointments at [facility]. Patient states that the cancer is incurable. She indicates that this is quite stressful for her. She reports not wanting to participate in normal activities at times. She feels like she needs an increase in dose or alternative medication.  We discussed buspirone as an option for as needed anxiety. Patient states as her daughter's disease progresses she will likely need more pharmacotherapy.   Plan  Consider alternate therapy.    GERD   Patient has failed these meds in past: none reported Patient is currently controlled on the following medications:  . Omeprazole 40 mg bid  We discussed:  Patient reports that symptoms are well controlled with current therapy. She indicates a strong family history of stomach issues. She has breakthrough with less dosing.   Plan  Continue current medications   Osteopenia     No results found for: VD25OH   Patient has failed these meds in past: none reported Patient is currently uncontrolled on the following medications:  . Calcium-vitamin D daily   We discussed:  Recommend [phone] units of vitamin D daily. Recommend 1200 mg of calcium daily from dietary and supplemental sources. Recommend weight-bearing and muscle strengthening exercises for building and maintaining bone density.   Patient reports that her dexa scan and mammogram were not completed earlier this year when ordered. Pharmacist reached out to referral coordinator to follow-up.   Plan  Continue current medications   Health Maintenance   Patient is currently controlled on the following medications:  . Acetaminophen 1000 mg bid prn - shoulder pain . Cetirzine 10 mg daily prn - allergy   We discussed:  Patient reports her knee pain is worsened. She will follow-up with ortho 04/17/2020. She takes tylenol as needed for arthritis knee pain.  Plan  Continue current medications  Vaccines    Reviewed and discussed patient's vaccination history.    Immunization History  Administered Date(s) Administered  . Fluad Quad(high Dose 65+) 02/08/2019  . Influenza-Unspecified 02/27/2020  . Moderna SARS-COVID-2 Vaccination 10/31/2019, 11/28/2019  . Pneumococcal Conjugate-13 05/13/2014  . Pneumococcal Polysaccharide-23 01/29/2017  . Tdap 05/03/2012  . Zoster 05/16/2010  . Zoster Recombinat (Shingrix) 06/11/2017    Plan  Recommended patient receive third COVID vaccine in office. .   Medication Management   Pt uses [facility] for all medications Uses pill box? Yes Pt fill history is 100% for simvastatin and metformin. 50-59% on losartan per adherence data.   We discussed: Discussed benefits of medication synchronization, packaging and delivery as well as enhanced pharmacist oversight with [facility]. Patient denies the need for coordination of pharmacy delivery at this time. Enjoys getting out of the house and going to [place].   Plan  Continue current medication management strategy    Follow up: 1 month phone visit

## 2020-04-03 ENCOUNTER — Ambulatory Visit: Payer: PPO

## 2020-04-03 ENCOUNTER — Other Ambulatory Visit: Payer: Self-pay

## 2020-04-03 DIAGNOSIS — Z1231 Encounter for screening mammogram for malignant neoplasm of breast: Secondary | ICD-10-CM

## 2020-04-03 DIAGNOSIS — E785 Hyperlipidemia, unspecified: Secondary | ICD-10-CM

## 2020-04-03 DIAGNOSIS — E1169 Type 2 diabetes mellitus with other specified complication: Secondary | ICD-10-CM

## 2020-04-03 DIAGNOSIS — I10 Essential (primary) hypertension: Secondary | ICD-10-CM

## 2020-04-04 ENCOUNTER — Other Ambulatory Visit: Payer: Self-pay | Admitting: Family Medicine

## 2020-04-04 MED ORDER — LOSARTAN POTASSIUM-HCTZ 100-25 MG PO TABS: 1.0000 | ORAL_TABLET | Freq: Every day | ORAL | 1 refills | Status: DC

## 2020-04-04 MED ORDER — FLUOXETINE HCL 20 MG PO TABS: 60.0000 mg | ORAL_TABLET | Freq: Every day | ORAL | 3 refills | Status: DC

## 2020-04-04 NOTE — Patient Instructions (Addendum)
Visit Information  Thank you for your time discussing your medications. I look forward to working with you to achieve your health care goals. Below is a summary of what we talked about during our visit.   Goals Addressed            This Visit's Progress   . Pharmacy Care Plan       CARE PLAN ENTRY (see longitudinal plan of care for additional care plan information)  Current Barriers:  . Chronic Disease Management support, education, and care coordination needs related to Hypertension, Hyperlipidemia, Diabetes, and Depression   Hypertension BP Readings from Last 3 Encounters:  03/15/20 124/68  01/20/20 (!) 144/60  11/09/19 132/64   . Pharmacist Clinical Goal(s): o Over the next 90 days, patient will work with PharmD and providers to maintain BP goal <130/80 . Current regimen:  o Losartan 100 mg daily o Hydrochlorothiazide 25 mg daily  . Interventions: o Discussed medications and blood pressure control.  o Patient prefers a combination product if possible.  . Patient self care activities - Over the next 90 days, patient will: o Check BP as needed, document, and provide at future appointments o Ensure daily salt intake < 2300 mg/day  Hyperlipidemia Lab Results  Component Value Date/Time   LDLCALC 82 11/09/2019 09:53 AM   . Pharmacist Clinical Goal(s): o Over the next 90 days, patient will work with PharmD and providers to maintain LDL goal < 100 . Current regimen:  o Simvastatin 40 mg daily  . Interventions: o Medication list reviewed.  o Reviewed generic Lovaza coverage as a more affordable option for patient vs. Vascepa. Patient prefers to wait to begin anything new until after updated lab results next month.  . Patient self care activities - Over the next 90 days, patient will: o Continue taking medications as prescribed.   Diabetes Lab Results  Component Value Date/Time   HGBA1C 6.5 (H) 11/09/2019 09:53 AM   HGBA1C 6.6 (H) 08/11/2019 09:33 AM   . Pharmacist  Clinical Goal(s): o Over the next 90 days, patient will work with PharmD and providers to maintain A1c goal <7% . Current regimen:  o Metformin 1000 mg bid . Interventions: o Reviewed home blood sugar readings.  o Patient denies the need for Tradjenta at this time. Pharmacist will coordinate patient assistance application if needed after next blood work.  . Patient self care activities - Over the next 90 days, patient will: o Check blood sugar once daily, document, and provide at future appointments o Contact provider with any episodes of hypoglycemia  Depression . Pharmacist Clinical Goal(s) o Over the next 90 days, patient will work with PharmD and providers to manage symptoms of depression . Current regimen:  o Fluoxetine 40 mg daily  . Interventions: o Reviewed patient's current medication and symptoms.  o Discussed the option of adding an additional agent or changing therapy.  o Pharmacist will consult with PCP for treatment plan.  . Patient self care activities - Over the next 90 days, patient will: o Continue to take medication as prescribed.   Medication management . Pharmacist Clinical Goal(s): o Over the next 90 days, patient will work with PharmD and providers to maintain optimal medication adherence . Current pharmacy: [facility] . Interventions o Comprehensive medication review performed. o Continue current medication management strategy . Patient self care activities - Over the next 90 days, patient will: o Focus on medication adherence by pill box o Take medications as prescribed o Report any questions  or concerns to PharmD and/or provider(s)  Initial goal documentation        Alicia Jordan was given information about Chronic Care Management services today including:  1. CCM service includes personalized support from designated clinical staff supervised by her physician, including individualized plan of care and coordination with other care  providers 2. 24/7 contact phone numbers for assistance for urgent and routine care needs. 3. Standard insurance, coinsurance, copays and deductibles apply for chronic care management only during months in which we provide at least 20 minutes of these services. Most insurances cover these services at 100%, however patients may be responsible for any copay, coinsurance and/or deductible if applicable. This service may help you avoid the need for more expensive face-to-face services. 4. Only one practitioner may furnish and bill the service in a calendar month. 5. The patient may stop CCM services at any time (effective at the end of the month) by phone call to the office staff.  Patient agreed to services and verbal consent obtained.   The patient verbalized understanding of instructions provided today and agreed to receive a mailed copy of patient instruction and/or educational materials. Telephone follow up appointment with pharmacy team member scheduled for:04/2020  Sherre Poot, PharmD Clinical Pharmacist [place] [phone] (office) [phone] (mobile)   Exercises To Do While Sitting  Exercises that you do while sitting (chair exercises) can give you many of the same benefits as full exercise. Benefits include strengthening your heart, burning calories, and keeping muscles and joints healthy. Exercise can also improve your mood and help with depression and anxiety. You may benefit from chair exercises if you are unable to do standing exercises because of:  Diabetic foot pain.  Obesity.  Illness.  Arthritis.  Recovery from surgery or injury.  Breathing problems.  Balance problems.  Another type of disability. Before starting chair exercises, check with your health care provider or a physical therapist to find out how much exercise you can tolerate and which exercises are safe for you. If your health care provider approves:  Start out slowly and build up  over time. Aim to work up to about 10-20 minutes for each exercise session.  Make exercise part of your daily routine.  Drink water when you exercise. Do not wait until you are thirsty. Drink every 10-15 minutes.  Stop exercising right away if you have pain, nausea, shortness of breath, or dizziness.  If you are exercising in a wheelchair, make sure to lock the wheels.  Ask your health care provider whether you can do tai chi or yoga. Many positions in these mind-body exercises can be modified to do while seated. Warm-up Before starting other exercises: 1. Sit up as straight as you can. Have your knees bent at 90 degrees, which is the shape of the capital letter "L." Keep your feet flat on the floor. 2. Sit at the front edge of your chair, if you can. 3. Pull in (tighten) the muscles in your abdomen and stretch your spine and neck as straight as you can. Hold this position for a few minutes. 4. Breathe in and out evenly. Try to concentrate on your breathing, and relax your mind. Stretching Exercise A: Arm stretch 1. Hold your arms out straight in front of your body. 2. Bend your hands at the wrist with your fingers pointing up, as if signaling someone to stop. Notice the slight tension in your forearms as you hold the position. 3. Keeping your arms out and your  hands bent, rotate your hands outward as far as you can and hold this stretch. Aim to have your thumbs pointing up and your pinkie fingers pointing down. Slowly repeat arm stretches for one minute as tolerated. Exercise B: Leg stretch 1. If you can move your legs, try to "draw" letters on the floor with the toes of your foot. Write your name with one foot. 2. Write your name with the toes of your other foot. Slowly repeat the movements for one minute as tolerated. Exercise C: Reach for the sky 1. Reach your hands as far over your head as you can to stretch your spine. 2. Move your hands and arms as if you are climbing a  rope. Slowly repeat the movements for one minute as tolerated. Range of motion exercises Exercise A: Shoulder roll 1. Let your arms hang loosely at your sides. 2. Lift just your shoulders up toward your ears, then let them relax back down. 3. When your shoulders feel loose, rotate your shoulders in backward and forward circles. Do shoulder rolls slowly for one minute as tolerated. Exercise B: March in place 1. As if you are marching, pump your arms and lift your legs up and down. Lift your knees as high as you can. ? If you are unable to lift your knees, just pump your arms and move your ankles and feet up and down. March in place for one minute as tolerated. Exercise C: Seated jumping jacks 1. Let your arms hang down straight. 2. Keeping your arms straight, lift them up over your head. Aim to point your fingers to the ceiling. 3. While you lift your arms, straighten your legs and slide your heels along the floor to your sides, as wide as you can. 4. As you bring your arms back down to your sides, slide your legs back together. ? If you are unable to use your legs, just move your arms. Slowly repeat seated jumping jacks for one minute as tolerated. Strengthening exercises Exercise A: Shoulder squeeze 1. Hold your arms straight out from your body to your sides, with your elbows bent and your fists pointed at the ceiling. 2. Keeping your arms in the bent position, move them forward so your elbows and forearms meet in front of your face. 3. Open your arms back out as wide as you can with your elbows still bent, until you feel your shoulder blades squeezing together. Hold for 5 seconds. Slowly repeat the movements forward and backward for one minute as tolerated. Contact a health care provider if you:  Had to stop exercising due to any of the following: ? Pain. ? Nausea. ? Shortness of breath. ? Dizziness. ? Fatigue.  Have significant pain or soreness after exercising. Get help right  away if you have:  Chest pain.  Difficulty breathing. These symptoms may represent a serious problem that is an emergency. Do not wait to see if the symptoms will go away. Get medical help right away. Call your local emergency services (911 in the [place] Do not drive yourself to the hospital. This information is not intended to replace advice given to you by your health care provider. Make sure you discuss any questions you have with your health care provider. Document Revised: 09/23/2018 Document Reviewed: 04/15/2017 Elsevier Patient Education  2020 [employer].

## 2020-04-17 DIAGNOSIS — M170 Bilateral primary osteoarthritis of knee: Secondary | ICD-10-CM | POA: Diagnosis not present

## 2020-04-17 DIAGNOSIS — M25562 Pain in left knee: Secondary | ICD-10-CM | POA: Diagnosis not present

## 2020-04-17 DIAGNOSIS — M25561 Pain in right knee: Secondary | ICD-10-CM | POA: Diagnosis not present

## 2020-04-17 DIAGNOSIS — M76891 Other specified enthesopathies of right lower limb, excluding foot: Secondary | ICD-10-CM | POA: Diagnosis not present

## 2020-04-17 DIAGNOSIS — M1712 Unilateral primary osteoarthritis, left knee: Secondary | ICD-10-CM | POA: Diagnosis not present

## 2020-04-17 DIAGNOSIS — G8929 Other chronic pain: Secondary | ICD-10-CM | POA: Diagnosis not present

## 2020-04-17 DIAGNOSIS — M25461 Effusion, right knee: Secondary | ICD-10-CM | POA: Diagnosis not present

## 2020-04-17 DIAGNOSIS — M1711 Unilateral primary osteoarthritis, right knee: Secondary | ICD-10-CM | POA: Diagnosis not present

## 2020-05-04 ENCOUNTER — Ambulatory Visit (INDEPENDENT_AMBULATORY_CARE_PROVIDER_SITE_OTHER): Payer: PPO | Admitting: Family Medicine

## 2020-05-04 ENCOUNTER — Encounter: Payer: Self-pay | Admitting: Family Medicine

## 2020-05-04 ENCOUNTER — Other Ambulatory Visit: Payer: Self-pay

## 2020-05-04 VITALS — BP 116/58 | HR 80 | Temp 97.2°F | Resp 14 | Ht 62.0 in | Wt 142.8 lb

## 2020-05-04 DIAGNOSIS — I493 Ventricular premature depolarization: Secondary | ICD-10-CM | POA: Diagnosis not present

## 2020-05-04 DIAGNOSIS — E1169 Type 2 diabetes mellitus with other specified complication: Secondary | ICD-10-CM | POA: Diagnosis not present

## 2020-05-04 DIAGNOSIS — E785 Hyperlipidemia, unspecified: Secondary | ICD-10-CM | POA: Diagnosis not present

## 2020-05-04 DIAGNOSIS — Z01818 Encounter for other preprocedural examination: Secondary | ICD-10-CM | POA: Diagnosis not present

## 2020-05-04 DIAGNOSIS — I10 Essential (primary) hypertension: Secondary | ICD-10-CM | POA: Diagnosis not present

## 2020-05-04 DIAGNOSIS — E782 Mixed hyperlipidemia: Secondary | ICD-10-CM

## 2020-05-04 DIAGNOSIS — R600 Localized edema: Secondary | ICD-10-CM

## 2020-05-04 DIAGNOSIS — K219 Gastro-esophageal reflux disease without esophagitis: Secondary | ICD-10-CM | POA: Diagnosis not present

## 2020-05-04 DIAGNOSIS — M1711 Unilateral primary osteoarthritis, right knee: Secondary | ICD-10-CM | POA: Diagnosis not present

## 2020-05-04 LAB — POCT URINALYSIS DIP (CLINITEK)
Bilirubin, UA: NEGATIVE
Blood, UA: NEGATIVE
Glucose, UA: NEGATIVE mg/dL
Ketones, POC UA: NEGATIVE mg/dL
Leukocytes, UA: NEGATIVE
Nitrite, UA: NEGATIVE
POC PROTEIN,UA: NEGATIVE
Spec Grav, UA: 1.025 (ref 1.010–1.025)
Urobilinogen, UA: 0.2 E.U./dL
pH, UA: 5 (ref 5.0–8.0)

## 2020-05-04 LAB — POCT UA - MICROALBUMIN: Microalbumin Ur, POC: 30 mg/L

## 2020-05-04 LAB — EKG 12-LEAD

## 2020-05-04 MED ORDER — PANTOPRAZOLE SODIUM 40 MG PO TBEC: 40.0000 mg | DELAYED_RELEASE_TABLET | Freq: Two times a day (BID) | ORAL | 1 refills | Status: DC

## 2020-05-04 NOTE — Patient Instructions (Addendum)
Recommendations: Change calcium carbonate to calcium citrate with vitamin D.  Still recommend 1200 to 1500 mg/day.  Reflux: Discontinue omeprazole.  Start pantoprazole 40 mg twice daily.  Diabetes: Has been well controlled however patient is having significant diarrhea.  I am concerned this may be related to her Metformin.  Recommend discontinuation of her Metformin and start her on Synjardy 12.10/998 mg once daily.

## 2020-05-04 NOTE — Progress Notes (Signed)
Subjective:  Patient ID: Alicia Jordan, female    DOB: 1946/03/13  Age: 74 y.o. MRN: [id number]  Chief Complaint  Patient presents with  . Diabetes  . Hypertension   HPI  Diabetes: Sugars 2-3 times per week. 120-140.  Eating healthy. Unable to exercise right now because of her knee. Checks her feet daily. Gets annual eye visits. Last HBA1C 6.5. Currently on metformin 1000 mg one twice a day.   Hypertension: Taking hyzaar 100/25 mg daily. Bps good.   Hyperlipidemia: taking simvastatin. Low fat diet.  GERD: Omeprazole 40 mg one twice a day. Has breakthrough reflux. Pain in epigastric. Takes mylanta and it helps her. Has to sit up until pain resolves.  Ostearthritis of right knee - here for preop clearance for tkr by Dr. Ronnie Derby.   Depression/anxiety: prozac doing well.   Preoperative cardiovascular clearance for TKR. Patient is seeing Dr. Ronnie Derby.   Current Outpatient Medications on File Prior to Visit  Medication Sig Dispense Refill  . diphenhydrAMINE (BENADRYL) 25 mg capsule Take 25 mg by mouth every 6 (six) hours as needed.    . Acetaminophen (TYLENOL PO) Take by mouth as needed.    . calcium-vitamin D (OSCAL WITH D) 500-200 MG-UNIT tablet Take 1 tablet by mouth.    . Cetirizine HCl (ZYRTEC ALLERGY) 10 MG CAPS Take by mouth daily.    Marland Kitchen dextromethorphan-guaiFENesin (MUCINEX DM) 30-600 MG 12hr tablet Take 1 tablet by mouth 2 (two) times daily. 60 tablet 0  . FLUoxetine (PROZAC) 20 MG tablet Take 3 tablets (60 mg total) by mouth daily. 90 tablet 3  . losartan-hydrochlorothiazide (HYZAAR) 100-25 MG tablet Take 1 tablet by mouth daily. 90 tablet 1  . metFORMIN (GLUCOPHAGE) 1000 MG tablet Take 1 tablet by mouth twice daily 180 tablet 0  . omeprazole (PRILOSEC) 40 MG capsule Take 1 capsule by mouth twice daily 180 capsule 0  . simvastatin (ZOCOR) 40 MG tablet TAKE 1 TABLET BY MOUTH AT BEDTIME 90 tablet 0   Current Facility-Administered Medications on File Prior to Visit  Medication  Dose Route Frequency Provider Last Rate Last Admin  . triamcinolone acetonide (KENALOG-40) injection 80 mg  80 mg Intra-articular Once [name], MD       Past Medical History:  Diagnosis Date  . Allergy   . Anxiety   . Arthralgia of left temporomandibular joint   . Arthritis   . Cervicalgia   . Chronic kidney disease, stage 3a   . Depression   . Diabetes mellitus without complication ([place])   . Hesitancy of micturition   . Hyperlipidemia   . Hypertension   . Osteoporosis   . Other psoriasis   . Other specified menopausal and perimenopausal disorders   . Overweight   . Repeated falls   . Residual hemorrhoidal skin tags    Past Surgical History:  Procedure Laterality Date  . ABDOMINAL HYSTERECTOMY    . APPENDECTOMY    . CARPAL TUNNEL RELEASE Right   . CHOLECYSTECTOMY    . COLONOSCOPY    . eye lid surgery    . KNEE SURGERY Left     Family History  Problem Relation Age of Onset  . Colon cancer Daughter   . Diabetes type II Mother   . Hyperlipidemia Mother   . Hypertension Mother   . Heart attack Father   . Hyperlipidemia Father   . Hypertension Father   . Stroke Father   . Diabetes type II Father   . Esophageal cancer Neg Hx   .  Rectal cancer Neg Hx   . Stomach cancer Neg Hx    Social History   Socioeconomic History  . Marital status: Married    Spouse name: Not on file  . Number of children: 4  . Years of education: Not on file  . Highest education level: Not on file  Occupational History  . Occupation: Homemaker  Tobacco Use  . Smoking status: Never Smoker  . Smokeless tobacco: Never Used  Vaping Use  . Vaping Use: Never used  Substance and Sexual Activity  . Alcohol use: Never  . Drug use: Never  . Sexual activity: Not on file  Other Topics Concern  . Not on file  Social History Narrative  . Not on file   Social Determinants of Health   Financial Resource Strain:   . Difficulty of Paying Living Expenses: Not on file  Food Insecurity: No  Food Insecurity  . Worried About Charity fundraiser in the Last Year: Never true  . Ran Out of Food in the Last Year: Never true  Transportation Needs:   . Lack of Transportation (Medical): Not on file  . Lack of Transportation (Non-Medical): Not on file  Physical Activity:   . Days of Exercise per Week: Not on file  . Minutes of Exercise per Session: Not on file  Stress:   . Feeling of Stress : Not on file  Social Connections:   . Frequency of Communication with Friends and Family: Not on file  . Frequency of Social Gatherings with Friends and Family: Not on file  . Attends Religious Services: Not on file  . Active Member of Clubs or Organizations: Not on file  . Attends Archivist Meetings: Not on file  . Marital Status: Not on file    Review of Systems  Constitutional: Negative for chills, fatigue and fever.  HENT: Positive for rhinorrhea (allergies). Negative for congestion and sore throat.   Respiratory: Negative for cough and shortness of breath.   Cardiovascular: Negative for chest pain, palpitations and leg swelling.  Gastrointestinal: Positive for diarrhea (chronic. 2-3 times per day, but can be up to 7-8 times per day.  Immodium helps. ). Negative for abdominal pain, constipation, nausea and vomiting.  Genitourinary: Negative for dysuria and urgency.  Musculoskeletal: Positive for arthralgias (right knee pain). Negative for back pain and myalgias.  Neurological: Negative for dizziness, weakness, light-headedness and headaches.  Psychiatric/Behavioral: Negative for dysphoric mood. The patient is not nervous/anxious.      Objective:  BP (!) 116/58   Pulse 80   Temp (!) 97.2 F (36.2 C)   Resp 14   Ht 5\' 2"  (1.575 m)   Wt 142 lb 12.8 oz (64.8 kg)   BMI 26.12 kg/m   BP/Weight 05/04/2020 8/67/6195 0/02/3266  Systolic BP 124 580 998  Diastolic BP 58 68 60  Wt. (Lbs) 142.8 143 149.6  BMI 26.12 26.16 27.36    Physical Exam Vitals reviewed.    Constitutional:      Appearance: Normal appearance. She is normal weight.  Neck:     Vascular: No carotid bruit.  Cardiovascular:     Rate and Rhythm: Normal rate and regular rhythm.     Pulses: Normal pulses.     Heart sounds: Normal heart sounds.  Pulmonary:     Effort: Pulmonary effort is normal. No respiratory distress.     Breath sounds: Normal breath sounds.  Abdominal:     General: Abdomen is flat. Bowel sounds are normal.  Palpations: Abdomen is soft.     Tenderness: There is no abdominal tenderness.  Neurological:     Mental Status: She is alert and oriented to person, place, and time.  Psychiatric:        Mood and Affect: Mood normal.        Behavior: Behavior normal.     Diabetic Foot Exam - Simple   Simple Foot Form Diabetic Foot exam was performed with the following findings: Yes 05/04/2020  9:16 AM  Visual Inspection No deformities, no ulcerations, no other skin breakdown bilaterally: Yes Sensation Testing Intact to touch and monofilament testing bilaterally: Yes Pulse Check Posterior Tibialis and Dorsalis pulse intact bilaterally: Yes Comments      Lab Results  Component Value Date   WBC 6.0 11/09/2019   HGB 10.9 (L) 11/09/2019   HCT 35.2 11/09/2019   PLT 174 11/09/2019   GLUCOSE 125 (H) 11/09/2019   CHOL 158 11/09/2019   TRIG 167 (H) 11/09/2019   HDL 47 11/09/2019   LDLCALC 82 11/09/2019   ALT 9 11/09/2019   AST 13 11/09/2019   NA 143 11/09/2019   K 5.2 11/09/2019   CL 104 11/09/2019   CREATININE 1.16 (H) 11/09/2019   BUN 19 11/09/2019   CO2 21 11/09/2019   TSH 3.170 08/11/2019   HGBA1C 6.5 (H) 11/09/2019   MICROALBUR 30 05/04/2020      Assessment & Plan:   1. Essential hypertension Well controlled. The current medical regimen is effective;  continue present plan and medications. Continue to eat healthy. - TSH - DG Chest 2 View  2. Dyslipidemia associated with type 2 diabetes mellitus ([place]) Has been well controlled however  patient is having significant diarrhea.  I am concerned this may be related to her Metformin.  Recommend discontinuation of her Metformin and start her on Synjardy 12.10/998 mg once daily.  - Hemoglobin A1c - POCT UA - Microalbumin  3. GERD without esophagitis Discontinue omeprazole.  Start pantoprazole 40 mg twice daily.  4. Pedal edema Resolved with addition of hctz..   5. Primary osteoarthritis of right knee Needs TKR. Here for preop clearance.  6. Preoperative clearance - CBC with Differential/Platelet - Comprehensive metabolic panel - Protime-INR - POCT URINALYSIS DIP (CLINITEK) - EKG 12-Lead - DG Chest 2 View - MRSA culture  7. Mixed hyperlipidemia Well controlled.  No changes to medicines.  Continue to work on eating a healthy diet and exercise.  Labs drawn today.  - Lipid panel  8. PVC's (premature ventricular contractions)  New found on EKG. Recommend echocardiogram.  Meds ordered this encounter  Medications  . pantoprazole (PROTONIX) 40 MG tablet    Sig: Take 1 tablet (40 mg total) by mouth 2 (two) times daily.    Dispense:  180 tablet    Refill:  1    Orders Placed This Encounter  Procedures  . MRSA culture  . DG Chest 2 View  . CBC with Differential/Platelet  . Comprehensive metabolic panel  . Hemoglobin A1c  . Lipid panel  . TSH  . Protime-INR  . POCT UA - Microalbumin  . POCT URINALYSIS DIP (CLINITEK)  . EKG 12-Lead     I spent 30 minutes evaluating patient's chronic medical issues, reviewing old medical records for cardiac work-ups, making medications changes, examining the patient and making a plan.  EKG abnormalities frequent unifocal PVCs.  Recommend echocardiogram.  Numerous labs taken as well as chest x-ray ordered for preoperative clearance.  Will evaluate patient's risk following review of  these results.   Follow-up: Return in about 3 months (around 08/04/2020) for fasting.  An After Visit Summary was printed and given to the  patient.  [name], MD [facility] [phone]

## 2020-05-05 LAB — HEMOGLOBIN A1C
Est. average glucose Bld gHb Est-mCnc: 160 mg/dL
Hgb A1c MFr Bld: 7.2 % — ABNORMAL HIGH (ref 4.8–5.6)

## 2020-05-05 LAB — CBC WITH DIFFERENTIAL/PLATELET
Basophils Absolute: 0 10*3/uL (ref 0.0–0.2)
Basos: 1 %
EOS (ABSOLUTE): 0.1 10*3/uL (ref 0.0–0.4)
Eos: 1 %
Hematocrit: 35.7 % (ref 34.0–46.6)
Hemoglobin: 11.4 g/dL (ref 11.1–15.9)
Immature Grans (Abs): 0 10*3/uL (ref 0.0–0.1)
Immature Granulocytes: 0 %
Lymphocytes Absolute: 1.8 10*3/uL (ref 0.7–3.1)
Lymphs: 27 %
MCH: 27.3 pg (ref 26.6–33.0)
MCHC: 31.9 g/dL (ref 31.5–35.7)
MCV: 85 fL (ref 79–97)
Monocytes Absolute: 0.4 10*3/uL (ref 0.1–0.9)
Monocytes: 6 %
Neutrophils Absolute: 4.4 10*3/uL (ref 1.4–7.0)
Neutrophils: 65 %
Platelets: 215 10*3/uL (ref 150–450)
RBC: 4.18 x10E6/uL (ref 3.77–5.28)
RDW: 14.6 % (ref 11.7–15.4)
WBC: 6.8 10*3/uL (ref 3.4–10.8)

## 2020-05-05 LAB — COMPREHENSIVE METABOLIC PANEL
ALT: 9 IU/L (ref 0–32)
AST: 10 IU/L (ref 0–40)
Albumin/Globulin Ratio: 2 (ref 1.2–2.2)
Albumin: 4.7 g/dL (ref 3.7–4.7)
Alkaline Phosphatase: 58 IU/L (ref 44–121)
BUN/Creatinine Ratio: 22 (ref 12–28)
BUN: 28 mg/dL — ABNORMAL HIGH (ref 8–27)
Bilirubin Total: 0.8 mg/dL (ref 0.0–1.2)
CO2: 23 mmol/L (ref 20–29)
Calcium: 10.4 mg/dL — ABNORMAL HIGH (ref 8.7–10.3)
Chloride: 101 mmol/L (ref 96–106)
Creatinine, Ser: 1.3 mg/dL — ABNORMAL HIGH (ref 0.57–1.00)
GFR calc Af Amer: 47 mL/min/{1.73_m2} — ABNORMAL LOW (ref >59)
GFR calc non Af Amer: 41 mL/min/{1.73_m2} — ABNORMAL LOW (ref >59)
Globulin, Total: 2.4 g/dL (ref 1.5–4.5)
Glucose: 146 mg/dL — ABNORMAL HIGH (ref 65–99)
Potassium: 4.5 mmol/L (ref 3.5–5.2)
Sodium: 141 mmol/L (ref 134–144)
Total Protein: 7.1 g/dL (ref 6.0–8.5)

## 2020-05-05 LAB — TSH: TSH: 3.59 u[IU]/mL (ref 0.450–4.500)

## 2020-05-05 LAB — LIPID PANEL
Chol/HDL Ratio: 3.1 ratio (ref 0.0–4.4)
Cholesterol, Total: 159 mg/dL (ref 100–199)
HDL: 52 mg/dL (ref >39)
LDL Chol Calc (NIH): 80 mg/dL (ref 0–99)
Triglycerides: 157 mg/dL — ABNORMAL HIGH (ref 0–149)
VLDL Cholesterol Cal: 27 mg/dL (ref 5–40)

## 2020-05-05 LAB — PROTIME-INR
INR: 1 (ref 0.9–1.2)
Prothrombin Time: 10.3 s (ref 9.1–12.0)

## 2020-05-05 LAB — CARDIOVASCULAR RISK ASSESSMENT

## 2020-05-07 DIAGNOSIS — Z01818 Encounter for other preprocedural examination: Secondary | ICD-10-CM | POA: Diagnosis not present

## 2020-05-07 LAB — MRSA CULTURE: MRSA Screen: NEGATIVE

## 2020-05-07 NOTE — Chronic Care Management (AMB) (Deleted)
Chronic Care Management Pharmacy  Name: Alicia Jordan  MRN: [id number] DOB: November 20, 1945  Chief Complaint/ HPI  Alicia Jordan,  74 y.o. , female presents for their Follow-Up CCM visit with the clinical pharmacist via telephone due to COVID-19 Pandemic.  PCP : [name], MD  Their chronic conditions include: hypertension, GERD, dyslipidemia, type 2 diabetes, hyperlipidemia, pedal edema.   Office Visits: 05/04/2020 - Patient starting Synjardy 12.10/998 mg daily due to diarrhea with metformin. Change omeprazole to pantoprazole bid. Change Calcium carbonate to calcium citrate with D.  Consult Visit: 04/17/2020 - patient needs right total knee replacement.   Medications: Outpatient Encounter Medications as of 05/08/2020  Medication Sig  . Acetaminophen (TYLENOL PO) Take by mouth as needed.  . calcium-vitamin D (OSCAL WITH D) 500-200 MG-UNIT tablet Take 1 tablet by mouth.  . Cetirizine HCl (ZYRTEC ALLERGY) 10 MG CAPS Take by mouth daily.  . diphenhydrAMINE (BENADRYL) 25 mg capsule Take 25 mg by mouth every 6 (six) hours as needed.  Marland Kitchen FLUCELVAX QUADRIVALENT SUSP injection   . FLUoxetine (PROZAC) 20 MG tablet Take 3 tablets (60 mg total) by mouth daily.  Marland Kitchen losartan-hydrochlorothiazide (HYZAAR) 100-25 MG tablet Take 1 tablet by mouth daily.  . pantoprazole (PROTONIX) 40 MG tablet Take 1 tablet (40 mg total) by mouth 2 (two) times daily.  . simvastatin (ZOCOR) 40 MG tablet TAKE 1 TABLET BY MOUTH AT BEDTIME   Facility-Administered Encounter Medications as of 05/08/2020  Medication  . triamcinolone acetonide (KENALOG-40) injection 80 mg   Allergies  Allergen Reactions  . Meloxicam    SDOH Screenings   Alcohol Screen:   . Last Alcohol Screening Score (AUDIT): Not on file  Depression (PHQ2-9): Low Risk   . PHQ-2 Score: 0  Financial Resource Strain:   . Difficulty of Paying Living Expenses: Not on file  Food Insecurity: No Food Insecurity  . Worried About Sales executive in the Last Year: Never true  . Ran Out of Food in the Last Year: Never true  Housing: Low Risk   . Last Housing Risk Score: 0  Physical Activity:   . Days of Exercise per Week: Not on file  . Minutes of Exercise per Session: Not on file  Social Connections:   . Frequency of Communication with Friends and Family: Not on file  . Frequency of Social Gatherings with Friends and Family: Not on file  . Attends Religious Services: Not on file  . Active Member of Clubs or Organizations: Not on file  . Attends Archivist Meetings: Not on file  . Marital Status: Not on file  Stress:   . Feeling of Stress : Not on file  Tobacco Use: Low Risk   . Smoking Tobacco Use: Never Smoker  . Smokeless Tobacco Use: Never Used  Transportation Needs:   . Film/video editor (Medical): Not on file  . Lack of Transportation (Non-Medical): Not on file     Current Diagnosis/Assessment:  Goals Addressed   None     Diabetes   Recent Relevant Labs: Lab Results  Component Value Date/Time   HGBA1C 7.2 (H) 05/04/2020 09:35 AM   HGBA1C 6.5 (H) 11/09/2019 09:53 AM   MICROALBUR 30 05/04/2020 09:13 AM   MICROALBUR 80 08/11/2019 09:50 AM     Checking BG: Daily  Recent FBG Readings:140-150 mg/dL  Patient is currently controlled on the following medications:   Synjardy 12.10/998 mg daily   Last diabetic Foot exam:  Lab Results  Component Value  Date/Time   HMDIABEYEEXA No Retinopathy 01/26/2019 12:00 AM    Last diabetic Eye exam: No results found for: HMDIABFOOTEX   We discussed: diet and exercise extensively   Diet: Patient controls food portions.   Exercise: Patient is unable to exercise at this time due to knee pain. She sees ortho for evaluation 04/17/2020.   Patient denies the need for help affording Tradjenta at this time. Will reevaluate after next lab results. Patient had 2 steroid injections in her knee that drove her blood sugar up. Short-term Tradjenta was well  tolerated and improved blood sugar.   Plan  Continue current medications and  Hypertension   BP today is:  <130/80  Office blood pressures are  BP Readings from Last 3 Encounters:  05/04/20 (!) 116/58  03/15/20 124/68  01/20/20 (!) 144/60    Patient has failed these meds in the past: none reported Patient is currently controlled on the following medications:   Hydrochlorothiazide 25 mg daily  Losartan 100 mg daily  Patient checks BP at home when feeling symptomatic  Patient home BP readings are ranging: uncertain  We discussed diet and exercise extensively   Patient is interested in combination therapy if Dr. Tobie Poet approves Losartan-HCTZ 100-25 mg daily.   Plan  Continue current medications. Begin taking Losartan 100-25 mg daily once approved.    Hyperlipidemia   LDL goal < 100 mg/dL  Lipid Panel     Component Value Date/Time   CHOL 159 05/04/2020 0935   TRIG 157 (H) 05/04/2020 0935   HDL 52 05/04/2020 0935   LDLCALC 80 05/04/2020 0935    Hepatic Function Latest Ref Rng & Units 05/04/2020 11/09/2019 08/11/2019  Total Protein 6.0 - 8.5 g/dL 7.1 6.9 7.0  Albumin 3.7 - 4.7 g/dL 4.7 4.7 4.8(H)  AST 0 - 40 IU/L _0 ALT 0 - 32 IU/L 9 9 -  Alk Phosphatase 44 - 121 IU/L 58 63 65  Total Bilirubin 0.0 - 1.2 mg/dL 0.8 0.6 0.8     The 10-year ASCVD risk score Mikey Bussing DC Jr., et al., 2013) is: 22.6%   Values used to calculate the score:     Age: 76 years     Sex: Female     Is Non-Hispanic African American: No     Diabetic: Yes     Tobacco smoker: No     Systolic Blood Pressure: 845 mmHg     Is BP treated: Yes     HDL Cholesterol: 52 mg/dL     Total Cholesterol: 159 mg/dL   Patient has failed these meds in past: Questran, vascepa Patient is currently controlled on the following medications:  . Simvastatin 40 mg daily  We discussed:  diet and exercise extensively. Consider generic Lovaza after next month's blood work. Patient's plan covers Vascepa as a tier 4  which is too expensive for patient.    Plan  Continue current medications  Anxiety and Depression   Patient has failed these meds in past: none reported Patient is currently uncontrolled on the following medications:  . Fluoxetine 40 mg daily  We discussed:  Patient is helping her daughter through cancer treatment and getting to appointments at [facility]. Patient states that the cancer is incurable. She indicates that this is quite stressful for her. She reports not wanting to participate in normal activities at times. She feels like she needs an increase in dose or alternative medication. We discussed buspirone as an option for as needed anxiety. Patient states as  her daughter's disease progresses she will likely need more pharmacotherapy.   Plan  Consider alternate therapy.    GERD   Patient has failed these meds in past: omeprazole  Patient is currently controlled on the following medications:  . Pantoprazole 40 mg bid   We discussed:  Patient reports that symptoms are well controlled with current therapy. She indicates a strong family history of stomach issues. She has breakthrough with less dosing.   Plan  Continue current medications   Osteopenia     No results found for: VD25OH   Patient has failed these meds in past: none reported Patient is currently uncontrolled on the following medications:  . Calcium citrate-vitamin D daily ***  We discussed:  Recommend [phone] units of vitamin D daily. Recommend 1200 mg of calcium daily from dietary and supplemental sources. Recommend weight-bearing and muscle strengthening exercises for building and maintaining bone density.   Patient reports that her dexa scan and mammogram were not completed earlier this year when ordered. Pharmacist reached out to referral coordinator to follow-up.   Plan  Continue current medications   Health Maintenance   Patient is currently controlled on the following medications:   . Acetaminophen 1000 mg bid prn - shoulder pain . Cetirzine 10 mg daily prn - allergy   We discussed:  Patient reports her knee pain is worsened. She will follow-up with ortho 04/17/2020. She takes tylenol as needed for arthritis knee pain.  Plan  Continue current medications  Vaccines   Reviewed and discussed patient's vaccination history.    Immunization History  Administered Date(s) Administered  . Fluad Quad(high Dose 65+) 02/08/2019  . Influenza-Unspecified 02/27/2020  . Moderna SARS-COVID-2 Vaccination 10/31/2019, 11/28/2019  . Pneumococcal Conjugate-13 05/13/2014  . Pneumococcal Polysaccharide-23 01/29/2017  . Tdap 05/03/2012  . Zoster 05/16/2010  . Zoster Recombinat (Shingrix) 06/11/2017    Plan  Recommended patient receive third COVID vaccine in office. .   Medication Management   Pt uses [facility] for all medications Uses pill box? Yes Pt fill history is 100% for simvastatin and metformin. 50-59% on losartan per adherence data.   We discussed: Discussed benefits of medication synchronization, packaging and delivery as well as enhanced pharmacist oversight with [employer]. Patient denies the need for coordination of pharmacy delivery at this time. Enjoys getting out of the house and going to [place].   Plan  Continue current medication management strategy    Follow up: 1 month phone visit

## 2020-05-08 ENCOUNTER — Telehealth: Payer: PPO

## 2020-05-09 ENCOUNTER — Other Ambulatory Visit: Payer: Self-pay

## 2020-05-09 ENCOUNTER — Ambulatory Visit: Payer: PPO

## 2020-05-09 DIAGNOSIS — E1169 Type 2 diabetes mellitus with other specified complication: Secondary | ICD-10-CM

## 2020-05-09 DIAGNOSIS — M5442 Lumbago with sciatica, left side: Secondary | ICD-10-CM | POA: Diagnosis not present

## 2020-05-09 DIAGNOSIS — E782 Mixed hyperlipidemia: Secondary | ICD-10-CM

## 2020-05-09 DIAGNOSIS — M9903 Segmental and somatic dysfunction of lumbar region: Secondary | ICD-10-CM | POA: Diagnosis not present

## 2020-05-09 DIAGNOSIS — M9904 Segmental and somatic dysfunction of sacral region: Secondary | ICD-10-CM | POA: Diagnosis not present

## 2020-05-09 DIAGNOSIS — M5136 Other intervertebral disc degeneration, lumbar region: Secondary | ICD-10-CM | POA: Diagnosis not present

## 2020-05-09 MED ORDER — DAPAGLIFLOZIN PROPANEDIOL 5 MG PO TABS: 5.0000 mg | ORAL_TABLET | Freq: Every day | ORAL | 2 refills | Status: DC

## 2020-05-09 NOTE — Chronic Care Management (AMB) (Signed)
Chronic Care Management Pharmacy  Name: Alicia Jordan  MRN: [id number] DOB: 1946-03-04  Chief Complaint/ HPI  Alicia Jordan,  74 y.o. , female presents for their Follow-Up CCM visit with the clinical pharmacist via telephone due to COVID-19 Pandemic.  PCP : [name], MD  Their chronic conditions include: hypertension, GERD, dyslipidemia, type 2 diabetes, hyperlipidemia, pedal edema.   Plan Recommendations:   Patient states that she is tolerating Synjardy and stool is not as loose. Wilder Glade was recommended after last blood work but patient has not picked up. Patient will need help with either patient assistance for SGLT2 option. Farxiga patient assistance is quicker approval. If continuing combination with metformin, we could consider Xigduo. Please advise.  Please send fluoxetine 20 mg capsules prescription to [facility] for cost savings. Tablets are non-preferred and expensive for patient.   Office Visits: 05/04/2020 - Patient stopping Synjardy 12.10/998 mg daily due to diarrhea with metformin. Farxiga 5 mg daily ordered. Change omeprazole to pantoprazole bid. Change Calcium carbonate to calcium citrate with D.  Consult Visit: 04/17/2020 - patient needs right total knee replacement.   Medications: Outpatient Encounter Medications as of 05/09/2020  Medication Sig  . Acetaminophen (TYLENOL PO) Take by mouth as needed.  . calcium-vitamin D (OSCAL WITH D) 500-200 MG-UNIT tablet Take 1 tablet by mouth.  . Cetirizine HCl (ZYRTEC ALLERGY) 10 MG CAPS Take by mouth daily.  . dapagliflozin propanediol (FARXIGA) 5 MG TABS tablet Take 1 tablet (5 mg total) by mouth daily before breakfast.  . diphenhydrAMINE (BENADRYL) 25 mg capsule Take 25 mg by mouth every 6 (six) hours as needed.  Marland Kitchen FLUCELVAX QUADRIVALENT SUSP injection   . FLUoxetine (PROZAC) 20 MG tablet Take 3 tablets (60 mg total) by mouth daily.  Marland Kitchen losartan-hydrochlorothiazide (HYZAAR) 100-25 MG tablet Take 1 tablet by mouth  daily.  . pantoprazole (PROTONIX) 40 MG tablet Take 1 tablet (40 mg total) by mouth 2 (two) times daily.  . simvastatin (ZOCOR) 40 MG tablet TAKE 1 TABLET BY MOUTH AT BEDTIME   Facility-Administered Encounter Medications as of 05/09/2020  Medication  . triamcinolone acetonide (KENALOG-40) injection 80 mg   Allergies  Allergen Reactions  . Meloxicam    SDOH Screenings   Alcohol Screen:   . Last Alcohol Screening Score (AUDIT): Not on file  Depression (PHQ2-9): Low Risk   . PHQ-2 Score: 0  Financial Resource Strain:   . Difficulty of Paying Living Expenses: Not on file  Food Insecurity: No Food Insecurity  . Worried About Charity fundraiser in the Last Year: Never true  . Ran Out of Food in the Last Year: Never true  Housing: Low Risk   . Last Housing Risk Score: 0  Physical Activity:   . Days of Exercise per Week: Not on file  . Minutes of Exercise per Session: Not on file  Social Connections:   . Frequency of Communication with Friends and Family: Not on file  . Frequency of Social Gatherings with Friends and Family: Not on file  . Attends Religious Services: Not on file  . Active Member of Clubs or Organizations: Not on file  . Attends Archivist Meetings: Not on file  . Marital Status: Not on file  Stress:   . Feeling of Stress : Not on file  Tobacco Use: Low Risk   . Smoking Tobacco Use: Never Smoker  . Smokeless Tobacco Use: Never Used  Transportation Needs:   . Film/video editor (Medical): Not on file  .  Lack of Transportation (Non-Medical): Not on file    Current Diagnosis/Assessment:  Goals Addressed            This Visit's Progress   . Pharmacy Care Plan       CARE PLAN ENTRY (see longitudinal plan of care for additional care plan information)  Current Barriers:  . Chronic Disease Management support, education, and care coordination needs related to Hypertension, Hyperlipidemia, Diabetes, and Depression   Hypertension BP Readings  from Last 3 Encounters:  05/04/20 (!) 116/58  03/15/20 124/68  01/20/20 (!) 144/60   . Pharmacist Clinical Goal(s): o Over the next 90 days, patient will work with PharmD and providers to maintain BP goal <130/80 . Current regimen:  o Losartan-hydrochlorothiazide 100-25 mg daily  . Interventions: o Discussed medications and blood pressure control.  . Patient self care activities - Over the next 90 days, patient will: o Check BP as needed, document, and provide at future appointments o Ensure daily salt intake < 2300 mg/day  Hyperlipidemia Lab Results  Component Value Date/Time   LDLCALC 80 05/04/2020 09:35 AM   . Pharmacist Clinical Goal(s): o Over the next 90 days, patient will work with PharmD and providers to maintain LDL goal < 100 . Current regimen:  o Simvastatin 40 mg daily  . Interventions: o Medication list reviewed.  o Reviewed generic Lovaza coverage as a more affordable option for patient vs. Vascepa. Patient prefers to wait to begin anything new until after updated lab results next month.  . Patient self care activities - Over the next 90 days, patient will: o Continue taking medications as prescribed.   Diabetes Lab Results  Component Value Date/Time   HGBA1C 7.2 (H) 05/04/2020 09:35 AM   HGBA1C 6.5 (H) 11/09/2019 09:53 AM   . Pharmacist Clinical Goal(s): o Over the next 90 days, patient will work with PharmD and providers to achieve A1c goal <7% . Current regimen:  o Synjardy  . Interventions: o Reviewed home blood sugar readings.  o Patient using Synjardy samples but Farxiga recommended and called in to pharmacy. Determining treatment and patient assistance options.   . Patient self care activities - Over the next 90 days, patient will: o Check blood sugar once daily, document, and provide at future appointments o Contact provider with any episodes of hypoglycemia  Depression . Pharmacist Clinical Goal(s) o Over the next 90 days, patient will work with  PharmD and providers to manage symptoms of depression . Current regimen:  o Fluoxetine 60 mg daily  . Interventions: o Reviewed patient's current medication and symptoms.  o Discussed the cost savings of fluoxetine capsules vs tablets. Requesting script sent in.  . Patient self care activities - Over the next 90 days, patient will: o Continue to take medication as prescribed.   Medication management . Pharmacist Clinical Goal(s): o Over the next 90 days, patient will work with PharmD and providers to maintain optimal medication adherence . Current pharmacy: [facility] . Interventions o Comprehensive medication review performed. o Continue current medication management strategy . Patient self care activities - Over the next 90 days, patient will: o Focus on medication adherence by pill box o Take medications as prescribed o Report any questions or concerns to PharmD and/or provider(s)  Please see past updates related to this goal by clicking on the "Past Updates" button in the selected goal         Diabetes   Recent Relevant Labs: Lab Results  Component Value Date/Time   HGBA1C  7.2 (H) 05/04/2020 09:35 AM   HGBA1C 6.5 (H) 11/09/2019 09:53 AM   MICROALBUR 30 05/04/2020 09:13 AM   MICROALBUR 80 08/11/2019 09:50 AM     Checking BG: Daily  Recent FBG Readings:140-150 mg/dL  Patient is currently uncontrolled on the following medications:   Synjardy 12.10/998 mg daily    Last diabetic Foot exam:  Lab Results  Component Value Date/Time   HMDIABEYEEXA No Retinopathy 01/26/2019 12:00 AM    Last diabetic Eye exam: No results found for: HMDIABFOOTEX   We discussed: Synjardy   Diet: Patient controls food portions.   Exercise: Patient is unable to exercise at this time due to knee pain. Patient trying to schedule knee replacement for January. Patient reports Farxiga 5 mg daily replaced Synjardy due to diarrhea. Patient has not picked up medication yet. Pharmacist  encouraged patient to find out cost and let pharmacist know if patient assistance is needed. Pharmacist can coordinate application.   Patient reports taking Synjardy 12.10/998 mg daily samples currently. Dr. Tobie Poet recommended Wilder Glade 5 mg daily with recent labs. Getting clarification for which is appropriate dose and will help patient coordinate patient assistance options. Farxiga patient assistance is much simpler and quicker option to supply medication.   Patient reports that diarrhea is not as loose with Synjardy.   Plan  Continue current medications and  Hypertension   BP today is:  <130/80  Office blood pressures are  BP Readings from Last 3 Encounters:  05/04/20 (!) 116/58  03/15/20 124/68  01/20/20 (!) 144/60    Patient has failed these meds in the past: none reported Patient is currently controlled on the following medications:   Losartan-hydrochlorothiazide 100 mg-25 mg daily  Patient checks BP at home when feeling symptomatic  Patient home BP readings are ranging: uncertain  We discussed diet and exercise extensively Patient's blood pressure remains at or below goal. Patient now has combination tablet ordered.   Plan  Continue current medications.    Hyperlipidemia   LDL goal < 100 mg/dL  Lipid Panel     Component Value Date/Time   CHOL 159 05/04/2020 0935   TRIG 157 (H) 05/04/2020 0935   HDL 52 05/04/2020 0935   LDLCALC 80 05/04/2020 0935    Hepatic Function Latest Ref Rng & Units 05/04/2020 11/09/2019 08/11/2019  Total Protein 6.0 - 8.5 g/dL 7.1 6.9 7.0  Albumin 3.7 - 4.7 g/dL 4.7 4.7 4.8(H)  AST 0 - 40 IU/L $Remov'10 13 13  'tDnjso$ ALT 0 - 32 IU/L 9 9 -  Alk Phosphatase 44 - 121 IU/L 58 63 65  Total Bilirubin 0.0 - 1.2 mg/dL 0.8 0.6 0.8     The 10-year ASCVD risk score Mikey Bussing DC Jr., et al., 2013) is: 22.6%   Values used to calculate the score:     Age: 12 years     Sex: Female     Is Non-Hispanic African American: No     Diabetic: Yes     Tobacco smoker: No      Systolic Blood Pressure: 161 mmHg     Is BP treated: Yes     HDL Cholesterol: 52 mg/dL     Total Cholesterol: 159 mg/dL   Patient has failed these meds in past: Questran, vascepa Patient is currently controlled on the following medications:  . Simvastatin 40 mg daily  We discussed:  diet and exercise extensively. Patient's triglycerides slightly elevated on most recent blood work. Will continue to monitor with improved sugar control and increased exercise with upcoming  knee replacement.    Plan  Continue current medications  Anxiety and Depression   Patient has failed these meds in past: none reported Patient is currently uncontrolled on the following medications:  . Fluoxetine 60 mg daily  We discussed: Patient has not started increased dose yet. She did pick up prescription but was $90 for 30 days. Pharmacist reviewed formulary and this is due to tablets vs. Capsule. Will request capsule prescription from Dr. Tobie Poet before next fill.   Plan  Recommend changing fluoxetine 20 mg to capsule form for cheaper cost.   Vaccines   Reviewed and discussed patient's vaccination history.    Immunization History  Administered Date(s) Administered  . Fluad Quad(high Dose 65+) 02/08/2019  . Influenza-Unspecified 02/27/2020  . Moderna SARS-COVID-2 Vaccination 10/31/2019, 11/28/2019  . Pneumococcal Conjugate-13 05/13/2014  . Pneumococcal Polysaccharide-23 01/29/2017  . Tdap 05/03/2012  . Zoster 05/16/2010  . Zoster Recombinat (Shingrix) 06/11/2017    Plan  Recommended patient receive third COVID vaccine in office. .   Medication Management   Pt uses [facility] for all medications Uses pill box? Yes Pt fill history is 100% for simvastatin and metformin. 50-59% on losartan per adherence data.   We discussed: Discussed benefits of medication synchronization, packaging and delivery as well as enhanced pharmacist oversight with [employer]. Patient denies the need for coordination  of pharmacy delivery at this time. Enjoys getting out of the house and going to [place].   Plan  Continue current medication management strategy    Follow up: 1 month phone visit

## 2020-05-15 NOTE — Patient Instructions (Addendum)
Visit Information  Goals Addressed            This Visit's Progress   . Pharmacy Care Plan       CARE PLAN ENTRY (see longitudinal plan of care for additional care plan information)  Current Barriers:  . Chronic Disease Management support, education, and care coordination needs related to Hypertension, Hyperlipidemia, Diabetes, and Depression   Hypertension BP Readings from Last 3 Encounters:  05/04/20 (!) 116/58  03/15/20 124/68  01/20/20 (!) 144/60   . Pharmacist Clinical Goal(s): o Over the next 90 days, patient will work with PharmD and providers to maintain BP goal <130/80 . Current regimen:  o Losartan-hydrochlorothiazide 100-25 mg daily  . Interventions: o Discussed medications and blood pressure control.  . Patient self care activities - Over the next 90 days, patient will: o Check BP as needed, document, and provide at future appointments o Ensure daily salt intake < 2300 mg/day  Hyperlipidemia Lab Results  Component Value Date/Time   LDLCALC 80 05/04/2020 09:35 AM   . Pharmacist Clinical Goal(s): o Over the next 90 days, patient will work with PharmD and providers to maintain LDL goal < 100 . Current regimen:  o Simvastatin 40 mg daily  . Interventions: o Medication list reviewed.  o Reviewed generic Lovaza coverage as a more affordable option for patient vs. Vascepa. Patient prefers to wait to begin anything new until after updated lab results next month.  . Patient self care activities - Over the next 90 days, patient will: o Continue taking medications as prescribed.   Diabetes Lab Results  Component Value Date/Time   HGBA1C 7.2 (H) 05/04/2020 09:35 AM   HGBA1C 6.5 (H) 11/09/2019 09:53 AM   . Pharmacist Clinical Goal(s): o Over the next 90 days, patient will work with PharmD and providers to achieve A1c goal <7% . Current regimen:  o Synjardy  . Interventions: o Reviewed home blood sugar readings.  o Patient using Synjardy samples but Farxiga  recommended and called in to pharmacy. Determining treatment and patient assistance options.   . Patient self care activities - Over the next 90 days, patient will: o Check blood sugar once daily, document, and provide at future appointments o Contact provider with any episodes of hypoglycemia  Depression . Pharmacist Clinical Goal(s) o Over the next 90 days, patient will work with PharmD and providers to manage symptoms of depression . Current regimen:  o Fluoxetine 60 mg daily  . Interventions: o Reviewed patient's current medication and symptoms.  o Discussed the cost savings of fluoxetine capsules vs tablets. Requesting script sent in.  . Patient self care activities - Over the next 90 days, patient will: o Continue to take medication as prescribed.   Medication management . Pharmacist Clinical Goal(s): o Over the next 90 days, patient will work with PharmD and providers to maintain optimal medication adherence . Current pharmacy: [facility] . Interventions o Comprehensive medication review performed. o Continue current medication management strategy . Patient self care activities - Over the next 90 days, patient will: o Focus on medication adherence by pill box o Take medications as prescribed o Report any questions or concerns to PharmD and/or provider(s)  Please see past updates related to this goal by clicking on the "Past Updates" button in the selected goal         The patient verbalized understanding of instructions, educational materials, and care plan provided today and declined offer to receive copy of patient instructions, educational materials, and care  plan.   Telephone follow up appointment with pharmacy team member scheduled for:  Sherre Poot, PharmD, [facility] Clinical Pharmacist [facility] [phone] (office) [phone] (mobile)  Dyslipidemia Dyslipidemia is an imbalance of waxy, fat-like substances (lipids) in the blood. The body  needs lipids in small amounts. Dyslipidemia often involves a high level of cholesterol or triglycerides, which are types of lipids. Common forms of dyslipidemia include:  High levels of LDL cholesterol. LDL is the type of cholesterol that causes fatty deposits (plaques) to build up in the blood vessels that carry blood away from your heart (arteries).  Low levels of HDL cholesterol. HDL cholesterol is the type of cholesterol that protects against heart disease. High levels of HDL remove the LDL buildup from arteries.  High levels of triglycerides. Triglycerides are a fatty substance in the blood that is linked to a buildup of plaques in the arteries. What are the causes? Primary dyslipidemia is caused by changes (mutations) in genes that are passed down through families (inherited). These mutations cause several types of dyslipidemia. Secondary dyslipidemia is caused by lifestyle choices and diseases that lead to dyslipidemia, such as:  Eating a diet that is high in animal fat.  Not getting enough exercise.  Having diabetes, kidney disease, liver disease, or thyroid disease.  Drinking large amounts of alcohol.  Using certain medicines. What increases the risk? You are more likely to develop this condition if you are an older man or if you are a woman who has gone through menopause. Other risk factors include:  Having a family history of dyslipidemia.  Taking certain medicines, including birth control pills, steroids, some diuretics, and beta-blockers.  Smoking cigarettes.  Eating a high-fat diet.  Having certain medical conditions such as diabetes, polycystic ovary syndrome (PCOS), kidney disease, liver disease, or hypothyroidism.  Not exercising regularly.  Being overweight or obese with too much belly fat. What are the signs or symptoms? In most cases, dyslipidemia does not usually cause any symptoms. In severe cases, very high lipid levels can cause:  Fatty bumps under the  skin (xanthomas).  White or gray ring around the black center (pupil) of the eye. Very high triglyceride levels can cause inflammation of the pancreas (pancreatitis). How is this diagnosed? Your health care provider may diagnose dyslipidemia based on a routine blood test (fasting blood test). Because most people do not have symptoms of the condition, this blood testing (lipid profile) is done on adults age 16 and older and is repeated every 5 years. This test checks:  Total cholesterol. This measures the total amount of cholesterol in your blood, including LDL cholesterol, HDL cholesterol, and triglycerides. A healthy number is below 200.  LDL cholesterol. The target number for LDL cholesterol is different for each person, depending on individual risk factors. Ask your health care provider what your LDL cholesterol should be.  HDL cholesterol. An HDL level of 60 or higher is best because it helps to protect against heart disease. A number below 9 for men or below 64 for women increases the risk for heart disease.  Triglycerides. A healthy triglyceride number is below 150. If your lipid profile is abnormal, your health care provider may do other blood tests. How is this treated? Treatment depends on the type of dyslipidemia that you have and your other risk factors for heart disease and stroke. Your health care provider will have a target range for your lipid levels based on this information. For many people, this condition may be treated by  lifestyle changes, such as diet and exercise. Your health care provider may recommend that you:  Get regular exercise.  Make changes to your diet.  Quit smoking if you smoke. If diet changes and exercise do not help you reach your goals, your health care provider may also prescribe medicine to lower lipids. The most commonly prescribed type of medicine lowers your LDL cholesterol (statin drug). If you have a high triglyceride level, your provider may  prescribe another type of drug (fibrate) or an omega-3 fish oil supplement, or both. Follow these instructions at home:  Eating and drinking  Follow instructions from your health care provider or dietitian about eating or drinking restrictions.  Eat a healthy diet as told by your health care provider. This can help you reach and maintain a healthy weight, lower your LDL cholesterol, and raise your HDL cholesterol. This may include: ? Limiting your calories, if you are overweight. ? Eating more fruits, vegetables, whole grains, fish, and lean meats. ? Limiting saturated fat, trans fat, and cholesterol.  If you drink alcohol: ? Limit how much you use. ? Be aware of how much alcohol is in your drink. In the [place] one drink equals one 12 oz bottle of beer (355 mL), one 5 oz glass of wine (148 mL), or one 1 oz glass of hard liquor (44 mL).  Do not drink alcohol if: ? Your health care provider tells you not to drink. ? You are pregnant, may be pregnant, or are planning to become pregnant. Activity  Get regular exercise. Start an exercise and strength training program as told by your health care provider. Ask your health care provider what activities are safe for you. Your health care provider may recommend: ? 30 minutes of aerobic activity 4-6 days a week. Brisk walking is an example of aerobic activity. ? Strength training 2 days a week. General instructions  Do not use any products that contain nicotine or tobacco, such as cigarettes, e-cigarettes, and chewing tobacco. If you need help quitting, ask your health care provider.  Take over-the-counter and prescription medicines only as told by your health care provider. This includes supplements.  Keep all follow-up visits as told by your health care provider. Contact a health care provider if:  You are: ? Having trouble sticking to your exercise or diet plan. ? Struggling to quit smoking or control your use of  alcohol. Summary  Dyslipidemia often involves a high level of cholesterol or triglycerides, which are types of lipids.  Treatment depends on the type of dyslipidemia that you have and your other risk factors for heart disease and stroke.  For many people, treatment starts with lifestyle changes, such as diet and exercise.  Your health care provider may prescribe medicine to lower lipids. This information is not intended to replace advice given to you by your health care provider. Make sure you discuss any questions you have with your health care provider. Document Revised: 01/25/2018 Document Reviewed: 01/01/2018 Elsevier Patient Education  [place].

## 2020-05-20 ENCOUNTER — Other Ambulatory Visit: Payer: Self-pay | Admitting: Family Medicine

## 2020-05-20 MED ORDER — XIGDUO XR 5-500 MG PO TB24: 1.0000 | ORAL_TABLET | Freq: Every day | ORAL | 3 refills | Status: DC

## 2020-05-20 MED ORDER — FLUOXETINE HCL 20 MG PO TABS
60.0000 mg | ORAL_TABLET | Freq: Every day | ORAL | 3 refills | Status: DC
Start: 2020-05-20 — End: 2020-08-29

## 2020-05-22 ENCOUNTER — Telehealth: Payer: Self-pay

## 2020-05-22 NOTE — Chronic Care Management (AMB) (Signed)
    Chronic Care Management Pharmacy Assistant   Name: Alicia Jordan  MRN: [id number] DOB: 1945/10/25  Reason for Encounter: Patient Assistance Coordination  PCP : [name], MD   05/22/2020- Patient Assistance Application started for Xigduo XR 5/500 mg with [employer]. Form sent to Donette Larry, CPP. Awaiting provider signature, patient signature and income documentation.   Called patient she is aware of patient assistance paperwork for Xigduo, patient will come by the office 05/23/20 to sign and have income documentation to place on the form. Donette Larry, CPP notified.   Allergies:   Allergies  Allergen Reactions  . Meloxicam     Medications: Outpatient Encounter Medications as of 05/22/2020  Medication Sig  . Acetaminophen (TYLENOL PO) Take by mouth as needed.  . calcium-vitamin D (OSCAL WITH D) 500-200 MG-UNIT tablet Take 1 tablet by mouth.  . Cetirizine HCl (ZYRTEC ALLERGY) 10 MG CAPS Take by mouth daily.  . Dapagliflozin-metFORMIN HCl ER (XIGDUO XR) 5-500 MG TB24 Take 1 tablet by mouth daily.  . diphenhydrAMINE (BENADRYL) 25 mg capsule Take 25 mg by mouth every 6 (six) hours as needed.  Marland Kitchen FLUCELVAX QUADRIVALENT SUSP injection   . FLUoxetine (PROZAC) 20 MG tablet Take 3 tablets (60 mg total) by mouth daily.  Marland Kitchen losartan-hydrochlorothiazide (HYZAAR) 100-25 MG tablet Take 1 tablet by mouth daily.  . pantoprazole (PROTONIX) 40 MG tablet Take 1 tablet (40 mg total) by mouth 2 (two) times daily.  . simvastatin (ZOCOR) 40 MG tablet TAKE 1 TABLET BY MOUTH AT BEDTIME   Facility-Administered Encounter Medications as of 05/22/2020  Medication  . triamcinolone acetonide (KENALOG-40) injection 80 mg    Current Diagnosis: Patient Active Problem List   Diagnosis Date Noted  . Chronic pain of both knees 04/17/2020  . Pedal edema 01/20/2020  . Chronic right shoulder pain 08/21/2019  . Dyslipidemia associated with type 2 diabetes mellitus ([place]) 08/11/2019  . Essential hypertension,  benign 08/11/2019  . Mixed hyperlipidemia 08/11/2019  . GERD without esophagitis 04/18/2009  . DYSPHAGIA 04/18/2009  . ABDOMINAL PAIN-EPIGASTRIC 04/18/2009     Follow-Up:  Patient Assistance Coordination   Pattricia Boss, [place] Pharmacist Assistant [phone]

## 2020-05-31 ENCOUNTER — Encounter: Payer: Self-pay | Admitting: Family Medicine

## 2020-05-31 ENCOUNTER — Other Ambulatory Visit: Payer: Self-pay

## 2020-05-31 DIAGNOSIS — I493 Ventricular premature depolarization: Secondary | ICD-10-CM

## 2020-05-31 DIAGNOSIS — R9431 Abnormal electrocardiogram [ECG] [EKG]: Secondary | ICD-10-CM | POA: Diagnosis not present

## 2020-06-06 ENCOUNTER — Encounter: Payer: Self-pay | Admitting: Family Medicine

## 2020-06-06 DIAGNOSIS — I340 Nonrheumatic mitral (valve) insufficiency: Secondary | ICD-10-CM | POA: Diagnosis not present

## 2020-06-06 DIAGNOSIS — I493 Ventricular premature depolarization: Secondary | ICD-10-CM | POA: Diagnosis not present

## 2020-06-06 DIAGNOSIS — Z01810 Encounter for preprocedural cardiovascular examination: Secondary | ICD-10-CM | POA: Diagnosis not present

## 2020-06-26 ENCOUNTER — Other Ambulatory Visit: Payer: Self-pay | Admitting: Physician Assistant

## 2020-07-13 ENCOUNTER — Telehealth: Payer: Self-pay

## 2020-07-13 NOTE — Telephone Encounter (Cosign Needed)
Patient called reported since starting Xigudo 5/500 mg daily her blood sugars have increased. She was previously taking higher doses of metformin. Patient states that she is tolerating medication well. She has medication supplied through patient assistance.   Patient reports recent fasting blood sugar of 191, 185, 174, 165.   Recommend patient begin taking Xigduo XR 5/500 2 tablets daily and follow-up with blood sugar log in 1 week. Please print new prescription for pharmacist to fax to [facility] and [place] patient assistance if approve.   Pharmacist will follow-up with patient to assess blood sugar readings.   Sherre Poot, PharmD, [facility] Clinical Pharmacist [facility] [phone] (office) [phone] (mobile)

## 2020-07-16 ENCOUNTER — Other Ambulatory Visit: Payer: Self-pay | Admitting: Family Medicine

## 2020-07-16 MED ORDER — XIGDUO XR 10-1000 MG PO TB24: 1.0000 | ORAL_TABLET | Freq: Every day | ORAL | 1 refills | Status: DC

## 2020-07-16 MED ORDER — XIGDUO XR 10-1000 MG PO TB24
1.0000 | ORAL_TABLET | Freq: Every day | ORAL | 1 refills | Status: DC
Start: 2020-07-16 — End: 2020-07-23

## 2020-07-16 NOTE — Telephone Encounter (Signed)
I left message with Mr. Juliana to have her increase the xigduo 5/500 mg to 2 daily.

## 2020-07-19 ENCOUNTER — Other Ambulatory Visit: Payer: Self-pay

## 2020-07-19 ENCOUNTER — Telehealth: Payer: Self-pay

## 2020-07-19 NOTE — Telephone Encounter (Signed)
PA for xigduo submitted and denied via covermymeds. [facility] states that patient has to try and fail 2 formulary alternatives: Synjardy XR, synjardy, Invokamet XR, and Invokamet.

## 2020-07-20 NOTE — Telephone Encounter (Signed)
Call pt and request we change her from xigduo to synjardy xr 12.10/998 mg once daily. Send 90 day refill.

## 2020-07-23 ENCOUNTER — Other Ambulatory Visit: Payer: Self-pay

## 2020-07-23 MED ORDER — SYNJARDY XR 12.5-1000 MG PO TB24: 1.0000 | ORAL_TABLET | Freq: Every day | ORAL | 0 refills | Status: DC

## 2020-07-23 NOTE — Telephone Encounter (Signed)
Attempted to call x 2, line was busy

## 2020-07-23 NOTE — Telephone Encounter (Signed)
Rx changed and sent to pharmacy. Husband to inform patient.

## 2020-08-03 ENCOUNTER — Telehealth: Payer: PPO

## 2020-08-07 ENCOUNTER — Other Ambulatory Visit: Payer: Self-pay

## 2020-08-07 ENCOUNTER — Telehealth (INDEPENDENT_AMBULATORY_CARE_PROVIDER_SITE_OTHER): Payer: PPO | Admitting: Family Medicine

## 2020-08-07 VITALS — HR 87 | Temp 97.4°F

## 2020-08-07 DIAGNOSIS — U071 COVID-19: Secondary | ICD-10-CM | POA: Diagnosis not present

## 2020-08-07 DIAGNOSIS — Z20822 Contact with and (suspected) exposure to covid-19: Secondary | ICD-10-CM

## 2020-08-07 DIAGNOSIS — J988 Other specified respiratory disorders: Secondary | ICD-10-CM

## 2020-08-07 LAB — POC COVID19 BINAXNOW: SARS Coronavirus 2 Ag: POSITIVE — AB

## 2020-08-07 NOTE — Progress Notes (Deleted)
Subjective:  Patient ID: Alicia Jordan, female    DOB: Jan 13, 1946  Age: 75 y.o. MRN: [id number]  No chief complaint on file.   HPI   Dyslipidemia associated with type 2 diabetes mellitus ([place]) -Patient is taking synjardy 12.10-998 mg every day. Mixed hyperlipidemia - Patient takes simvastatin 40 mg daily.  Essential hypertension - Patient is taking losartan-HCTZ 100-25 mg daily.  GERD without esophagitis Patient takes pantoprazole 40 mg twice a day.    Current Outpatient Medications on File Prior to Visit  Medication Sig Dispense Refill  . Acetaminophen (TYLENOL PO) Take by mouth as needed.    . calcium-vitamin D (OSCAL WITH D) 500-200 MG-UNIT tablet Take 1 tablet by mouth.    . Cetirizine HCl (ZYRTEC ALLERGY) 10 MG CAPS Take by mouth daily.    . diphenhydrAMINE (BENADRYL) 25 mg capsule Take 25 mg by mouth every 6 (six) hours as needed.    . Empagliflozin-metFORMIN HCl ER (SYNJARDY XR) 12.10-998 MG TB24 Take 1 tablet by mouth daily. 90 tablet 0  . FLUCELVAX QUADRIVALENT SUSP injection     . FLUoxetine (PROZAC) 20 MG tablet Take 3 tablets (60 mg total) by mouth daily. 90 tablet 3  . losartan-hydrochlorothiazide (HYZAAR) 100-25 MG tablet Take 1 tablet by mouth daily. 90 tablet 1  . pantoprazole (PROTONIX) 40 MG tablet Take 1 tablet (40 mg total) by mouth 2 (two) times daily. 180 tablet 1  . simvastatin (ZOCOR) 40 MG tablet TAKE 1 TABLET BY MOUTH AT BEDTIME 90 tablet 0   Current Facility-Administered Medications on File Prior to Visit  Medication Dose Route Frequency Provider Last Rate Last Admin  . triamcinolone acetonide (KENALOG-40) injection 80 mg  80 mg Intra-articular Once [name], MD       Past Medical History:  Diagnosis Date  . Allergy   . Anxiety   . Arthralgia of left temporomandibular joint   . Arthritis   . Cervicalgia   . Chronic kidney disease, stage 3a ([place])   . Depression   . Diabetes mellitus without complication ([place])   . Hesitancy of  micturition   . Hyperlipidemia   . Hypertension   . Osteoporosis   . Other psoriasis   . Other specified menopausal and perimenopausal disorders   . Overweight   . Repeated falls   . Residual hemorrhoidal skin tags    Past Surgical History:  Procedure Laterality Date  . ABDOMINAL HYSTERECTOMY    . APPENDECTOMY    . CARPAL TUNNEL RELEASE Right   . CHOLECYSTECTOMY    . COLONOSCOPY    . eye lid surgery    . KNEE SURGERY Left     Family History  Problem Relation Age of Onset  . Colon cancer Daughter   . Diabetes type II Mother   . Hyperlipidemia Mother   . Hypertension Mother   . Heart attack Father   . Hyperlipidemia Father   . Hypertension Father   . Stroke Father   . Diabetes type II Father   . Esophageal cancer Neg Hx   . Rectal cancer Neg Hx   . Stomach cancer Neg Hx    Social History   Socioeconomic History  . Marital status: Married    Spouse name: Not on file  . Number of children: 4  . Years of education: Not on file  . Highest education level: Not on file  Occupational History  . Occupation: Homemaker  Tobacco Use  . Smoking status: Never Smoker  . Smokeless tobacco: Never  Used  Vaping Use  . Vaping Use: Never used  Substance and Sexual Activity  . Alcohol use: Never  . Drug use: Never  . Sexual activity: Not on file  Other Topics Concern  . Not on file  Social History Narrative  . Not on file   Social Determinants of Health   Financial Resource Strain: Not on file  Food Insecurity: No Food Insecurity  . Worried About Charity fundraiser in the Last Year: Never true  . Ran Out of Food in the Last Year: Never true  Transportation Needs: Not on file  Physical Activity: Not on file  Stress: Not on file  Social Connections: Not on file    Review of Systems  Constitutional: Negative for chills, fatigue and fever.  HENT: Negative for congestion, ear pain and sore throat.   Respiratory: Negative for cough and shortness of breath.    Cardiovascular: Negative for chest pain and palpitations.  Gastrointestinal: Negative for abdominal pain, constipation, diarrhea, nausea and vomiting.  Endocrine: Negative for polydipsia, polyphagia and polyuria.  Genitourinary: Negative for difficulty urinating and dysuria.  Musculoskeletal: Negative for arthralgias, back pain and myalgias.  Skin: Negative for rash.  Neurological: Negative for headaches.  Psychiatric/Behavioral: Negative for dysphoric mood. The patient is not nervous/anxious.      Objective:  There were no vitals taken for this visit.  BP/Weight 05/04/2020 11/01/8414 6/0/6301  Systolic BP 601 093 235  Diastolic BP 58 68 60  Wt. (Lbs) 142.8 143 149.6  BMI 26.12 26.16 27.36    Physical Exam Vitals reviewed.  Constitutional:      Appearance: Normal appearance.  Neck:     Vascular: No carotid bruit.  Cardiovascular:     Rate and Rhythm: Normal rate and regular rhythm.     Pulses: Normal pulses.     Heart sounds: Normal heart sounds.  Pulmonary:     Effort: Pulmonary effort is normal.     Breath sounds: Normal breath sounds.  Abdominal:     General: Bowel sounds are normal.     Palpations: Abdomen is soft.     Tenderness: There is no abdominal tenderness.  Neurological:     Mental Status: She is alert and oriented to person, place, and time.  Psychiatric:        Mood and Affect: Mood normal.        Behavior: Behavior normal.     Diabetic Foot Exam - Simple   No data filed      Lab Results  Component Value Date   WBC 6.8 05/04/2020   HGB 11.4 05/04/2020   HCT 35.7 05/04/2020   PLT 215 05/04/2020   GLUCOSE 146 (H) 05/04/2020   CHOL 159 05/04/2020   TRIG 157 (H) 05/04/2020   HDL 52 05/04/2020   LDLCALC 80 05/04/2020   ALT 9 05/04/2020   AST 10 05/04/2020   NA 141 05/04/2020   K 4.5 05/04/2020   CL 101 05/04/2020   CREATININE 1.30 (H) 05/04/2020   BUN 28 (H) 05/04/2020   CO2 23 05/04/2020   TSH 3.590 05/04/2020   INR 1.0 05/04/2020    HGBA1C 7.2 (H) 05/04/2020   MICROALBUR 30 05/04/2020      Assessment & Plan:   1. Dyslipidemia associated with type 2 diabetes mellitus ([place])  2. Mixed hyperlipidemia  3. Essential hypertension  4. GERD without esophagitis    No orders of the defined types were placed in this encounter.   No orders of the defined  types were placed in this encounter.     I spent < time > minutes dedicated to the care of this patient on the date of this encounter to include face-to-face time with the patient, as well as: ***  Follow-up: No follow-ups on file.  An After Visit Summary was printed and given to the patient.  [name], MD [facility] [phone]

## 2020-08-07 NOTE — Progress Notes (Signed)
Acute Office Visit  Subjective:    Patient ID: Alicia Jordan, female    DOB: 08/31/1945, 75 y.o.   MRN: [id number]  Chief Complaint  Patient presents with  . Cough    HPI Patient is in today for cough, PND, no fever. Covid positive has been fully vaccinated. Rapid Covid was negative of Saturday but tested positive today. Husband was positive on Saturday-had a cough for 5 days and temp.  Past Medical History:  Diagnosis Date  . Allergy   . Anxiety   . Arthralgia of left temporomandibular joint   . Arthritis   . Cervicalgia   . Chronic kidney disease, stage 3a ([place])   . Depression   . Diabetes mellitus without complication ([place])   . Hesitancy of micturition   . Hyperlipidemia   . Hypertension   . Osteoporosis   . Other psoriasis   . Other specified menopausal and perimenopausal disorders   . Overweight   . Repeated falls   . Residual hemorrhoidal skin tags     Past Surgical History:  Procedure Laterality Date  . ABDOMINAL HYSTERECTOMY    . APPENDECTOMY    . CARPAL TUNNEL RELEASE Right   . CHOLECYSTECTOMY    . COLONOSCOPY    . eye lid surgery    . KNEE SURGERY Left     Family History  Problem Relation Age of Onset  . Colon cancer Daughter   . Diabetes type II Mother   . Hyperlipidemia Mother   . Hypertension Mother   . Heart attack Father   . Hyperlipidemia Father   . Hypertension Father   . Stroke Father   . Diabetes type II Father   . Esophageal cancer Neg Hx   . Rectal cancer Neg Hx   . Stomach cancer Neg Hx     Social History   Socioeconomic History  . Marital status: Married    Spouse name: Not on file  . Number of children: 4  . Years of education: Not on file  . Highest education level: Not on file  Occupational History  . Occupation: Homemaker  Tobacco Use  . Smoking status: Never Smoker  . Smokeless tobacco: Never Used  Vaping Use  . Vaping Use: Never used  Substance and Sexual Activity  . Alcohol use: Never  . Drug use: Never   . Sexual activity: Not on file  Other Topics Concern  . Not on file  Social History Narrative  . Not on file   Social Determinants of Health   Financial Resource Strain: Not on file  Food Insecurity: No Food Insecurity  . Worried About Charity fundraiser in the Last Year: Never true  . Ran Out of Food in the Last Year: Never true  Transportation Needs: Not on file  Physical Activity: Not on file  Stress: Not on file  Social Connections: Not on file  Intimate Partner Violence: Not on file    Outpatient Medications Prior to Visit  Medication Sig Dispense Refill  . Acetaminophen (TYLENOL PO) Take by mouth as needed.    . calcium-vitamin D (OSCAL WITH D) 500-200 MG-UNIT tablet Take 1 tablet by mouth.    . Cetirizine HCl (ZYRTEC ALLERGY) 10 MG CAPS Take by mouth daily.    . diphenhydrAMINE (BENADRYL) 25 mg capsule Take 25 mg by mouth every 6 (six) hours as needed.    . Empagliflozin-metFORMIN HCl ER (SYNJARDY XR) 12.10-998 MG TB24 Take 1 tablet by mouth daily. 90 tablet 0  .  FLUCELVAX QUADRIVALENT SUSP injection     . FLUoxetine (PROZAC) 20 MG tablet Take 3 tablets (60 mg total) by mouth daily. 90 tablet 3  . losartan-hydrochlorothiazide (HYZAAR) 100-25 MG tablet Take 1 tablet by mouth daily. 90 tablet 1  . pantoprazole (PROTONIX) 40 MG tablet Take 1 tablet (40 mg total) by mouth 2 (two) times daily. 180 tablet 1  . simvastatin (ZOCOR) 40 MG tablet TAKE 1 TABLET BY MOUTH AT BEDTIME 90 tablet 0   Facility-Administered Medications Prior to Visit  Medication Dose Route Frequency Provider Last Rate Last Admin  . triamcinolone acetonide (KENALOG-40) injection 80 mg  80 mg Intra-articular Once [name], MD        Allergies  Allergen Reactions  . Meloxicam     Review of Systems  Constitutional: Positive for fatigue. Negative for fever.       No acute distress  HENT: Negative for ear pain and sore throat.   Respiratory: Positive for cough and shortness of breath.         Objective:    Physical Exam Patient is in NAD. Visit done at the car.   Pulse 87   Temp (!) 97.4 F (36.3 C)   SpO2 98%  Wt Readings from Last 3 Encounters:  05/04/20 142 lb 12.8 oz (64.8 kg)  03/15/20 143 lb (64.9 kg)  01/20/20 149 lb 9.6 oz (67.9 kg)    Health Maintenance Due  Topic Date Due  . Hepatitis C Screening  Never done  . DEXA SCAN  07/21/2019  . MAMMOGRAM  07/22/2019  . OPHTHALMOLOGY EXAM  01/26/2020    There are no preventive care reminders to display for this patient.   Lab Results  Component Value Date   TSH 3.590 05/04/2020   Lab Results  Component Value Date   WBC 6.8 05/04/2020   HGB 11.4 05/04/2020   HCT 35.7 05/04/2020   MCV 85 05/04/2020   PLT 215 05/04/2020   Lab Results  Component Value Date   NA 141 05/04/2020   K 4.5 05/04/2020   CO2 23 05/04/2020   GLUCOSE 146 (H) 05/04/2020   BUN 28 (H) 05/04/2020   CREATININE 1.30 (H) 05/04/2020   BILITOT 0.8 05/04/2020   ALKPHOS 58 05/04/2020   AST 10 05/04/2020   ALT 9 05/04/2020   PROT 7.1 05/04/2020   ALBUMIN 4.7 05/04/2020   CALCIUM 10.4 (H) 05/04/2020   Lab Results  Component Value Date   CHOL 159 05/04/2020   Lab Results  Component Value Date   HDL 52 05/04/2020   Lab Results  Component Value Date   LDLCALC 80 05/04/2020   Lab Results  Component Value Date   TRIG 157 (H) 05/04/2020   Lab Results  Component Value Date   CHOLHDL 3.1 05/04/2020   Lab Results  Component Value Date   HGBA1C 7.2 (H) 05/04/2020       Assessment & Plan:  1. Respiratory tract infection due to COVID-19 virus - recommend isolation. Push fluids. Rest.  Antitussives prn. Antipyretics prn. - POC COVID-19 positive.  I spent 10 minutes dedicated to the care of this patient on the date of this encounter to include face-to-face time   Follow-up: No follow-ups on file.  An After Visit Summary was printed and given to the patient.  [name], MD [facility] [phone]

## 2020-08-10 ENCOUNTER — Telehealth: Payer: PPO

## 2020-08-10 ENCOUNTER — Telehealth: Payer: Self-pay

## 2020-08-10 ENCOUNTER — Other Ambulatory Visit: Payer: Self-pay | Admitting: Family Medicine

## 2020-08-10 MED ORDER — HYDROCODONE-HOMATROPINE 5-1.5 MG/5ML PO SYRP: 5.0000 mL | ORAL_SOLUTION | Freq: Three times a day (TID) | ORAL | 0 refills | Status: DC | PRN

## 2020-08-10 NOTE — Progress Notes (Deleted)
Chronic Care Management Pharmacy Note  08/10/2020 Name:  Alicia Jordan MRN:  [id number] DOB:  November 26, 1945  Subjective: Alicia Jordan is an 75 y.o. year old female who is a primary patient of Jordan, Kirsten, MD.  The CCM team was consulted for assistance with disease management and care coordination needs.    {CCMTELEPHONEFACETOFACE:21091510} for {CCMINITIALFOLLOWUPCHOICE:21091511} in response to provider referral for pharmacy case management and/or care coordination services.   Consent to Services:  {CCMCONSENTOPTIONS:25074}  Patient Care Team: Alicia Brome, MD as PCP - General (Family Medicine) Alicia Salm, MD as Referring Physician (Orthopedic Surgery) Burnice Logan, [facility] as Pharmacist (Pharmacist)  Recent office visits: ***  Recent consult visits: [facility] visits: {Hospital DC Yes/No:25215}  Objective:  Lab Results  Component Value Date   CREATININE 1.30 (H) 05/04/2020   BUN 28 (H) 05/04/2020   GFRNONAA 41 (L) 05/04/2020   GFRAA 47 (L) 05/04/2020   NA 141 05/04/2020   K 4.5 05/04/2020   CALCIUM 10.4 (H) 05/04/2020   CO2 23 05/04/2020    Lab Results  Component Value Date/Time   HGBA1C 7.2 (H) 05/04/2020 09:35 AM   HGBA1C 6.5 (H) 11/09/2019 09:53 AM   MICROALBUR 30 05/04/2020 09:13 AM   MICROALBUR 80 08/11/2019 09:50 AM    Last diabetic Eye exam:  Lab Results  Component Value Date/Time   HMDIABEYEEXA No Retinopathy 01/26/2019 12:00 AM    Last diabetic Foot exam: No results found for: HMDIABFOOTEX   Lab Results  Component Value Date   CHOL 159 05/04/2020   HDL 52 05/04/2020   LDLCALC 80 05/04/2020   TRIG 157 (H) 05/04/2020   CHOLHDL 3.1 05/04/2020    Hepatic Function Latest Ref Rng & Units 05/04/2020 11/09/2019 08/11/2019  Total Protein 6.0 - 8.5 g/dL 7.1 6.9 7.0  Albumin 3.7 - 4.7 g/dL 4.7 4.7 4.8(H)  AST 0 - 40 IU/L $Remov'10 13 13  'Rxzzca$ ALT 0 - 32 IU/L 9 9 -  Alk Phosphatase 44 - 121 IU/L 58 63 65  Total Bilirubin 0.0 - 1.2 mg/dL 0.8 0.6 0.8     Lab Results  Component Value Date/Time   TSH 3.590 05/04/2020 09:35 AM   TSH 3.170 08/11/2019 09:33 AM    CBC Latest Ref Rng & Units 05/04/2020 11/09/2019 08/11/2019  WBC 3.4 - 10.8 x10E3/uL 6.8 6.0 7.0  Hemoglobin 11.1 - 15.9 g/dL 11.4 10.9(L) 11.9  Hematocrit 34.0 - 46.6 % 35.7 35.2 36.7  Platelets 150 - 450 x10E3/uL 215 174 234    No results found for: VD25OH  Clinical ASCVD: {YES/NO:21197} The 10-year ASCVD risk score Mikey Bussing DC Jr., et al., 2013) is: 22.6%   Values used to calculate the score:     Age: 14 years     Sex: Female     Is Non-Hispanic African American: No     Diabetic: Yes     Tobacco smoker: No     Systolic Blood Pressure: 509 mmHg     Is BP treated: Yes     HDL Cholesterol: 52 mg/dL     Total Cholesterol: 159 mg/dL    Depression screen [facility] 2/9 05/04/2020 04/03/2020 08/16/2019  Decreased Interest 0 2 0  Down, Depressed, Hopeless 0 2 0  PHQ - 2 Score 0 4 0  Altered sleeping - 0 -  Tired, decreased energy - 2 -  Change in appetite - 0 -  Feeling bad or failure about yourself  - 0 -  Trouble concentrating - 2 -  Moving slowly  or fidgety/restless - 0 -  Suicidal thoughts - 0 -  PHQ-9 Score - 8 -  Difficult doing work/chores - Very difficult -     ***Other: (CHADS2VASc if Afib, MMRC or CAT for COPD, ACT, DEXA)  Social History   Tobacco Use  Smoking Status Never Smoker  Smokeless Tobacco Never Used   BP Readings from Last 3 Encounters:  05/04/20 (!) 116/58  03/15/20 124/68  01/20/20 (!) 144/60   Pulse Readings from Last 3 Encounters:  08/07/20 87  05/04/20 80  03/15/20 88   Wt Readings from Last 3 Encounters:  05/04/20 142 lb 12.8 oz (64.8 kg)  03/15/20 143 lb (64.9 kg)  01/20/20 149 lb 9.6 oz (67.9 kg)    Assessment/Interventions: Review of patient past medical history, allergies, medications, health status, including review of consultants reports, laboratory and other test data, was performed as part of comprehensive evaluation and  provision of chronic care management services.   SDOH:  (Social Determinants of Health) assessments and interventions performed: {yes/no:20286}   CCM Care Plan  Allergies  Allergen Reactions  . Meloxicam     Medications Reviewed Today    Reviewed by Alicia Brome, MD (Physician) on 05/04/20 at 2017  Med List Status: <None>  Medication Order Taking? Sig Documenting Provider Last Dose Status Informant  Acetaminophen (TYLENOL PO) [id number]  Take by mouth as needed. [provider]  Active   calcium-vitamin D (OSCAL WITH D) 500-200 MG-UNIT tablet [id number]  Take 1 tablet by mouth. [provider]  Active   Cetirizine HCl (ZYRTEC ALLERGY) 10 MG CAPS [id number]  Take by mouth daily. [provider]  Active   dextromethorphan-guaiFENesin [facility] DM) 30-600 MG 12hr tablet [id number]  Take 1 tablet by mouth 2 (two) times daily. Ivy Lynn, NP  Active   diphenhydrAMINE (BENADRYL) 25 mg capsule [id number] Yes Take 25 mg by mouth every 6 (six) hours as needed. [provider]  Active   FLUCELVAX QUADRIVALENT SUSP injection [id number]   [provider]  Active   FLUoxetine (PROZAC) 20 MG tablet [id number]  Take 3 tablets (60 mg total) by mouth daily. Jordan, Kirsten, MD  Active   losartan-hydrochlorothiazide [facility]) 100-25 MG tablet [id number]  Take 1 tablet by mouth daily. CoxElnita Maxwell, MD  Active   metFORMIN (GLUCOPHAGE) 1000 MG tablet [id number]  Take 1 tablet by mouth twice daily Alicia Duncans, PA-C  Active   omeprazole (PRILOSEC) 40 MG capsule [id number]  Take 1 capsule by mouth twice daily Alicia Duncans, PA-C  Active   pantoprazole (PROTONIX) 40 MG tablet [id number]  Take 1 tablet (40 mg total) by mouth 2 (two) times daily. Alicia Brome, MD  Active   simvastatin (ZOCOR) 40 MG tablet [id number]  TAKE 1 TABLET BY MOUTH AT BEDTIME Alicia Duncans, PA-C  Active   triamcinolone acetonide [facility]) injection 80 mg [id number]   Alicia Brome, MD  Active            Patient Active Problem List   Diagnosis Date Noted  . Chronic pain of both knees 04/17/2020  . Pedal edema 01/20/2020  . Chronic right shoulder pain 08/21/2019  . Dyslipidemia associated with type 2 diabetes mellitus ([place]) 08/11/2019  . Essential hypertension, benign 08/11/2019  . Mixed hyperlipidemia 08/11/2019  . GERD without esophagitis 04/18/2009  . DYSPHAGIA 04/18/2009  . ABDOMINAL PAIN-EPIGASTRIC 04/18/2009    Immunization History  Administered Date(s) Administered  . Fluad Quad(high Dose 65+) 02/08/2019  . Influenza-Unspecified 02/27/2020  . Moderna Sars-Covid-2 Vaccination 10/31/2019, 11/28/2019, 05/31/2020  .  Pneumococcal Conjugate-13 05/13/2014  . Pneumococcal Polysaccharide-23 01/29/2017  . Tdap 05/03/2012  . Zoster 05/16/2010  . Zoster Recombinat (Shingrix) 06/11/2017    Conditions to be addressed/monitored:  {USCCMDZASSESSMENTOPTIONS:23563}  There are no care plans that you recently modified to display for this patient.    Medication Assistance: {MEDASSISTANCEINFO:25044}  Patient's preferred pharmacy is:  [facility] [address], [place] [address] [place] [address] Phone: [phone] Fax: [phone]  Uses pill box? {Yes or If no, why not?:20788} Pt endorses ***% compliance  We discussed: {Pharmacy options:24294} Patient decided to: {US Pharmacy Plan:23885}  Care Plan and Follow Up Patient Decision:  {FOLLOWUP:24991}  Plan: {CM FOLLOW UP PLAN:25073}  ***    Current Barriers:  . {pharmacybarriers:24917} . ***  Pharmacist Clinical Goal(s):  Marland Kitchen Over the next *** days, patient will {PHARMACYGOALCHOICES:24921} through collaboration with PharmD and provider.  . ***  Interventions: . 1:1 collaboration with Alicia Brome, MD regarding development and update of comprehensive plan of care as evidenced by provider attestation and co-signature . Inter-disciplinary care team collaboration (see longitudinal  plan of care) . Comprehensive medication review performed; medication list updated in electronic medical record  {CCM PHARMD DISEASE STATES:25130}  Patient Goals/Self-Care Activities . Over the next *** days, patient will:  - {pharmacypatientgoals:24919}  Follow Up Plan: {CM FOLLOW UP GOVP:03403}

## 2020-08-10 NOTE — Telephone Encounter (Signed)
Hydromet sent. kc

## 2020-08-10 NOTE — Telephone Encounter (Cosign Needed)
Pharmacist spoke to patient's husband. Reports that Alicia Jordan is experiencing cough and is requesting something called in to [facility] if possible.   Sherre Poot, PharmD, [facility] [place] [facility] Clinical Pharmacist [facility] [phone] (office) [phone] (mobile)

## 2020-08-13 ENCOUNTER — Encounter: Payer: Self-pay | Admitting: Family Medicine

## 2020-08-16 ENCOUNTER — Ambulatory Visit: Payer: PPO

## 2020-08-29 ENCOUNTER — Other Ambulatory Visit: Payer: Self-pay

## 2020-08-29 ENCOUNTER — Telehealth: Payer: Self-pay

## 2020-08-29 MED ORDER — FLUOXETINE HCL 20 MG PO TABS: 60.0000 mg | ORAL_TABLET | Freq: Every day | ORAL | 2 refills | Status: DC

## 2020-08-29 NOTE — Telephone Encounter (Cosign Needed)
  Chronic Care Management   Note  08/29/2020 Name: Alicia Jordan MRN: [id number] DOB: 1945/09/16  Patient called pharmacist about not having refill of Xigduo from [place] and [place]. Pharmacist contacted [facility] to request refill. Patient is not on automatic refill of medication and will have to request refills when needed each 3 months. Pharmacist coordinated samples of Xigduo until patient assistance supply arrives. Patient's fasting blood sugars have been 130s-140s mainly. She had 1 reading of 150 mg/dL. Patient is recovering from [place] and building back her strength.   Patient also reports that she needs fluoxetine refilled through [facility]. The tablets are very expensive with her insurance and capsules are more affordable. Pharmacist requested capsules sent in to [facility] by Marcy Salvo per patient request.   Follow up plan: Telephone follow up appointment with care management team member scheduled for: 07/202  Sherre Poot, PharmD, [facility] Clinical Pharmacist [facility] [phone] (office) [phone] (mobile)

## 2020-08-30 ENCOUNTER — Other Ambulatory Visit: Payer: Self-pay | Admitting: Family Medicine

## 2020-08-30 ENCOUNTER — Telehealth: Payer: Self-pay

## 2020-08-30 NOTE — Telephone Encounter (Signed)
[facility] called stating pt is requesting to switch prozac tablet to prozac capsules. Requesting change due to cost. Please advise.   Prozac 20 mg   Harrell Lark 08/30/20 2:19 PM

## 2020-08-31 ENCOUNTER — Telehealth: Payer: PPO

## 2020-09-06 ENCOUNTER — Ambulatory Visit (INDEPENDENT_AMBULATORY_CARE_PROVIDER_SITE_OTHER): Payer: PPO

## 2020-09-06 ENCOUNTER — Encounter: Payer: Self-pay | Admitting: Family Medicine

## 2020-09-06 ENCOUNTER — Other Ambulatory Visit: Payer: Self-pay

## 2020-09-06 VITALS — BP 118/64 | HR 74 | Temp 98.7°F | Resp 16 | Ht 62.0 in | Wt 139.2 lb

## 2020-09-06 DIAGNOSIS — M8580 Other specified disorders of bone density and structure, unspecified site: Secondary | ICD-10-CM

## 2020-09-06 DIAGNOSIS — Z6825 Body mass index (BMI) 25.0-25.9, adult: Secondary | ICD-10-CM | POA: Diagnosis not present

## 2020-09-06 DIAGNOSIS — Z0000 Encounter for general adult medical examination without abnormal findings: Secondary | ICD-10-CM

## 2020-09-06 NOTE — Progress Notes (Signed)
Subjective:   Alicia Jordan is a 75 y.o. female who presents for Medicare Annual (Subsequent) preventive examination.  This wellness visit is conducted by a nurse.  The patient's medications were reviewed and reconciled since the patient's last visit.  History details were provided by the patient.  The history appears to be reliable.    Patient's last AWV was one year ago.   Medical History: Patient history and Family history was reviewed  Medications, Allergies, and preventative health maintenance was reviewed and updated.   Review of Systems    Review of Systems  Constitutional: Negative.   HENT: Negative.   Eyes: Negative.   Respiratory: Negative.  Negative for cough, chest tightness and shortness of breath.   Cardiovascular: Negative.  Negative for chest pain and palpitations.  Gastrointestinal: Negative.   Genitourinary: Negative.   Musculoskeletal: Positive for arthralgias. Negative for back pain and joint swelling.       Knee pain  Skin:       Dark colored mole on right buttock  Neurological: Negative.  Negative for dizziness, numbness and headaches.  Psychiatric/Behavioral: Negative for agitation, behavioral problems, confusion, dysphoric mood and suicidal ideas.   Cardiac Risk Factors include: advanced age (>71men, >37 women);diabetes mellitus     Objective:    Today's Vitals   09/06/20 0929  BP: 118/64  Pulse: 74  Resp: 16  Temp: 98.7 F (37.1 C)  Weight: 139 lb 3.2 oz (63.1 kg)  PainSc: 0-No pain   Body mass index is 25.46 kg/m.  Advanced Directives 09/06/2020 08/16/2019  Does Patient Have a Medical Advance Directive? Yes Yes  Type of Paramedic of [place];Living will  Does patient want to make changes to medical advance directive? No - Patient declined No - Patient declined  Copy of [place] in Chart? No - copy requested No - copy requested    Current Medications  (verified) Outpatient Encounter Medications as of 09/06/2020  Medication Sig   Acetaminophen (TYLENOL PO) Take by mouth as needed.   calcium-vitamin D (OSCAL WITH D) 500-200 MG-UNIT tablet Take 1 tablet by mouth.   Cetirizine HCl (ZYRTEC ALLERGY) 10 MG CAPS Take by mouth daily.   diphenhydrAMINE (BENADRYL) 25 mg capsule Take 25 mg by mouth every 6 (six) hours as needed.   losartan-hydrochlorothiazide (HYZAAR) 100-25 MG tablet Take 1 tablet by mouth daily.   pantoprazole (PROTONIX) 40 MG tablet Take 1 tablet (40 mg total) by mouth 2 (two) times daily.   simvastatin (ZOCOR) 40 MG tablet TAKE 1 TABLET BY MOUTH AT BEDTIME   [DISCONTINUED] FLUCELVAX QUADRIVALENT SUSP injection    Dapagliflozin-metFORMIN HCl ER (XIGDUO XR) 10-998 MG TB24 Take 2 tablets by mouth daily. Received from Patient Assistance.   Empagliflozin-metFORMIN HCl ER (SYNJARDY XR) 12.10-998 MG TB24 Take 1 tablet by mouth daily.   FLUoxetine (PROZAC) 20 MG capsule TAKE 3 CAPSULES BY MOUTH ONCE DAILY (Patient not taking: Reported on 09/06/2020)   HYDROcodone-homatropine (HYCODAN) 5-1.5 MG/5ML syrup Take 5 mLs by mouth every 8 (eight) hours as needed for cough. (Patient not taking: Reported on 09/06/2020)   Facility-Administered Encounter Medications as of 09/06/2020  Medication   triamcinolone acetonide (KENALOG-40) injection 80 mg    Allergies (verified) Meloxicam   History: Past Medical History:  Diagnosis Date   Allergy    Anxiety    Arthralgia of left temporomandibular joint    Arthritis    Cervicalgia    Chronic kidney disease, stage 3a ([place])  Depression    Diabetes mellitus without complication ([place])    Hesitancy of micturition    Hyperlipidemia    Hypertension    Osteoporosis    Other psoriasis    Other specified menopausal and perimenopausal disorders    Overweight    Repeated falls    Residual hemorrhoidal skin tags    Past Surgical History:  Procedure Laterality Date    ABDOMINAL HYSTERECTOMY     APPENDECTOMY     CARPAL TUNNEL RELEASE Right    CHOLECYSTECTOMY     COLONOSCOPY     eye lid surgery     KNEE SURGERY Left    Family History  Problem Relation Age of Onset   Colon cancer Daughter    Cancer Daughter    Diabetes type II Mother    Hyperlipidemia Mother    Hypertension Mother    Heart attack Father    Hyperlipidemia Father    Hypertension Father    Stroke Father    Diabetes type II Father    Esophageal cancer Neg Hx    Rectal cancer Neg Hx    Stomach cancer Neg Hx    Social History   Socioeconomic History   Marital status: Married    Spouse name: Joneen Boers   Number of children: 4   Years of education: Not on file   Highest education level: Not on file  Occupational History   Occupation: Homemaker  Tobacco Use   Smoking status: Never Smoker   Smokeless tobacco: Never Used  Scientific laboratory technician Use: Never used  Substance and Sexual Activity   Alcohol use: Never   Drug use: Never   Sexual activity: Not on file  Other Topics Concern   Not on file  Social History Narrative   Dezhane spends a lot if time helping her daughter get to and from oncology appointments   Her husband has started drinking alcohol again   Social Determinants of Health   Financial Resource Strain: Not on file  Food Insecurity: No Food Insecurity   Worried About Charity fundraiser in the Last Year: Never true   Arboriculturist in the Last Year: Never true  Transportation Needs: No Transportation Needs   Lack of Transportation (Medical): No   Lack of Transportation (Non-Medical): No  Physical Activity: Not on file  Stress: Stress Concern Present   Feeling of Stress : To some extent  Social Connections: Not on file    Tobacco Counseling Counseling given: Never Smoker   Clinical Intake:  Pre-visit preparation completed: Yes  Pain : No/denies pain Pain Score: 0-No pain     BMI - recorded: 25.46 Nutritional  Status: BMI 25 -29 Overweight Nutritional Risks: None Diabetes: Yes CBG done?: No (home CBG was 161 this morning) Did pt. bring in CBG monitor from home?: No  How often do you need to have someone help you when you read instructions, pamphlets, or other written materials from your doctor or pharmacy?: 1 - Never  Interpreter Needed?: No      Activities of Daily Living In your present state of health, do you have any difficulty performing the following activities: 09/06/2020  Hearing? N  Vision? N  Difficulty concentrating or making decisions? N  Walking or climbing stairs? Y  Comment knee pain (from fall) when climbing stairs at times  Dressing or bathing? N  Doing errands, shopping? N  Preparing Food and eating ? N  Using the Toilet? N  In the past six  months, have you accidently leaked urine? N  Do you have problems with loss of bowel control? N  Managing your Medications? N  Managing your Finances? N  Housekeeping or managing your Housekeeping? N  Some recent data might be hidden    Patient Care Team: [name], MD as PCP - General (Family Medicine) Jackquline Denmark, MD as Consulting Physician (Gastroenterology) Joya Salm, MD as Referring Physician (Orthopedic Surgery) Burnice Logan, [facility] as Pharmacist (Pharmacist)     Assessment:   This is a routine wellness examination for [place].  Vision screen Last eye exam August 2021  Dietary issues and exercise activities discussed: Current Exercise Habits: The patient does not participate in regular exercise at present, Exercise limited by: None identified  [place] (see longitudinal plan of care for additional care plan information)  Current Barriers:   Chronic Disease Management support, education, and care coordination needs related to Hypertension, Hyperlipidemia, Diabetes, and Depression   Hypertension BP Readings from Last 3 Encounters:  05/04/20 (!) 116/58   03/15/20 124/68  01/20/20 (!) 144/60    Pharmacist Clinical Goal(s): o Over the next 90 days, patient will work with PharmD and providers to maintain BP goal <130/80  Current regimen:  o Losartan-hydrochlorothiazide 100-25 mg daily   Interventions: o Discussed medications and blood pressure control.   Patient self care activities - Over the next 90 days, patient will: o Check BP as needed, document, and provide at future appointments o Ensure daily salt intake < 2300 mg/day  Hyperlipidemia Lab Results  Component Value Date/Time   LDLCALC 80 05/04/2020 09:35 AM    Pharmacist Clinical Goal(s): o Over the next 90 days, patient will work with PharmD and providers to maintain LDL goal < 100  Current regimen:  o Simvastatin 40 mg daily   Interventions: o Medication list reviewed.  o Reviewed generic Lovaza coverage as a more affordable option for patient vs. Vascepa. Patient prefers to wait to begin anything new until after updated lab results next month.   Patient self care activities - Over the next 90 days, patient will: o Continue taking medications as prescribed.   Diabetes Lab Results  Component Value Date/Time   HGBA1C 7.2 (H) 05/04/2020 09:35 AM   HGBA1C 6.5 (H) 11/09/2019 09:53 AM    Pharmacist Clinical Goal(s): o Over the next 90 days, patient will work with PharmD and providers to achieve A1c goal <7%  Current regimen:  o Synjardy   Interventions: o Reviewed home blood sugar readings.  o Patient using Synjardy samples but Farxiga recommended and called in to pharmacy. Determining treatment and patient assistance options.    Patient self care activities - Over the next 90 days, patient will: o Check blood sugar once daily, document, and provide at future appointments o Contact provider with any episodes of hypoglycemia  Depression  Pharmacist Clinical Goal(s) o Over the next 90 days, patient will work with PharmD and providers to manage symptoms of  depression  Current regimen:  o Fluoxetine 60 mg daily   Interventions: o Reviewed patient's current medication and symptoms.  o Discussed the cost savings of fluoxetine capsules vs tablets. Requesting script sent in.   Patient self care activities - Over the next 90 days, patient will: o Continue to take medication as prescribed.   Medication management  Pharmacist Clinical Goal(s): o Over the next 90 days, patient will work with PharmD and providers to maintain optimal  medication adherence  Current pharmacy: [facility]  Interventions o Comprehensive medication review performed. o Continue current medication management strategy  Patient self care activities - Over the next 90 days, patient will: o Focus on medication adherence by pill box o Take medications as prescribed o Report any questions or concerns to PharmD and/or provider(s)  Please see past updates related to this goal by clicking on the "Past Updates" button in the selected goal       Prevent falls      Depression Screen PHQ 2/9 Scores 09/06/2020 05/04/2020 04/03/2020 08/16/2019  PHQ - 2 Score 0 0 4 0  PHQ- 9 Score - - 8 -    Fall Risk Fall Risk  09/06/2020 05/04/2020 11/09/2019 08/16/2019 08/11/2019  Falls in the past year? 1 0 1 1 1   Comment - - - - Patient states she fell Monday tripped over the dog bed in the nedroom.  Number falls in past yr: 0 0 0 0 0  Injury with Fall? 1 - 1 1 1   Risk for fall due to : History of fall(s) - - - -  Follow up Falls evaluation completed;Education provided;Falls prevention discussed - Falls prevention discussed Falls prevention discussed -    FALL RISK PREVENTION PERTAINING TO THE HOME:  Any stairs in or around the home? Yes  If so, are there any without handrails? No  Home free of loose throw rugs in walkways, pet beds, electrical cords, etc? Yes  Adequate lighting in your home to reduce risk of falls? Yes   ASSISTIVE DEVICES UTILIZED TO PREVENT FALLS:  Life  alert? No  Use of a cane, walker or w/c? No  Grab bars in the bathroom? No  Shower chair or bench in shower? No  Elevated toilet seat or a handicapped toilet? No   Gait steady and fast without use of assistive device  Cognitive Function:     6CIT Screen 09/06/2020 08/16/2019  What Year? 0 points 0 points  What month? 0 points 0 points  What time? 0 points 0 points  Count back from 20 0 points 0 points  Months in reverse 0 points 0 points  Repeat phrase 0 points 0 points  Total Score 0 0    Immunizations Immunization History  Administered Date(s) Administered   Fluad Quad(high Dose 65+) 02/08/2019   Influenza-Unspecified 02/27/2020   Moderna Sars-Covid-2 Vaccination 10/31/2019, 11/28/2019, 05/31/2020   Pneumococcal Conjugate-13 05/13/2014   Pneumococcal Polysaccharide-23 01/29/2017   Tdap 05/03/2012   Zoster 05/16/2010   Zoster Recombinat (Shingrix) 06/11/2017    TDAP status: Up to date  Flu Vaccine status: Up to date  Pneumococcal vaccine status: Up to date  Covid-19 vaccine status: Completed vaccines  Screening Tests Health Maintenance  Topic Date Due   OPHTHALMOLOGY EXAM  01/25/2021   MAMMOGRAM  09/06/2021 (Originally 07/22/2019)   DEXA SCAN  09/06/2021 (Originally 07/21/2019)   HEMOGLOBIN A1C  11/01/2020   FOOT EXAM  05/04/2021   TETANUS/TDAP  05/03/2022   COLONOSCOPY (Pts 45-108yrs Insurance coverage will need to be confirmed)  06/14/2029   INFLUENZA VACCINE  Completed   COVID-19 Vaccine  Completed   PNA vac Low Risk Adult  Completed    Health Maintenance  Colorectal cancer screening: Type of screening: Colonoscopy. Completed 06/15/2019. Repeat every 10 years  Mammogram status: Completed 07/21/2018. Repeat every year  Bone Density status: Completed 07/20/2017. Results reflect: Bone density results: OSTEOPENIA. Repeat every 2 years.  Lung Cancer Screening: (Low Dose CT Chest recommended if Age 19-80  years, 30 pack-year currently smoking OR  have quit w/in 15years.) does not qualify.    Additional Screening:  Vision Screening: Recommended annual ophthalmology exams for early detection of glaucoma and other disorders of the eye. Is the patient up to date with their annual eye exam?  Yes  Who is the provider or what is the name of the office in which the patient attends annual eye exams? [place] Screening: Recommended annual dental exams for proper oral hygiene     Plan:     1- Recommended Mammogram and DEXA scan, patient declined 2- Records requested for last diabetic eye screening (01/2020) from [facility] mole on right side of buttock with Dr Tobie Poet at upcoming appointment next week 4- Copy of [place] of Attorney printed from old EMR and entered into Epic 5- Continue OTC Calcium/Vit D for bone health (Osteopenia 07/20/17)   I have personally reviewed and noted the following in the patients chart:    Medical and social history  Use of alcohol, tobacco or illicit drugs   Current medications and supplements  Functional ability and status  Nutritional status  Physical activity  Advanced directives  List of other physicians  Hospitalizations, surgeries, and ER visits in previous 12 months  Vitals  Screenings to include cognitive, depression, and falls  Referrals and appointments  In addition, I have reviewed and discussed with patient certain preventive protocols, quality metrics, and best practice recommendations. A written personalized care plan for preventive services as well as general preventive health recommendations were provided to patient.     Erie Noe, LPN   12/24/6577

## 2020-09-06 NOTE — Patient Instructions (Signed)
Diabetes Mellitus and Foot Care Foot care is an important part of your health, especially when you have diabetes. Diabetes may cause you to have problems because of poor blood flow (circulation) to your feet and legs, which can cause your skin to:  Become thinner and drier.  Break more easily.  Heal more slowly.  Peel and crack. You may also have nerve damage (neuropathy) in your legs and feet, causing decreased feeling in them. This means that you may not notice minor injuries to your feet that could lead to more serious problems. Noticing and addressing any potential problems early is the best way to prevent future foot problems. How to care for your feet Foot hygiene  Wash your feet daily with warm water and mild soap. Do not use hot water. Then, pat your feet and the areas between your toes until they are completely dry. Do not soak your feet as this can dry your skin.  Trim your toenails straight across. Do not dig under them or around the cuticle. File the edges of your nails with an emery board or nail file.  Apply a moisturizing lotion or petroleum jelly to the skin on your feet and to dry, brittle toenails. Use lotion that does not contain alcohol and is unscented. Do not apply lotion between your toes.   Shoes and socks  Wear clean socks or stockings every day. Make sure they are not too tight. Do not wear knee-high stockings since they may decrease blood flow to your legs.  Wear shoes that fit properly and have enough cushioning. Always look in your shoes before you put them on to be sure there are no objects inside.  To break in new shoes, wear them for just a few hours a day. This prevents injuries on your feet. Wounds, scrapes, corns, and calluses  Check your feet daily for blisters, cuts, bruises, sores, and redness. If you cannot see the bottom of your feet, use a mirror or ask someone for help.  Do not cut corns or calluses or try to remove them with medicine.  If  you find a minor scrape, cut, or break in the skin on your feet, keep it and the skin around it clean and dry. You may clean these areas with mild soap and water. Do not clean the area with peroxide, alcohol, or iodine.  If you have a wound, scrape, corn, or callus on your foot, look at it several times a day to make sure it is healing and not infected. Check for: ? Redness, swelling, or pain. ? Fluid or blood. ? Warmth. ? Pus or a bad smell.   General tips  Do not cross your legs. This may decrease blood flow to your feet.  Do not use heating pads or hot water bottles on your feet. They may burn your skin. If you have lost feeling in your feet or legs, you may not know this is happening until it is too late.  Protect your feet from hot and cold by wearing shoes, such as at the beach or on hot pavement.  Schedule a complete foot exam at least once a year (annually) or more often if you have foot problems. Report any cuts, sores, or bruises to your health care provider immediately. Where to find more information  American Diabetes Association: www.diabetes.org  Association of Diabetes Care & Education Specialists: www.diabeteseducator.org Contact a health care provider if:  You have a medical condition that increases your risk of infection  and you have any cuts, sores, or bruises on your feet.  You have an injury that is not healing.  You have redness on your legs or feet.  You feel burning or tingling in your legs or feet.  You have pain or cramps in your legs and feet.  Your legs or feet are numb.  Your feet always feel cold.  You have pain around any toenails. Get help right away if:  You have a wound, scrape, corn, or callus on your foot and: ? You have pain, swelling, or redness that gets worse. ? You have fluid or blood coming from the wound, scrape, corn, or callus. ? Your wound, scrape, corn, or callus feels warm to the touch. ? You have pus or a bad smell coming  from the wound, scrape, corn, or callus. ? You have a fever. ? You have a red line going up your leg. Summary  Check your feet every day for blisters, cuts, bruises, sores, and redness.  Apply a moisturizing lotion or petroleum jelly to the skin on your feet and to dry, brittle toenails.  Wear shoes that fit properly and have enough cushioning.  If you have foot problems, report any cuts, sores, or bruises to your health care provider immediately.  Schedule a complete foot exam at least once a year (annually) or more often if you have foot problems. This information is not intended to replace advice given to you by your health care provider. Make sure you discuss any questions you have with your health care provider. Document Revised: 12/22/2019 Document Reviewed: 12/22/2019 Elsevier Patient Education  2021 [place] Prevention in the Home, Adult Falls can cause injuries and can happen to people of all ages. There are many things you can do to make your home safe and to help prevent falls. Ask for help when making these changes. What actions can I take to prevent falls? General Instructions  Use good lighting in all rooms. Replace any light bulbs that burn out.  Turn on the lights in dark areas. Use night-lights.  Keep items that you use often in easy-to-reach places. Lower the shelves around your home if needed.  Set up your furniture so you have a clear path. Avoid moving your furniture around.  Do not have throw rugs or other things on the floor that can make you trip.  Avoid walking on wet floors.  If any of your floors are uneven, fix them.  Add color or contrast paint or tape to clearly mark and help you see: ? Grab bars or handrails. ? First and last steps of staircases. ? Where the edge of each step is.  If you use a stepladder: ? Make sure that it is fully opened. Do not climb a closed stepladder. ? Make sure the sides of the stepladder are locked in  place. ? Ask someone to hold the stepladder while you use it.  Know where your pets are when moving through your home. What can I do in the bathroom?  Keep the floor dry. Clean up any water on the floor right away.  Remove soap buildup in the tub or shower.  Use nonskid mats or decals on the floor of the tub or shower.  Attach bath mats securely with double-sided, nonslip rug tape.  If you need to sit down in the shower, use a plastic, nonslip stool.  Install grab bars by the toilet and in the tub and shower. Do not use  towel bars as grab bars.      What can I do in the bedroom?  Make sure that you have a light by your bed that is easy to reach.  Do not use any sheets or blankets for your bed that hang to the floor.  Have a firm chair with side arms that you can use for support when you get dressed. What can I do in the kitchen?  Clean up any spills right away.  If you need to reach something above you, use a step stool with a grab bar.  Keep electrical cords out of the way.  Do not use floor polish or wax that makes floors slippery. What can I do with my stairs?  Do not leave any items on the stairs.  Make sure that you have a light switch at the top and the bottom of the stairs.  Make sure that there are handrails on both sides of the stairs. Fix handrails that are broken or loose.  Install nonslip stair treads on all your stairs.  Avoid having throw rugs at the top or bottom of the stairs.  Choose a carpet that does not hide the edge of the steps on the stairs.  Check carpeting to make sure that it is firmly attached to the stairs. Fix carpet that is loose or worn. What can I do on the outside of my home?  Use bright outdoor lighting.  Fix the edges of walkways and driveways and fix any cracks.  Remove anything that might make you trip as you walk through a door, such as a raised step or threshold.  Trim any bushes or trees on paths to your home.  Check  to see if handrails are loose or broken and that both sides of all steps have handrails.  Install guardrails along the edges of any raised decks and porches.  Clear paths of anything that can make you trip, such as tools or rocks.  Have leaves, snow, or ice cleared regularly.  Use sand or salt on paths during winter.  Clean up any spills in your garage right away. This includes grease or oil spills. What other actions can I take?  Wear shoes that: ? Have a low heel. Do not wear high heels. ? Have rubber bottoms. ? Feel good on your feet and fit well. ? Are closed at the toe. Do not wear open-toe sandals.  Use tools that help you move around if needed. These include: ? Canes. ? Walkers. ? Scooters. ? Crutches.  Review your medicines with your doctor. Some medicines can make you feel dizzy. This can increase your chance of falling. Ask your doctor what else you can do to help prevent falls. Where to find more information  Centers for Disease Control and Prevention, [employer]: http://www.wolf.info/  National Institute on Aging: http://kim-miller.com/ Contact a doctor if:  You are afraid of falling at home.  You feel weak, drowsy, or dizzy at home.  You fall at home. Summary  There are many simple things that you can do to make your home safe and to help prevent falls.  Ways to make your home safe include removing things that can make you trip and installing grab bars in the bathroom.  Ask for help when making these changes in your home. This information is not intended to replace advice given to you by your health care provider. Make sure you discuss any questions you have with your health care provider. Document Revised: 01/04/2020 Document  Reviewed: 01/04/2020 Elsevier Patient Education  2021 [place] Maintenance, Female Adopting a healthy lifestyle and getting preventive care are important in promoting health and wellness. Ask your health care provider about:  The  right schedule for you to have regular tests and exams.  Things you can do on your own to prevent diseases and keep yourself healthy. What should I know about diet, weight, and exercise? Eat a healthy diet  Eat a diet that includes plenty of vegetables, fruits, low-fat dairy products, and lean protein.  Do not eat a lot of foods that are high in solid fats, added sugars, or sodium.   Maintain a healthy weight Body mass index (BMI) is used to identify weight problems. It estimates body fat based on height and weight. Your health care provider can help determine your BMI and help you achieve or maintain a healthy weight. Get regular exercise Get regular exercise. This is one of the most important things you can do for your health. Most adults should:  Exercise for at least 150 minutes each week. The exercise should increase your heart rate and make you sweat (moderate-intensity exercise).  Do strengthening exercises at least twice a week. This is in addition to the moderate-intensity exercise.  Spend less time sitting. Even light physical activity can be beneficial. Watch cholesterol and blood lipids Have your blood tested for lipids and cholesterol at 73 years of age, then have this test every 5 years. Have your cholesterol levels checked more often if:  Your lipid or cholesterol levels are high.  You are older than 73 years of age.  You are at high risk for heart disease. What should I know about cancer screening? Depending on your health history and family history, you may need to have cancer screening at various ages. This may include screening for:  Breast cancer.  Cervical cancer.  Colorectal cancer.  Skin cancer.  Lung cancer. What should I know about heart disease, diabetes, and high blood pressure? Blood pressure and heart disease  High blood pressure causes heart disease and increases the risk of stroke. This is more likely to develop in people who have high blood  pressure readings, are of African descent, or are overweight.  Have your blood pressure checked: ? Every 3-5 years if you are 49-7 years of age. ? Every year if you are 27 years old or older. Diabetes Have regular diabetes screenings. This checks your fasting blood sugar level. Have the screening done:  Once every three years after age 3 if you are at a normal weight and have a low risk for diabetes.  More often and at a younger age if you are overweight or have a high risk for diabetes. What should I know about preventing infection? Hepatitis B If you have a higher risk for hepatitis B, you should be screened for this virus. Talk with your health care provider to find out if you are at risk for hepatitis B infection. Hepatitis C Testing is recommended for:  Everyone born from [birth year] through 1963.  Anyone with known risk factors for hepatitis C. Sexually transmitted infections (STIs)  Get screened for STIs, including gonorrhea and chlamydia, if: ? You are sexually active and are younger than 73 years of age. ? You are older than 73 years of age and your health care provider tells you that you are at risk for this type of infection. ? Your sexual activity has changed since you were last screened, and you are  at increased risk for chlamydia or gonorrhea. Ask your health care provider if you are at risk.  Ask your health care provider about whether you are at high risk for HIV. Your health care provider may recommend a prescription medicine to help prevent HIV infection. If you choose to take medicine to prevent HIV, you should first get tested for HIV. You should then be tested every 3 months for as long as you are taking the medicine. Pregnancy  If you are about to stop having your period (premenopausal) and you may become pregnant, seek counseling before you get pregnant.  Take 400 to 800 micrograms (mcg) of folic acid every day if you become pregnant.  Ask for birth control  (contraception) if you want to prevent pregnancy. Osteoporosis and menopause Osteoporosis is a disease in which the bones lose minerals and strength with aging. This can result in bone fractures. If you are 51 years old or older, or if you are at risk for osteoporosis and fractures, ask your health care provider if you should:  Be screened for bone loss.  Take a calcium or vitamin D supplement to lower your risk of fractures.  Be given hormone replacement therapy (HRT) to treat symptoms of menopause. Follow these instructions at home: Lifestyle  Do not use any products that contain nicotine or tobacco, such as cigarettes, e-cigarettes, and chewing tobacco. If you need help quitting, ask your health care provider.  Do not use street drugs.  Do not share needles.  Ask your health care provider for help if you need support or information about quitting drugs. Alcohol use  Do not drink alcohol if: ? Your health care provider tells you not to drink. ? You are pregnant, may be pregnant, or are planning to become pregnant.  If you drink alcohol: ? Limit how much you use to 0-1 drink a day. ? Limit intake if you are breastfeeding.  Be aware of how much alcohol is in your drink. In the [place] one drink equals one 12 oz bottle of beer (355 mL), one 5 oz glass of wine (148 mL), or one 1 oz glass of hard liquor (44 mL). General instructions  Schedule regular health, dental, and eye exams.  Stay current with your vaccines.  Tell your health care provider if: ? You often feel depressed. ? You have ever been abused or do not feel safe at home. Summary  Adopting a healthy lifestyle and getting preventive care are important in promoting health and wellness.  Follow your health care provider's instructions about healthy diet, exercising, and getting tested or screened for diseases.  Follow your health care provider's instructions on monitoring your cholesterol and blood pressure. This  information is not intended to replace advice given to you by your health care provider. Make sure you discuss any questions you have with your health care provider. Document Revised: 05/26/2018 Document Reviewed: 05/26/2018 Elsevier Patient Education  2021 [employer].

## 2020-09-13 NOTE — Progress Notes (Signed)
Subjective:  Patient ID: Alicia Jordan, female    DOB: [birth year]  Age: 75 y.o. MRN: [id number]  Chief Complaint  Patient presents with  . Hyperlipidemia  . Hypertension  . Diabetes    HPI Type 2 diabetes mellitus with nephropathy.  Xigduo xr 10/998 mg 1 daily.Checking once daily. Sugars 160-180s. Awaiting pt assistance for xigduo xr ordered 10/998 mg 2 pills daily in am. Eats healthy foods, but does not eat enough. Lost appetite due to nerves and recent covid 19 infection. Some stress going on between she and her husband.   Essential hypertension with stage 3 ckd. Losartan-HCTZ 100/25 mg once daily.  GERD without esophagitis Protonix 40 mg one twice daily.  Hyperlipidemia On zocor 40 mg once daily.  Eats healthy, but poor appetite.   Current Outpatient Medications on File Prior to Visit  Medication Sig Dispense Refill  . FLUoxetine (PROZAC) 20 MG capsule TAKE 3 CAPSULES BY MOUTH ONCE DAILY 90 capsule 2  . Acetaminophen (TYLENOL PO) Take by mouth as needed.    . calcium-vitamin D (OSCAL WITH D) 500-200 MG-UNIT tablet Take 1 tablet by mouth.    . Cetirizine HCl (ZYRTEC ALLERGY) 10 MG CAPS Take by mouth daily.    . Dapagliflozin-metFORMIN HCl ER (XIGDUO XR) 10-998 MG TB24 Take 2 tablets by mouth daily. Received from Patient Assistance.    . diphenhydrAMINE (BENADRYL) 25 mg capsule Take 25 mg by mouth every 6 (six) hours as needed.    Marland Kitchen losartan-hydrochlorothiazide (HYZAAR) 100-25 MG tablet Take 1 tablet by mouth daily. 90 tablet 1  . pantoprazole (PROTONIX) 40 MG tablet Take 1 tablet (40 mg total) by mouth 2 (two) times daily. 180 tablet 1  . simvastatin (ZOCOR) 40 MG tablet TAKE 1 TABLET BY MOUTH AT BEDTIME 90 tablet 0   Current Facility-Administered Medications on File Prior to Visit  Medication Dose Route Frequency Provider Last Rate Last Admin  . triamcinolone acetonide (KENALOG-40) injection 80 mg  80 mg Intra-articular Once [name], MD       Past Medical  History:  Diagnosis Date  . Allergy   . Anxiety   . Arthralgia of left temporomandibular joint   . Arthritis   . Cervicalgia   . Chronic kidney disease, stage 3a ([place])   . Depression   . Diabetes mellitus without complication ([place])   . Hesitancy of micturition   . Hyperlipidemia   . Hypertension   . Osteoporosis   . Other psoriasis   . Other specified menopausal and perimenopausal disorders   . Overweight   . Repeated falls   . Residual hemorrhoidal skin tags    Past Surgical History:  Procedure Laterality Date  . ABDOMINAL HYSTERECTOMY    . APPENDECTOMY    . CARPAL TUNNEL RELEASE Right   . CHOLECYSTECTOMY    . COLONOSCOPY    . eye lid surgery    . KNEE SURGERY Left     Family History  Problem Relation Age of Onset  . Colon cancer Daughter   . Cancer Daughter   . Diabetes type II Mother   . Hyperlipidemia Mother   . Hypertension Mother   . Heart attack Father   . Hyperlipidemia Father   . Hypertension Father   . Stroke Father   . Diabetes type II Father   . Esophageal cancer Neg Hx   . Rectal cancer Neg Hx   . Stomach cancer Neg Hx    Social History   Socioeconomic History  . Marital status:  Married    Spouse name: Joneen Boers  . Number of children: 4  . Years of education: Not on file  . Highest education level: Not on file  Occupational History  . Occupation: Homemaker  Tobacco Use  . Smoking status: Never Smoker  . Smokeless tobacco: Never Used  Vaping Use  . Vaping Use: Never used  Substance and Sexual Activity  . Alcohol use: Never  . Drug use: Never  . Sexual activity: Not on file  Other Topics Concern  . Not on file  Social History Narrative   Nahal spends a lot if time helping her daughter get to and from oncology appointments   Her husband has started drinking alcohol again   Social Determinants of Health   Financial Resource Strain: Not on file  Food Insecurity: No Food Insecurity  . Worried About Charity fundraiser in the Last Year:  Never true  . Ran Out of Food in the Last Year: Never true  Transportation Needs: No Transportation Needs  . Lack of Transportation (Medical): No  . Lack of Transportation (Non-Medical): No  Physical Activity: Not on file  Stress: Stress Concern Present  . Feeling of Stress : To some extent  Social Connections: Not on file    Review of Systems  Constitutional: Negative for chills, fatigue and fever.  HENT: Negative for congestion, ear pain, rhinorrhea and sore throat.   Respiratory: Positive for cough. Negative for shortness of breath.   Cardiovascular: Negative for chest pain.  Gastrointestinal: Negative for abdominal pain, constipation, diarrhea, nausea and vomiting.  Genitourinary: Negative for dysuria and urgency.  Musculoskeletal: Positive for arthralgias (right shoulder pain ). Negative for back pain and myalgias.  Neurological: Positive for headaches. Negative for dizziness, weakness and light-headedness.  Psychiatric/Behavioral: Positive for dysphoric mood. The patient is nervous/anxious.      Objective:  BP 116/60   Pulse 72   Temp (!) 97.3 F (36.3 C)   Resp 16   Ht 5\' 2"  (1.575 m)   Wt 135 lb (61.2 kg)   BMI 24.69 kg/m   BP/Weight 09/14/2020 09/06/2020 25/85/2778  Systolic BP 242 353 614  Diastolic BP 60 64 58  Wt. (Lbs) 135 139.2 142.8  BMI 24.69 25.46 26.12    Physical Exam Vitals reviewed.  Constitutional:      Appearance: Normal appearance. She is normal weight.  Neck:     Vascular: No carotid bruit.  Cardiovascular:     Rate and Rhythm: Normal rate and regular rhythm.     Pulses: Normal pulses.     Heart sounds: Normal heart sounds.  Pulmonary:     Effort: Pulmonary effort is normal. No respiratory distress.     Breath sounds: Normal breath sounds.  Abdominal:     General: Abdomen is flat. Bowel sounds are normal.     Palpations: Abdomen is soft.     Tenderness: There is no abdominal tenderness.  Neurological:     Mental Status: She is alert  and oriented to person, place, and time.  Psychiatric:        Mood and Affect: Mood normal.        Behavior: Behavior normal.    Diabetic Foot Exam - Simple   Simple Foot Form Diabetic Foot exam was performed with the following findings: Yes 09/14/2020  9:11 PM  Visual Inspection No deformities, no ulcerations, no other skin breakdown bilaterally: Yes Sensation Testing Intact to touch and monofilament testing bilaterally: Yes Pulse Check Posterior Tibialis and Dorsalis pulse  intact bilaterally: Yes Comments      Lab Results  Component Value Date   WBC 9.1 09/14/2020   HGB 12.1 09/14/2020   HCT 39.0 09/14/2020   PLT 268 09/14/2020   GLUCOSE 192 (H) 09/14/2020   CHOL 169 09/14/2020   TRIG 168 (H) 09/14/2020   HDL 53 09/14/2020   LDLCALC 87 09/14/2020   ALT 11 09/14/2020   AST 11 09/14/2020   NA 141 09/14/2020   K 3.9 09/14/2020   CL 102 09/14/2020   CREATININE 1.85 (H) 09/14/2020   BUN 30 (H) 09/14/2020   CO2 20 09/14/2020   TSH 3.590 05/04/2020   INR 1.0 05/04/2020   HGBA1C 7.8 (H) 09/14/2020   MICROALBUR 30 05/04/2020      Assessment & Plan:  1. Type 2 diabetes mellitus with nephropathy (HCC) Control: not at goal Recommend check sugars fasting daily. Recommend check feet daily. Recommend annual eye exams. Medicines: recommend take extra metformin with xigduo 10/500 mg once daily (as this is only sample we have.) Pt was to call back and discuss metformin dosing with Sherre Poot, PHD as she was not sure what she had at home. In light of her worsening kidney dysfunction which is likely due to dehydration, I would hold her to metformin 1000 mg total daily. Continue farxiga (in xigduo) 10 mg daily.  Continue to work on eating a healthy diet and exercise.  Labs drawn today.   - Lipid panel - Hemoglobin A1c - CBC with Differential/Platelet  2. Hypertensive kidney disease with stage 3b chronic kidney disease ([place]) Hypertension is well controlled.  Kidney disease  is worsening. Likely secondary to dehydration, however would recommend change losartan/hctz to losartan 100 mg once daily. - Comprehensive metabolic panel  3. GERD without esophagitis The current medical regimen is effective;  continue present plan and medications.  4. Mixed hyperlipidemia Fairly well controlled.  On highest dose of zocor. Consider addition of lovaza at next lab check  5. Moderate recurrent major depression ([place])  Pt prefers to remain at prozac 20 mg 3 capsules daily. If she changes her mind, we can increase her prozac.  Discussed independence as a woman even with in her marriage. Offered individual or couples counseling. She defer thought to ask red at this time.   Orders Placed This Encounter  Procedures  . Lipid panel  . Hemoglobin A1c  . Comprehensive metabolic panel  . CBC with Differential/Platelet  . Cardiovascular Risk Assessment     Follow-up: Return in about 3 months (around 12/14/2020) for fasting.  An After Visit Summary was printed and given to the patient.  [name], MD [facility] [phone]

## 2020-09-14 ENCOUNTER — Other Ambulatory Visit: Payer: Self-pay

## 2020-09-14 ENCOUNTER — Encounter: Payer: Self-pay | Admitting: Family Medicine

## 2020-09-14 ENCOUNTER — Ambulatory Visit (INDEPENDENT_AMBULATORY_CARE_PROVIDER_SITE_OTHER): Payer: PPO | Admitting: Family Medicine

## 2020-09-14 VITALS — BP 116/60 | HR 72 | Temp 97.3°F | Resp 16 | Ht 62.0 in | Wt 135.0 lb

## 2020-09-14 DIAGNOSIS — E782 Mixed hyperlipidemia: Secondary | ICD-10-CM

## 2020-09-14 DIAGNOSIS — E1169 Type 2 diabetes mellitus with other specified complication: Secondary | ICD-10-CM | POA: Diagnosis not present

## 2020-09-14 DIAGNOSIS — E1121 Type 2 diabetes mellitus with diabetic nephropathy: Secondary | ICD-10-CM

## 2020-09-14 DIAGNOSIS — I129 Hypertensive chronic kidney disease with stage 1 through stage 4 chronic kidney disease, or unspecified chronic kidney disease: Secondary | ICD-10-CM | POA: Diagnosis not present

## 2020-09-14 DIAGNOSIS — N1832 Chronic kidney disease, stage 3b: Secondary | ICD-10-CM | POA: Diagnosis not present

## 2020-09-14 DIAGNOSIS — I10 Essential (primary) hypertension: Secondary | ICD-10-CM | POA: Diagnosis not present

## 2020-09-14 DIAGNOSIS — F331 Major depressive disorder, recurrent, moderate: Secondary | ICD-10-CM

## 2020-09-14 DIAGNOSIS — E785 Hyperlipidemia, unspecified: Secondary | ICD-10-CM | POA: Diagnosis not present

## 2020-09-14 DIAGNOSIS — K219 Gastro-esophageal reflux disease without esophagitis: Secondary | ICD-10-CM

## 2020-09-15 DIAGNOSIS — F331 Major depressive disorder, recurrent, moderate: Secondary | ICD-10-CM | POA: Insufficient documentation

## 2020-09-15 DIAGNOSIS — I129 Hypertensive chronic kidney disease with stage 1 through stage 4 chronic kidney disease, or unspecified chronic kidney disease: Secondary | ICD-10-CM | POA: Insufficient documentation

## 2020-09-15 DIAGNOSIS — N1832 Chronic kidney disease, stage 3b: Secondary | ICD-10-CM | POA: Insufficient documentation

## 2020-09-15 LAB — COMPREHENSIVE METABOLIC PANEL
ALT: 11 IU/L (ref 0–32)
AST: 11 IU/L (ref 0–40)
Albumin/Globulin Ratio: 1.9 (ref 1.2–2.2)
Albumin: 4.9 g/dL — ABNORMAL HIGH (ref 3.7–4.7)
Alkaline Phosphatase: 71 IU/L (ref 44–121)
BUN/Creatinine Ratio: 16 (ref 12–28)
BUN: 30 mg/dL — ABNORMAL HIGH (ref 8–27)
Bilirubin Total: 0.9 mg/dL (ref 0.0–1.2)
CO2: 20 mmol/L (ref 20–29)
Calcium: 10.1 mg/dL (ref 8.7–10.3)
Chloride: 102 mmol/L (ref 96–106)
Creatinine, Ser: 1.85 mg/dL — ABNORMAL HIGH (ref 0.57–1.00)
Globulin, Total: 2.6 g/dL (ref 1.5–4.5)
Glucose: 192 mg/dL — ABNORMAL HIGH (ref 65–99)
Potassium: 3.9 mmol/L (ref 3.5–5.2)
Sodium: 141 mmol/L (ref 134–144)
Total Protein: 7.5 g/dL (ref 6.0–8.5)
eGFR: 29 mL/min/{1.73_m2} — ABNORMAL LOW (ref >59)

## 2020-09-15 LAB — CBC WITH DIFFERENTIAL/PLATELET
Basophils Absolute: 0 10*3/uL (ref 0.0–0.2)
Basos: 0 %
EOS (ABSOLUTE): 0.2 10*3/uL (ref 0.0–0.4)
Eos: 2 %
Hematocrit: 39 % (ref 34.0–46.6)
Hemoglobin: 12.1 g/dL (ref 11.1–15.9)
Immature Grans (Abs): 0 10*3/uL (ref 0.0–0.1)
Immature Granulocytes: 0 %
Lymphocytes Absolute: 2.1 10*3/uL (ref 0.7–3.1)
Lymphs: 23 %
MCH: 25.4 pg — ABNORMAL LOW (ref 26.6–33.0)
MCHC: 31 g/dL — ABNORMAL LOW (ref 31.5–35.7)
MCV: 82 fL (ref 79–97)
Monocytes Absolute: 0.5 10*3/uL (ref 0.1–0.9)
Monocytes: 5 %
Neutrophils Absolute: 6.3 10*3/uL (ref 1.4–7.0)
Neutrophils: 70 %
Platelets: 268 10*3/uL (ref 150–450)
RBC: 4.77 x10E6/uL (ref 3.77–5.28)
RDW: 13.6 % (ref 11.7–15.4)
WBC: 9.1 10*3/uL (ref 3.4–10.8)

## 2020-09-15 LAB — LIPID PANEL
Chol/HDL Ratio: 3.2 ratio (ref 0.0–4.4)
Cholesterol, Total: 169 mg/dL (ref 100–199)
HDL: 53 mg/dL (ref >39)
LDL Chol Calc (NIH): 87 mg/dL (ref 0–99)
Triglycerides: 168 mg/dL — ABNORMAL HIGH (ref 0–149)
VLDL Cholesterol Cal: 29 mg/dL (ref 5–40)

## 2020-09-15 LAB — CARDIOVASCULAR RISK ASSESSMENT

## 2020-09-15 LAB — HEMOGLOBIN A1C
Est. average glucose Bld gHb Est-mCnc: 177 mg/dL
Hgb A1c MFr Bld: 7.8 % — ABNORMAL HIGH (ref 4.8–5.6)

## 2020-09-17 ENCOUNTER — Other Ambulatory Visit: Payer: Self-pay

## 2020-09-17 MED ORDER — LOSARTAN POTASSIUM 100 MG PO TABS: 100.0000 mg | ORAL_TABLET | Freq: Every day | ORAL | 0 refills | Status: DC

## 2020-09-17 MED ORDER — METFORMIN HCL 500 MG PO TABS: 500.0000 mg | ORAL_TABLET | Freq: Every day | ORAL | 0 refills | Status: DC

## 2020-09-17 NOTE — Progress Notes (Signed)
Patient reported verbal understanding of lab results. Patient repeating CMP 04/08. She will begin drinking more water. Requested rx for losartan and metformin 500 mg daily be sent to [facility].

## 2020-09-20 ENCOUNTER — Other Ambulatory Visit: Payer: Self-pay | Admitting: Physician Assistant

## 2020-09-21 ENCOUNTER — Ambulatory Visit: Payer: PPO

## 2020-09-21 DIAGNOSIS — R799 Abnormal finding of blood chemistry, unspecified: Secondary | ICD-10-CM | POA: Diagnosis not present

## 2020-09-21 LAB — COMPREHENSIVE METABOLIC PANEL
ALT: 7 IU/L (ref 0–32)
AST: 12 IU/L (ref 0–40)
Albumin/Globulin Ratio: 2.2 (ref 1.2–2.2)
Albumin: 4.4 g/dL (ref 3.7–4.7)
Alkaline Phosphatase: 59 IU/L (ref 44–121)
BUN/Creatinine Ratio: 12 (ref 12–28)
BUN: 17 mg/dL (ref 8–27)
Bilirubin Total: 0.7 mg/dL (ref 0.0–1.2)
CO2: 21 mmol/L (ref 20–29)
Calcium: 9.5 mg/dL (ref 8.7–10.3)
Chloride: 108 mmol/L — ABNORMAL HIGH (ref 96–106)
Creatinine, Ser: 1.41 mg/dL — ABNORMAL HIGH (ref 0.57–1.00)
Globulin, Total: 2 g/dL (ref 1.5–4.5)
Glucose: 142 mg/dL — ABNORMAL HIGH (ref 65–99)
Potassium: 4.4 mmol/L (ref 3.5–5.2)
Sodium: 144 mmol/L (ref 134–144)
Total Protein: 6.4 g/dL (ref 6.0–8.5)
eGFR: 40 mL/min/{1.73_m2} — ABNORMAL LOW (ref >59)

## 2020-09-25 ENCOUNTER — Other Ambulatory Visit: Payer: Self-pay

## 2020-09-25 DIAGNOSIS — R799 Abnormal finding of blood chemistry, unspecified: Secondary | ICD-10-CM

## 2020-10-04 NOTE — Progress Notes (Signed)
Patient was provided sample of Xigduo 10/500 mg in office to be taken daily. Dr. Tobie Poet provided rx for metformin 500 mg daily until [place] and [place] shipment arrives. Patient verbalized understanding.

## 2020-10-09 ENCOUNTER — Other Ambulatory Visit: Payer: PPO

## 2020-10-09 DIAGNOSIS — R799 Abnormal finding of blood chemistry, unspecified: Secondary | ICD-10-CM

## 2020-10-09 LAB — COMPREHENSIVE METABOLIC PANEL
ALT: 6 IU/L (ref 0–32)
AST: 9 IU/L (ref 0–40)
Albumin/Globulin Ratio: 2.3 — ABNORMAL HIGH (ref 1.2–2.2)
Albumin: 4.6 g/dL (ref 3.7–4.7)
Alkaline Phosphatase: 57 IU/L (ref 44–121)
BUN/Creatinine Ratio: 18 (ref 12–28)
BUN: 27 mg/dL (ref 8–27)
Bilirubin Total: 0.7 mg/dL (ref 0.0–1.2)
CO2: 20 mmol/L (ref 20–29)
Calcium: 9.4 mg/dL (ref 8.7–10.3)
Chloride: 107 mmol/L — ABNORMAL HIGH (ref 96–106)
Creatinine, Ser: 1.46 mg/dL — ABNORMAL HIGH (ref 0.57–1.00)
Globulin, Total: 2 g/dL (ref 1.5–4.5)
Glucose: 133 mg/dL — ABNORMAL HIGH (ref 65–99)
Potassium: 4.9 mmol/L (ref 3.5–5.2)
Sodium: 142 mmol/L (ref 134–144)
Total Protein: 6.6 g/dL (ref 6.0–8.5)
eGFR: 38 mL/min/{1.73_m2} — ABNORMAL LOW (ref >59)

## 2020-10-15 ENCOUNTER — Other Ambulatory Visit: Payer: Self-pay

## 2020-10-15 DIAGNOSIS — I129 Hypertensive chronic kidney disease with stage 1 through stage 4 chronic kidney disease, or unspecified chronic kidney disease: Secondary | ICD-10-CM

## 2020-10-17 ENCOUNTER — Other Ambulatory Visit: Payer: Self-pay | Admitting: Physician Assistant

## 2020-10-17 ENCOUNTER — Other Ambulatory Visit: Payer: Self-pay | Admitting: Family Medicine

## 2020-10-25 ENCOUNTER — Other Ambulatory Visit: Payer: Self-pay | Admitting: Family Medicine

## 2020-10-25 DIAGNOSIS — I129 Hypertensive chronic kidney disease with stage 1 through stage 4 chronic kidney disease, or unspecified chronic kidney disease: Secondary | ICD-10-CM

## 2020-10-25 DIAGNOSIS — N1832 Chronic kidney disease, stage 3b: Secondary | ICD-10-CM

## 2020-11-02 ENCOUNTER — Ambulatory Visit
Admission: RE | Admit: 2020-11-02 | Discharge: 2020-11-02 | Disposition: A | Discharge status: Home / Self Care | Payer: PPO | Source: Ambulatory Visit | Attending: Family Medicine | Admitting: Family Medicine

## 2020-11-02 DIAGNOSIS — N189 Chronic kidney disease, unspecified: Secondary | ICD-10-CM | POA: Diagnosis not present

## 2020-11-02 DIAGNOSIS — N1832 Chronic kidney disease, stage 3b: Secondary | ICD-10-CM

## 2020-11-02 DIAGNOSIS — I129 Hypertensive chronic kidney disease with stage 1 through stage 4 chronic kidney disease, or unspecified chronic kidney disease: Secondary | ICD-10-CM | POA: Diagnosis not present

## 2020-11-13 ENCOUNTER — Ambulatory Visit (INDEPENDENT_AMBULATORY_CARE_PROVIDER_SITE_OTHER): Payer: PPO

## 2020-11-13 DIAGNOSIS — E782 Mixed hyperlipidemia: Secondary | ICD-10-CM

## 2020-11-13 DIAGNOSIS — E1169 Type 2 diabetes mellitus with other specified complication: Secondary | ICD-10-CM

## 2020-11-13 DIAGNOSIS — E785 Hyperlipidemia, unspecified: Secondary | ICD-10-CM

## 2020-11-13 DIAGNOSIS — E1121 Type 2 diabetes mellitus with diabetic nephropathy: Secondary | ICD-10-CM | POA: Diagnosis not present

## 2020-11-13 MED ORDER — XIGDUO XR 5-500 MG PO TB24: 2.0000 | ORAL_TABLET | Freq: Every day | ORAL | 2 refills | Status: DC

## 2020-11-13 NOTE — Addendum Note (Signed)
Addended by: Donette Larry B on: 11/13/2020 03:03 PM   Modules accepted: Orders

## 2020-11-13 NOTE — Progress Notes (Signed)
Chronic Care Management Pharmacy Note  11/13/2020 Name:  Alicia Jordan MRN:  [id number] DOB:  05/26/1946   Plan Updates:   Pharmacist reviewed home blood sugar readings. Patient is almost out of Xigduo Xr 5/500 2 tablet daily prescription (10 days left). Pharmacist submitted new prescription to [employer] for [place] [facility] [place] patient assistance. Pharmacist will also call to coordinate refill with [place] and [place] program.  Due to patient's renal function metformin dosing is limited to 1000 mg daily. Patient reports blood sugar is in the 140s.   Subjective: Alicia Jordan is an 75 y.o. year old female who is a primary patient of Cox, Kirsten, MD.  The CCM team was consulted for assistance with disease management and care coordination needs.    Engaged with patient face to face for follow up visit in response to provider referral for pharmacy case management and/or care coordination services.   Consent to Services:  The patient was given information about Chronic Care Management services, agreed to services, and gave verbal consent prior to initiation of services.  Please see initial visit note for detailed documentation.   Patient Care Team: [name], MD as PCP - General (Family Medicine) Jackquline Denmark, MD as Consulting Physician (Gastroenterology) Joya Salm, MD as Referring Physician (Orthopedic Surgery) Burnice Logan, [facility] as Pharmacist (Pharmacist)   Hospital visits: None in previous 6 months   Objective:  Lab Results  Component Value Date   CREATININE 1.46 (H) 10/09/2020   BUN 27 10/09/2020   GFRNONAA 41 (L) 05/04/2020   GFRAA 47 (L) 05/04/2020   NA 142 10/09/2020   K 4.9 10/09/2020   CALCIUM 9.4 10/09/2020   CO2 20 10/09/2020   GLUCOSE 133 (H) 10/09/2020    Lab Results  Component Value Date/Time   HGBA1C 7.8 (H) 09/14/2020 09:43 AM   HGBA1C 7.2 (H) 05/04/2020 09:35 AM   MICROALBUR 30 05/04/2020 09:13 AM   MICROALBUR 80 08/11/2019 09:50 AM    Last diabetic Eye  exam:  Lab Results  Component Value Date/Time   HMDIABEYEEXA No Retinopathy 02/09/2020 12:00 AM    Last diabetic Foot exam: No results found for: HMDIABFOOTEX   Lab Results  Component Value Date   CHOL 169 09/14/2020   HDL 53 09/14/2020   LDLCALC 87 09/14/2020   TRIG 168 (H) 09/14/2020   CHOLHDL 3.2 09/14/2020    Hepatic Function Latest Ref Rng & Units 10/09/2020 09/21/2020 09/14/2020  Total Protein 6.0 - 8.5 g/dL 6.6 6.4 7.5  Albumin 3.7 - 4.7 g/dL 4.6 4.4 4.9(H)  AST 0 - 40 IU/L $Remov'9 12 11  'leCuTG$ ALT 0 - 32 IU/L $Remov'6 7 11  'QYLGNK$ Alk Phosphatase 44 - 121 IU/L 57 59 71  Total Bilirubin 0.0 - 1.2 mg/dL 0.7 0.7 0.9    Lab Results  Component Value Date/Time   TSH 3.590 05/04/2020 09:35 AM   TSH 3.170 08/11/2019 09:33 AM    CBC Latest Ref Rng & Units 09/14/2020 05/04/2020 11/09/2019  WBC 3.4 - 10.8 x10E3/uL 9.1 6.8 6.0  Hemoglobin 11.1 - 15.9 g/dL 12.1 11.4 10.9(L)  Hematocrit 34.0 - 46.6 % 39.0 35.7 35.2  Platelets 150 - 450 x10E3/uL 268 215 174    No results found for: VD25OH  Clinical ASCVD: Yes  The 10-year ASCVD risk score Mikey Bussing DC Jr., et al., 2013) is: 25.3%   Values used to calculate the score:     Age: 51 years     Sex: Female     Is Non-Hispanic African American:  No     Diabetic: Yes     Tobacco smoker: No     Systolic Blood Pressure: 945 mmHg     Is BP treated: Yes     HDL Cholesterol: 53 mg/dL     Total Cholesterol: 169 mg/dL    Depression screen [facility] 2/9 09/14/2020 09/06/2020 05/04/2020  Decreased Interest 2 0 0  Down, Depressed, Hopeless 2 0 0  PHQ - 2 Score 4 0 0  Altered sleeping 2 - -  Tired, decreased energy 2 - -  Change in appetite 2 - -  Feeling bad or failure about yourself  2 - -  Trouble concentrating 2 - -  Moving slowly or fidgety/restless 0 - -  Suicidal thoughts 0 - -  PHQ-9 Score 14 - -  Difficult doing work/chores - - -     Other: (CHADS2VASc if Afib, MMRC or CAT for COPD, ACT, DEXA)  Social History   Tobacco Use  Smoking Status Never Smoker   Smokeless Tobacco Never Used   BP Readings from Last 3 Encounters:  09/14/20 116/60  09/06/20 118/64  05/04/20 (!) 116/58   Pulse Readings from Last 3 Encounters:  09/14/20 72  09/06/20 74  08/07/20 87   Wt Readings from Last 3 Encounters:  09/14/20 135 lb (61.2 kg)  09/06/20 139 lb 3.2 oz (63.1 kg)  05/04/20 142 lb 12.8 oz (64.8 kg)   BMI Readings from Last 3 Encounters:  09/14/20 24.69 kg/m  09/06/20 25.46 kg/m  05/04/20 26.12 kg/m    Assessment/Interventions: Review of patient past medical history, allergies, medications, health status, including review of consultants reports, laboratory and other test data, was performed as part of comprehensive evaluation and provision of chronic care management services.   SDOH:  (Social Determinants of Health) assessments and interventions performed: Yes  SDOH Screenings   Alcohol Screen: Not on file  Depression (PHQ2-9): Medium Risk  . PHQ-2 Score: 14  Financial Resource Strain: Not on file  Food Insecurity: No Food Insecurity  . Worried About Charity fundraiser in the Last Year: Never true  . Ran Out of Food in the Last Year: Never true  Housing: Low Risk   . Last Housing Risk Score: 0  Physical Activity: Not on file  Social Connections: Not on file  Stress: Stress Concern Present  . Feeling of Stress : To some extent  Tobacco Use: Low Risk   . Smoking Tobacco Use: Never Smoker  . Smokeless Tobacco Use: Never Used  Transportation Needs: No Transportation Needs  . Lack of Transportation (Medical): No  . Lack of Transportation (Non-Medical): No    CCM Care Plan  Allergies  Allergen Reactions  . Meloxicam     Medications Reviewed Today    Reviewed by [name], MD (Physician) on 09/14/20 at [place] List Status: <None>  Medication Order Taking? Sig Documenting Provider Last Dose Status Informant  Acetaminophen (TYLENOL PO) [id number]  Take by mouth as needed. [provider]  Active    calcium-vitamin D (OSCAL WITH D) 500-200 MG-UNIT tablet [id number]  Take 1 tablet by mouth. [provider]  Active   Cetirizine HCl (ZYRTEC ALLERGY) 10 MG CAPS [id number]  Take by mouth daily. [provider]  Active   Dapagliflozin-metFORMIN HCl ER (XIGDUO XR) 10-998 MG TB24 [id number]  Take 2 tablets by mouth daily. Received from Patient Assistance. [provider]  Active   diphenhydrAMINE (BENADRYL) 25 mg capsule [id number]  Take 25 mg by mouth every 6 (  six) hours as needed. [provider]  Active   FLUoxetine (PROZAC) 20 MG capsule [id number] Yes TAKE 3 CAPSULES BY MOUTH ONCE DAILY Cox, Kirsten, MD Taking Active   losartan-hydrochlorothiazide (HYZAAR) 100-25 MG tablet [id number]  Take 1 tablet by mouth daily. Cox, Kirsten, MD  Active   pantoprazole (PROTONIX) 40 MG tablet [id number]  Take 1 tablet (40 mg total) by mouth 2 (two) times daily. [name], MD  Active   simvastatin (ZOCOR) 40 MG tablet [id number]  TAKE 1 TABLET BY MOUTH AT BEDTIME Marge Duncans, PA-C  Active   triamcinolone acetonide [facility]) injection 80 mg [id number]   [name], MD  Active           Patient Active Problem List   Diagnosis Date Noted  . Moderate recurrent major depression ([place]) 09/15/2020  . Hypertensive kidney disease with stage 3b chronic kidney disease ([place]) 09/15/2020  . Chronic pain of both knees 04/17/2020  . Pedal edema 01/20/2020  . Chronic right shoulder pain 08/21/2019  . Dyslipidemia associated with type 2 diabetes mellitus ([place]) 08/11/2019  . Essential hypertension, benign 08/11/2019  . Mixed hyperlipidemia 08/11/2019  . GERD without esophagitis 04/18/2009  . DYSPHAGIA 04/18/2009  . ABDOMINAL PAIN-EPIGASTRIC 04/18/2009    Immunization History  Administered Date(s) Administered  . Fluad Quad(high Dose 65+) 02/08/2019  . Influenza-Unspecified 02/27/2020  . Moderna Sars-Covid-2 Vaccination 10/31/2019, 11/28/2019, 05/31/2020, 10/12/2020  . Pneumococcal  Conjugate-13 05/13/2014  . Pneumococcal Polysaccharide-23 01/29/2017  . Tdap 05/03/2012  . Zoster Recombinat (Shingrix) 06/11/2017  . Zoster, Live 05/16/2010    Conditions to be addressed/monitored:  Hypertension, Hyperlipidemia and Diabetes  Care Plan : [place]  Updates made by Burnice Logan, [place] since 11/13/2020 12:00 AM    Problem: htn, hld, dm   Priority: High  Onset Date: 11/13/2020    Long-Range Goal: Disease State Management   Start Date: 11/13/2020  Expected End Date: 11/13/2021  This Visit's Progress: On track  Priority: High  Note:    Current Barriers:  . Unable to independently afford treatment regimen  Pharmacist Clinical Goal(s):  Marland Kitchen Patient will verbalize ability to afford treatment regimen through collaboration with PharmD and provider.   Interventions: . 1:1 collaboration with [name], MD regarding development and update of comprehensive plan of care as evidenced by provider attestation and co-signature . Inter-disciplinary care team collaboration (see longitudinal plan of care) . Comprehensive medication review performed; medication list updated in electronic medical record  Hypertension (BP goal <130/80) -Controlled -Current treatment: . Losartan 100 mg daily  -Medications previously tried: none reported -Current home readings: well controlled  -Current dietary habits: eats lunch out and working to [employer] -Current exercise habits: [place] -Denies hypotensive/hypertensive symptoms -Educated on BP goals and benefits of medications for prevention of heart attack, stroke and kidney damage; Daily salt intake goal < 2300 mg; Exercise goal of 150 minutes per week; -Counseled to monitor BP at home as needed, document, and provide log at future appointments -Counseled on diet and exercise extensively Recommended to continue current medication  Hyperlipidemia: (LDL goal < 70) -Not ideally  controlled -Current treatment: . Simvastatin 40 mg daily at bedtime -Medications previously tried: none reported  -Current dietary patterns: eats out at lunch. Trying to make smarter decisions -Current exercise habits: just started [place] -Educated on Cholesterol goals;  Benefits of statin for ASCVD risk reduction; Importance of limiting foods high in cholesterol; Exercise goal  of 150 minutes per week; -Counseled on diet and exercise extensively Recommended to continue current medication  Diabetes (A1c goal <7%) -Not ideally controlled -Current medications: . Xigduo XR 5/500 mg 2 tablets daily  -Medications previously tried: [name] -Current home glucose readings . fasting glucose: 140s . post prandial glucose: not checking  -Denies hypoglycemic/hyperglycemic symptoms -Current meal patterns:  . Trying to make smart choices. Eats out with daughter during lunch.  -Current exercise: [place] -Educated on A1c and blood sugar goals; Complications of diabetes including kidney damage, retinal damage, and cardiovascular disease; Exercise goal of 150 minutes per week; Benefits of weight loss; Benefits of routine self-monitoring of blood sugar; Carbohydrate counting and/or plate method -Counseled to check feet daily and get yearly eye exams -Counseled on diet and exercise extensively Recommended to continue current medication Collaborated with [place] for updated prescription sent for Xigduo XR.    Patient Goals/Self-Care Activities . Patient will:  - take medications as prescribed focus on medication adherence by using pill box check glucose daily, document, and provide at future appointments target a minimum of 150 minutes of moderate intensity exercise weekly  Follow Up Plan: Telephone follow up appointment with care management team member scheduled for: 12/2020      Medication Assistance: Xigduo obtained  through [facility] and Me medication assistance program.  Enrollment ends 06/15/2021  Compliance/Adherence/Medication fill history: Care Gaps: ED visit per thousand  Star-Rating Drugs: Xigduo - through patient assistance Losartan 100 mg daily - 09/17/2020 - 90 day supply Simvastatin 40 mg daily - 10/18/2020 - 90 day supply   Patient's preferred pharmacy is:  [facility] [address], [place] - [place] [address] [place] [address] Phone: [phone] Fax: [phone]  Uses pill box? Yes Pt endorses 100% compliance  We discussed: Benefits of medication synchronization, packaging and delivery as well as enhanced pharmacist oversight with [employer]. Patient decided to: Continue current medication management strategy  Care Plan and Follow Up Patient Decision:  Patient agrees to Care Plan and Follow-up.  Plan: Telephone follow up appointment with care management team member scheduled for:  12/24/2020

## 2020-11-13 NOTE — Patient Instructions (Addendum)
Visit Information  Goals Addressed            This Visit's Progress   . Learn More About My Health       Timeframe:  Long-Range Goal Priority:  High Start Date:                             Expected End Date:                        Follow Up Date 12/24/2020    - tell my story and reason for my visit - make a list of questions - ask questions - repeat what I heard to make sure I understand - bring a list of my medicines to the visit - speak up when I don't understand    Why is this important?    The best way to learn about your health and care is by talking to the doctor and nurse.   They will answer your questions and give you information in the way that you like best.    Notes:     Alicia Jordan Kitchen Manage My Medicine       Timeframe:  Long-Range Goal Priority:  High Start Date:                             Expected End Date:                       Follow Up Date 12/24/2020    - call for medicine refill 2 or 3 days before it runs out - keep a list of all the medicines I take; vitamins and herbals too - use a pillbox to sort medicine    Why is this important?   . These steps will help you keep on track with your medicines.   Notes:     Alicia Jordan Kitchen Monitor and Manage My Blood Sugar-Diabetes Type 2       Timeframe:  Long-Range Goal Priority:  High Start Date:                             Expected End Date:                       Follow Up Date 12/24/2020    - check blood sugar at prescribed times - check blood sugar if I feel it is too high or too low - take the blood sugar log to all doctor visits    Why is this important?    Checking your blood sugar at home helps to keep it from getting very high or very low.   Writing the results in a diary or log helps the doctor know how to care for you.   Your blood sugar log should have the time, date and the results.   Also, write down the amount of insulin or other medicine that you take.   Other information, like what you ate,  exercise done and how you were feeling, will also be helpful.     Notes:       Patient Care Plan: CCM Pharmacy Care Plan    Problem Identified: htn, hld, dm   Priority: High  Onset Date: 11/13/2020    Long-Range Goal: Disease State Management   Start Date: 11/13/2020  Expected End Date: 11/13/2021  This Visit's Progress: On track  Priority: High  Note:    Current Barriers:  . Unable to independently afford treatment regimen  Pharmacist Clinical Goal(s):  Alicia Jordan Kitchen Patient will verbalize ability to afford treatment regimen through collaboration with PharmD and provider.   Interventions: . 1:1 collaboration with Alicia Brome, MD regarding development and update of comprehensive plan of care as evidenced by provider attestation and co-signature . Inter-disciplinary care team collaboration (see longitudinal plan of care) . Comprehensive medication review performed; medication list updated in electronic medical record  Hypertension (BP goal <130/80) -Controlled -Current treatment: . Losartan 100 mg daily  -Medications previously tried: none reported -Current home readings: well controlled  -Current dietary habits: eats lunch out and working to [employer] -Current exercise habits: started [place] [employer] -Denies hypotensive/hypertensive symptoms -Educated on BP goals and benefits of medications for prevention of heart attack, stroke and kidney damage; Daily salt intake goal < 2300 mg; Exercise goal of 150 minutes per week; -Counseled to monitor BP at home as needed, document, and provide log at future appointments -Counseled on diet and exercise extensively Recommended to continue current medication  Hyperlipidemia: (LDL goal < 70) -Not ideally controlled -Current treatment: . Simvastatin 40 mg daily at bedtime -Medications previously tried: none reported  -Current dietary patterns: eats out at lunch. Trying to make smarter decisions -Current exercise habits:  just [place] [employer] [place] -Educated on Cholesterol goals;  Benefits of statin for ASCVD risk reduction; Importance of limiting foods high in cholesterol; Exercise goal of 150 minutes per week; -Counseled on diet and exercise extensively Recommended to continue current medication  Diabetes (A1c goal <7%) -Not ideally controlled -Current medications: . Xigduo XR 5/500 mg 2 tablets daily  -Medications previously tried: Alicia Jordan -Current home glucose readings . fasting glucose: 140s . post prandial glucose: not checking  -Denies hypoglycemic/hyperglycemic symptoms -Current meal patterns:  . Trying to make smart choices. Eats out with daughter during lunch.  -Current exercise: [place] [employer] [place] -Educated on A1c and blood sugar goals; Complications of diabetes including kidney damage, retinal damage, and cardiovascular disease; Exercise goal of 150 minutes per week; Benefits of weight loss; Benefits of routine self-monitoring of blood sugar; Carbohydrate counting and/or plate method -Counseled to check feet daily and get yearly eye exams -Counseled on diet and exercise extensively Recommended to continue current medication Collaborated with Alicia Jordan and Me for updated prescription sent for Xigduo XR.    Patient Goals/Self-Care Activities . Patient will:  - take medications as prescribed focus on medication adherence by using pill box check glucose daily, document, and provide at future appointments target a minimum of 150 minutes of moderate intensity exercise weekly  Follow Up Plan: Telephone follow up appointment with care management team member scheduled for: 12/2020      The patient verbalized understanding of instructions, educational materials, and care plan provided today and declined offer to receive copy of patient instructions, educational materials, and care plan.  Telephone follow up appointment with pharmacy team member  scheduled for: 12/24/2020  Alicia Jordan, [facility]  Diabetes Mellitus and Exercise Exercising regularly is important for overall health, especially for people who have diabetes mellitus. Exercising is not only about losing weight. It has many other health benefits, such as increasing muscle strength and bone density and reducing body fat and stress. This leads to improved fitness, flexibility, and endurance, all of which  result in better overall health. What are the benefits of exercise if I have diabetes? Exercise has many benefits for people with diabetes. They include:  Helping to lower and control blood sugar (glucose).  Helping the body to respond better to the hormone insulin by improving insulin sensitivity.  Reducing how much insulin the body needs.  Lowering the risk for heart disease by: ? Lowering "bad" cholesterol and triglyceride levels. ? Increasing "good" cholesterol levels. ? Lowering blood pressure. ? Lowering blood glucose levels. What is my activity plan? Your health care provider or certified diabetes educator can help you make a plan for the type and frequency of exercise that works for you. This is called your activity plan. Be sure to:  Get at least 150 minutes of medium-intensity or high-intensity exercise each week. Exercises may include brisk walking, biking, or water aerobics.  Do stretching and strengthening exercises, such as yoga or weight lifting, at least 2 times a week.  Spread out your activity over at least 3 days of the week.  Get some form of physical activity each day. ? Do not go more than 2 days in a row without some kind of physical activity. ? Avoid being inactive for more than 90 minutes at a time. Take frequent breaks to walk or stretch.  Choose exercises or activities that you enjoy. Set realistic goals.  Start slowly and gradually increase your exercise intensity over time.   How do I manage my diabetes during exercise? Monitor your blood  glucose  Check your blood glucose before and after exercising. If your blood glucose is: ? 240 mg/dL (13.3 mmol/L) or higher before you exercise, check your urine for ketones. These are chemicals created by the liver. If you have ketones in your urine, do not exercise until your blood glucose returns to normal. ? 100 mg/dL (5.6 mmol/L) or lower, eat a snack containing 15-20 grams of carbohydrate. Check your blood glucose 15 minutes after the snack to make sure that your glucose level is above 100 mg/dL (5.6 mmol/L) before you start your exercise.  Know the symptoms of low blood glucose (hypoglycemia) and how to treat it. Your risk for hypoglycemia increases during and after exercise. Follow these tips and your health care provider's instructions  Keep a carbohydrate snack that is fast-acting for use before, during, and after exercise to help prevent or treat hypoglycemia.  Avoid injecting insulin into areas of the body that are going to be exercised. For example, avoid injecting insulin into: ? Your arms, when you are about to play tennis. ? Your legs, when you are about to go jogging.  Keep records of your exercise habits. Doing this can help you and your health care provider adjust your diabetes management plan as needed. Write down: ? Food that you eat before and after you exercise. ? Blood glucose levels before and after you exercise. ? The type and amount of exercise you have done.  Work with your health care provider when you start a new exercise or activity. He or she may need to: ? Make sure that the activity is safe for you. ? Adjust your insulin, other medicines, and food that you eat.  Drink plenty of water while you exercise. This prevents loss of water (dehydration) and problems caused by a lot of heat in the body (heat stroke).   Where to find more information  American Diabetes Association: www.diabetes.org Summary  Exercising regularly is important for overall health,  especially for people who have  diabetes mellitus.  Exercising has many health benefits. It increases muscle strength and bone density and reduces body fat and stress. It also lowers and controls blood glucose.  Your health care provider or certified diabetes educator can help you make an activity plan for the type and frequency of exercise that works for you.  Work with your health care provider to make sure any new activity is safe for you. Also work with your health care provider to adjust your insulin, other medicines, and the food you eat. This information is not intended to replace advice given to you by your health care provider. Make sure you discuss any questions you have with your health care provider. Document Revised: 02/28/2019 Document Reviewed: 02/28/2019 Elsevier Patient Education  [place].

## 2020-11-15 ENCOUNTER — Telehealth: Payer: Self-pay

## 2020-11-15 NOTE — Progress Notes (Addendum)
Called [facility] for Xigduo to confirm dosage increase to 2 tabs daily. Spoke with representative who stated that since will be considered new rx that it will have to be called In on June 20th for a June 26th fill date. Current prescription is now inactive.   Marcine Matar, [place] Clinical Pharmacist Assistant

## 2020-11-16 NOTE — Progress Notes (Signed)
    Chronic Care Management Pharmacy Assistant   Name: KAMBRI DISMORE  MRN: [id number] DOB: 17-Jan-1946  . Reason for Encounter: Call to [facility] for Xigduo status  Per Sherre Poot, CPP., called [facility] and spoke with [facility]. She stated that they received new rx with dosage increase. This has been sent out to the pharmacy and will be shipped directly to the patient. She stated that it takes 10-15 days for medication to ship and is set for auto-refill.    Medications: Outpatient Encounter Medications as of 11/15/2020  Medication Sig  . Acetaminophen (TYLENOL PO) Take by mouth as needed.  . calcium-vitamin D (OSCAL WITH D) 500-200 MG-UNIT tablet Take 1 tablet by mouth.  . Cetirizine HCl (ZYRTEC ALLERGY) 10 MG CAPS Take by mouth daily.  . Dapagliflozin-metFORMIN HCl ER (XIGDUO XR) 5-500 MG TB24 Take 2 tablets by mouth daily.  . diphenhydrAMINE (BENADRYL) 25 mg capsule Take 25 mg by mouth every 6 (six) hours as needed.  Marland Kitchen FLUoxetine (PROZAC) 20 MG capsule TAKE 3 CAPSULES BY MOUTH ONCE DAILY  . losartan (COZAAR) 100 MG tablet Take 1 tablet (100 mg total) by mouth daily.  . pantoprazole (PROTONIX) 40 MG tablet Take 1 tablet by mouth twice daily  . simvastatin (ZOCOR) 40 MG tablet TAKE 1 TABLET BY MOUTH AT BEDTIME   Facility-Administered Encounter Medications as of 11/15/2020  Medication  . triamcinolone acetonide (KENALOG-40) injection 80 mg   Marcine Matar, [place] Clinical Pharmacist Assistant

## 2020-12-05 DIAGNOSIS — N1832 Chronic kidney disease, stage 3b: Secondary | ICD-10-CM | POA: Diagnosis not present

## 2020-12-05 DIAGNOSIS — N179 Acute kidney failure, unspecified: Secondary | ICD-10-CM | POA: Diagnosis not present

## 2020-12-05 DIAGNOSIS — I129 Hypertensive chronic kidney disease with stage 1 through stage 4 chronic kidney disease, or unspecified chronic kidney disease: Secondary | ICD-10-CM | POA: Diagnosis not present

## 2020-12-05 DIAGNOSIS — E1122 Type 2 diabetes mellitus with diabetic chronic kidney disease: Secondary | ICD-10-CM | POA: Diagnosis not present

## 2020-12-09 ENCOUNTER — Other Ambulatory Visit: Payer: Self-pay | Admitting: Family Medicine

## 2020-12-14 ENCOUNTER — Ambulatory Visit (INDEPENDENT_AMBULATORY_CARE_PROVIDER_SITE_OTHER): Payer: PPO | Admitting: Family Medicine

## 2020-12-14 ENCOUNTER — Encounter: Payer: Self-pay | Admitting: Family Medicine

## 2020-12-14 ENCOUNTER — Other Ambulatory Visit: Payer: Self-pay

## 2020-12-14 VITALS — BP 136/60 | HR 72 | Temp 97.5°F | Resp 16 | Ht 62.0 in | Wt 141.0 lb

## 2020-12-14 DIAGNOSIS — M8580 Other specified disorders of bone density and structure, unspecified site: Secondary | ICD-10-CM | POA: Diagnosis not present

## 2020-12-14 DIAGNOSIS — E782 Mixed hyperlipidemia: Secondary | ICD-10-CM | POA: Diagnosis not present

## 2020-12-14 DIAGNOSIS — N1832 Chronic kidney disease, stage 3b: Secondary | ICD-10-CM

## 2020-12-14 DIAGNOSIS — I129 Hypertensive chronic kidney disease with stage 1 through stage 4 chronic kidney disease, or unspecified chronic kidney disease: Secondary | ICD-10-CM

## 2020-12-14 DIAGNOSIS — Z01818 Encounter for other preprocedural examination: Secondary | ICD-10-CM | POA: Diagnosis not present

## 2020-12-14 DIAGNOSIS — E1121 Type 2 diabetes mellitus with diabetic nephropathy: Secondary | ICD-10-CM | POA: Diagnosis not present

## 2020-12-14 LAB — POCT URINALYSIS DIP (CLINITEK)
Bilirubin, UA: NEGATIVE
Blood, UA: NEGATIVE
Glucose, UA: 250 mg/dL — AB
Ketones, POC UA: NEGATIVE mg/dL
Nitrite, UA: NEGATIVE
POC PROTEIN,UA: NEGATIVE
Spec Grav, UA: 1.015 (ref 1.010–1.025)
Urobilinogen, UA: 0.2 E.U./dL
pH, UA: 5.5 (ref 5.0–8.0)

## 2020-12-14 NOTE — Progress Notes (Signed)
Subjective:  Patient ID: Alicia Jordan, female    DOB: 1945-11-12  Age: 75 y.o. MRN: [id number]  Chief Complaint  Patient presents with   surgical clearance    HPI Patient is a 75 yo female with hypertensive CKD, hyperlipidemia, diabetic glomerulopathy, gerd, and depression. Who presents for surgical clearance for right TKR by Dr. Ronnie Derby.  Diabetes with nephropathy: Sugars 140s. Checks them daily. Checks feet daily. Eats healthy. Unable to exercise. On xigduo xr 5/500 mg 2 tablets daily.  Hypertensive CKD: Bp is well controlled. On losartan 100 mg once daily.  Hyperlipidemia: On zocor 40 mg once daily.  Depression: on prozac 20 mg daily. Feeling well.  GERD: on pantoprazole 40 mg once daily.   Current Outpatient Medications on File Prior to Visit  Medication Sig Dispense Refill   Acetaminophen (TYLENOL PO) Take by mouth as needed.     calcium-vitamin D (OSCAL WITH D) 500-200 MG-UNIT tablet Take 1 tablet by mouth.     Cetirizine HCl (ZYRTEC ALLERGY) 10 MG CAPS Take by mouth daily.     Dapagliflozin-metFORMIN HCl ER (XIGDUO XR) 5-500 MG TB24 Take 2 tablets by mouth daily. 180 tablet 2   diphenhydrAMINE (BENADRYL) 25 mg capsule Take 25 mg by mouth every 6 (six) hours as needed.     FLUoxetine (PROZAC) 20 MG capsule TAKE 3 CAPSULES BY MOUTH ONCE DAILY 270 capsule 0   losartan (COZAAR) 100 MG tablet Take 1 tablet by mouth once daily 90 tablet 0   pantoprazole (PROTONIX) 40 MG tablet Take 1 tablet by mouth twice daily 180 tablet 3   simvastatin (ZOCOR) 40 MG tablet TAKE 1 TABLET BY MOUTH AT BEDTIME 90 tablet 0   Current Facility-Administered Medications on File Prior to Visit  Medication Dose Route Frequency Provider Last Rate Last Admin   triamcinolone acetonide (KENALOG-40) injection 80 mg  80 mg Intra-articular Once Susa Bones, Elnita Maxwell, MD       Past Medical History:  Diagnosis Date   Allergy    Anxiety    Arthralgia of left temporomandibular joint    Arthritis    Cervicalgia     Chronic kidney disease, stage 3a ([place])    Depression    Diabetes mellitus without complication ([place])    Hesitancy of micturition    Hyperlipidemia    Hypertension    Osteoporosis    Other psoriasis    Other specified menopausal and perimenopausal disorders    Overweight    Repeated falls    Residual hemorrhoidal skin tags    Past Surgical History:  Procedure Laterality Date   ABDOMINAL HYSTERECTOMY     APPENDECTOMY     CARPAL TUNNEL RELEASE Right    CHOLECYSTECTOMY     COLONOSCOPY     eye lid surgery     KNEE SURGERY Left     Family History  Problem Relation Age of Onset   Colon cancer Daughter    Cancer Daughter    Diabetes type II Mother    Hyperlipidemia Mother    Hypertension Mother    Heart attack Father    Hyperlipidemia Father    Hypertension Father    Stroke Father    Diabetes type II Father    Esophageal cancer Neg Hx    Rectal cancer Neg Hx    Stomach cancer Neg Hx    Social History   Socioeconomic History   Marital status: Married    Spouse name: Joneen Boers   Number of children: 4   Years of education:  Not on file   Highest education level: Not on file  Occupational History   Occupation: Homemaker  Tobacco Use   Smoking status: Never   Smokeless tobacco: Never  Vaping Use   Vaping Use: Never used  Substance and Sexual Activity   Alcohol use: Never   Drug use: Never   Sexual activity: Not on file  Other Topics Concern   Not on file  Social History Narrative   Radley spends a lot if time helping her daughter get to and from oncology appointments   Her husband has started drinking alcohol again   Social Determinants of Health   Financial Resource Strain: Not on file  Food Insecurity: No Food Insecurity   Worried About Charity fundraiser in the Last Year: Never true   Arboriculturist in the Last Year: Never true  Transportation Needs: No Transportation Needs   Lack of Transportation (Medical): No   Lack of Transportation (Non-Medical): No   Physical Activity: Not on file  Stress: Stress Concern Present   Feeling of Stress : To some extent  Social Connections: Not on file    Review of Systems  Constitutional:  Positive for fatigue. Negative for chills and fever.  HENT:  Negative for congestion, ear pain and sore throat.   Respiratory:  Negative for cough and shortness of breath.   Cardiovascular:  Negative for chest pain.  Gastrointestinal:  Negative for abdominal pain, constipation, diarrhea, nausea and vomiting.  Genitourinary:  Negative for dysuria and urgency.  Musculoskeletal:  Positive for arthralgias (rt knee) and myalgias. Negative for back pain.  Skin:  Negative for rash.  Neurological:  Positive for weakness. Negative for dizziness and headaches.  Psychiatric/Behavioral:  Negative for dysphoric mood. The patient is not nervous/anxious.     Objective:  BP 136/60   Pulse 72   Temp (!) 97.5 F (36.4 C)   Resp 16   Ht 5\' 2"  (1.575 m)   Wt 141 lb (64 kg)   BMI 25.79 kg/m   BP/Weight 12/14/2020 09/14/2020 6/76/1948  Systolic BP 932 671 245  Diastolic BP 60 60 64  Wt. (Lbs) 141 135 139.2  BMI 25.79 24.69 25.46    Physical Exam Vitals reviewed.  Constitutional:      General: She is not in acute distress.    Appearance: Normal appearance. She is obese.  HENT:     Right Ear: Tympanic membrane and ear canal normal.     Left Ear: Tympanic membrane and ear canal normal.     Nose: Nose normal. No congestion or rhinorrhea.  Eyes:     Conjunctiva/sclera: Conjunctivae normal.  Neck:     Thyroid: No thyroid mass.     Vascular: No carotid bruit.  Cardiovascular:     Rate and Rhythm: Normal rate and regular rhythm.     Pulses: Normal pulses.     Heart sounds: No murmur heard. Pulmonary:     Effort: Pulmonary effort is normal.     Breath sounds: Normal breath sounds.  Abdominal:     General: Bowel sounds are normal.     Palpations: Abdomen is soft. There is no mass.     Tenderness: There is no abdominal  tenderness.  Musculoskeletal:        General: Tenderness (rt knee.) present.  Lymphadenopathy:     Cervical: No cervical adenopathy.  Neurological:     Mental Status: She is alert and oriented to person, place, and time.     Cranial  Nerves: No cranial nerve deficit.  Psychiatric:        Mood and Affect: Mood normal.        Behavior: Behavior normal.    Diabetic Foot Exam - Simple   No data filed      Lab Results  Component Value Date   WBC 9.1 09/14/2020   HGB 12.1 09/14/2020   HCT 39.0 09/14/2020   PLT 268 09/14/2020   GLUCOSE 133 (H) 10/09/2020   CHOL 169 09/14/2020   TRIG 168 (H) 09/14/2020   HDL 53 09/14/2020   LDLCALC 87 09/14/2020   ALT 6 10/09/2020   AST 9 10/09/2020   NA 142 10/09/2020   K 4.9 10/09/2020   CL 107 (H) 10/09/2020   CREATININE 1.46 (H) 10/09/2020   BUN 27 10/09/2020   CO2 20 10/09/2020   TSH 3.590 05/04/2020   INR 1.0 05/04/2020   HGBA1C 7.8 (H) 09/14/2020   MICROALBUR 30 05/04/2020      Assessment & Plan:   1. Diabetic glomerulopathy ([place]) Control: Improved. Well controlled.  Recommend check sugars fasting daily. Recommend check feet daily. Recommend annual eye exams. Medicines: no changes Continue to work on eating a healthy diet and exercise.  Labs drawn today.   - Hemoglobin A1c  2. Mixed hyperlipidemia Well controlled.  No changes to medicines.  Continue to work on eating a healthy diet and exercise.  Labs drawn today.  - Lipid panel  3. Hypertensive kidney disease with stage 3b chronic kidney disease ([place]) Hypertension well controlled.  CKD is fairly stable. Pt has not seen nephrology.  Recommend referral. Continue to work on eating a healthy diet and exercise.  Labs drawn today.   - CBC with Differential/Platelet - Comprehensive metabolic panel - TSH  4. Osteopenia, unspecified location Recommend calcium with D.  5. Preoperative clearance - not cleared. - Protime-INR - MRSA culture - POCT URINALYSIS DIP  (CLINITEK) - Urine Culture - DG Chest 2 View: reviewed and is normal. - EKG 12-Lead - NSR no st Changes  6. Microcyctic anemia Not able to clear for TKR until we determine source of anemia.  - HB decreased from 12.1 to 9.6. It is microcytic.  - Repeat hb was 9.7 ON 7/7.  - Iron studies came back low.  - Pt took hemoccult cards home: pending still.      Orders Placed This Encounter  Procedures   MRSA culture   Urine Culture   DG Chest 2 View   CBC with Differential/Platelet   Comprehensive metabolic panel   Hemoglobin A1c   Lipid panel   TSH   Protime-INR   POCT URINALYSIS DIP (CLINITEK)   EKG 12-Lead     Follow-up: No follow-ups on file.  An After Visit Summary was printed and given to the patient.  [name], MD [facility] [phone]

## 2020-12-15 LAB — COMPREHENSIVE METABOLIC PANEL
ALT: 11 IU/L (ref 0–32)
AST: 13 IU/L (ref 0–40)
Albumin/Globulin Ratio: 2.4 — ABNORMAL HIGH (ref 1.2–2.2)
Albumin: 4.7 g/dL (ref 3.7–4.7)
Alkaline Phosphatase: 62 IU/L (ref 44–121)
BUN/Creatinine Ratio: 13 (ref 12–28)
BUN: 22 mg/dL (ref 8–27)
Bilirubin Total: 1 mg/dL (ref 0.0–1.2)
CO2: 19 mmol/L — ABNORMAL LOW (ref 20–29)
Calcium: 9.5 mg/dL (ref 8.7–10.3)
Chloride: 104 mmol/L (ref 96–106)
Creatinine, Ser: 1.65 mg/dL — ABNORMAL HIGH (ref 0.57–1.00)
Globulin, Total: 2 g/dL (ref 1.5–4.5)
Glucose: 120 mg/dL — ABNORMAL HIGH (ref 65–99)
Potassium: 4.3 mmol/L (ref 3.5–5.2)
Sodium: 139 mmol/L (ref 134–144)
Total Protein: 6.7 g/dL (ref 6.0–8.5)
eGFR: 33 mL/min/{1.73_m2} — ABNORMAL LOW (ref >59)

## 2020-12-15 LAB — TSH: TSH: 4.89 u[IU]/mL — ABNORMAL HIGH (ref 0.450–4.500)

## 2020-12-15 LAB — HEMOGLOBIN A1C
Est. average glucose Bld gHb Est-mCnc: 157 mg/dL
Hgb A1c MFr Bld: 7.1 % — ABNORMAL HIGH (ref 4.8–5.6)

## 2020-12-15 LAB — CBC WITH DIFFERENTIAL/PLATELET
Basophils Absolute: 0 10*3/uL (ref 0.0–0.2)
Basos: 1 %
EOS (ABSOLUTE): 0.1 10*3/uL (ref 0.0–0.4)
Eos: 3 %
Hematocrit: 32.6 % — ABNORMAL LOW (ref 34.0–46.6)
Hemoglobin: 9.6 g/dL — ABNORMAL LOW (ref 11.1–15.9)
Immature Grans (Abs): 0 10*3/uL (ref 0.0–0.1)
Immature Granulocytes: 0 %
Lymphocytes Absolute: 1.6 10*3/uL (ref 0.7–3.1)
Lymphs: 32 %
MCH: 23.1 pg — ABNORMAL LOW (ref 26.6–33.0)
MCHC: 29.4 g/dL — ABNORMAL LOW (ref 31.5–35.7)
MCV: 79 fL (ref 79–97)
Monocytes Absolute: 0.4 10*3/uL (ref 0.1–0.9)
Monocytes: 7 %
Neutrophils Absolute: 2.9 10*3/uL (ref 1.4–7.0)
Neutrophils: 57 %
Platelets: 191 10*3/uL (ref 150–450)
RBC: 4.15 x10E6/uL (ref 3.77–5.28)
RDW: 15.2 % (ref 11.7–15.4)
WBC: 5 10*3/uL (ref 3.4–10.8)

## 2020-12-15 LAB — LIPID PANEL
Chol/HDL Ratio: 2.6 ratio (ref 0.0–4.4)
Cholesterol, Total: 139 mg/dL (ref 100–199)
HDL: 53 mg/dL (ref >39)
LDL Chol Calc (NIH): 65 mg/dL (ref 0–99)
Triglycerides: 116 mg/dL (ref 0–149)
VLDL Cholesterol Cal: 21 mg/dL (ref 5–40)

## 2020-12-15 LAB — PROTIME-INR
INR: 1 (ref 0.9–1.2)
Prothrombin Time: 10.2 s (ref 9.1–12.0)

## 2020-12-15 LAB — CARDIOVASCULAR RISK ASSESSMENT

## 2020-12-16 LAB — URINE CULTURE

## 2020-12-18 LAB — MRSA CULTURE: MRSA Screen: NEGATIVE

## 2020-12-19 DIAGNOSIS — H02829 Cysts of unspecified eye, unspecified eyelid: Secondary | ICD-10-CM | POA: Diagnosis not present

## 2020-12-20 ENCOUNTER — Other Ambulatory Visit: Payer: Self-pay

## 2020-12-20 DIAGNOSIS — D509 Iron deficiency anemia, unspecified: Secondary | ICD-10-CM | POA: Diagnosis not present

## 2020-12-20 LAB — CBC
Hematocrit: 32 % — ABNORMAL LOW (ref 34.0–46.6)
Hemoglobin: 9.7 g/dL — ABNORMAL LOW (ref 11.1–15.9)
MCH: 23.9 pg — ABNORMAL LOW (ref 26.6–33.0)
MCHC: 30.3 g/dL — ABNORMAL LOW (ref 31.5–35.7)
MCV: 79 fL (ref 79–97)
Platelets: 206 10*3/uL (ref 150–450)
RBC: 4.06 x10E6/uL (ref 3.77–5.28)
RDW: 15.2 % (ref 11.7–15.4)
WBC: 4.3 10*3/uL (ref 3.4–10.8)

## 2020-12-21 ENCOUNTER — Ambulatory Visit (INDEPENDENT_AMBULATORY_CARE_PROVIDER_SITE_OTHER): Payer: PPO | Admitting: Family Medicine

## 2020-12-21 ENCOUNTER — Other Ambulatory Visit: Payer: Self-pay

## 2020-12-21 VITALS — BP 116/52 | HR 78 | Temp 97.5°F | Resp 16 | Ht 62.0 in | Wt 141.4 lb

## 2020-12-21 DIAGNOSIS — I129 Hypertensive chronic kidney disease with stage 1 through stage 4 chronic kidney disease, or unspecified chronic kidney disease: Secondary | ICD-10-CM

## 2020-12-21 DIAGNOSIS — E1121 Type 2 diabetes mellitus with diabetic nephropathy: Secondary | ICD-10-CM

## 2020-12-21 DIAGNOSIS — N1832 Chronic kidney disease, stage 3b: Secondary | ICD-10-CM

## 2020-12-21 DIAGNOSIS — E782 Mixed hyperlipidemia: Secondary | ICD-10-CM | POA: Diagnosis not present

## 2020-12-21 DIAGNOSIS — D500 Iron deficiency anemia secondary to blood loss (chronic): Secondary | ICD-10-CM | POA: Diagnosis not present

## 2020-12-21 DIAGNOSIS — D509 Iron deficiency anemia, unspecified: Secondary | ICD-10-CM

## 2020-12-21 NOTE — Progress Notes (Signed)
Subjective:  Patient ID: Alicia Jordan, female    DOB: 04/28/1946  Age: 75 y.o. MRN: [id number]  Chief Complaint  Patient presents with   Hypertension   Diabetes   Anemia    HPI Patient is a 75 yo WF with well controlled diabetes, hypertension, CKD stage 3,  and hyperlipidemia, who presents for follow up from appt last week for preop clearance. This was originally her chronic follow up visit. The patient's labs were good except she was newly anemic (microcytic.) She just picked up hemoccult cards yesterday, so they are not complete. Pt's last colonoscopy was normal in 2020. She is not dizzy or lightheaded.   Current Outpatient Medications on File Prior to Visit  Medication Sig Dispense Refill   Acetaminophen (TYLENOL PO) Take by mouth as needed.     calcium-vitamin D (OSCAL WITH D) 500-200 MG-UNIT tablet Take 1 tablet by mouth.     Cetirizine HCl (ZYRTEC ALLERGY) 10 MG CAPS Take by mouth daily.     Dapagliflozin-metFORMIN HCl ER (XIGDUO XR) 5-500 MG TB24 Take 2 tablets by mouth daily. 180 tablet 2   diphenhydrAMINE (BENADRYL) 25 mg capsule Take 25 mg by mouth every 6 (six) hours as needed.     FLUoxetine (PROZAC) 20 MG capsule TAKE 3 CAPSULES BY MOUTH ONCE DAILY 270 capsule 0   losartan (COZAAR) 100 MG tablet Take 1 tablet by mouth once daily 90 tablet 0   pantoprazole (PROTONIX) 40 MG tablet Take 1 tablet by mouth twice daily 180 tablet 3   simvastatin (ZOCOR) 40 MG tablet TAKE 1 TABLET BY MOUTH AT BEDTIME 90 tablet 0   Current Facility-Administered Medications on File Prior to Visit  Medication Dose Route Frequency Provider Last Rate Last Admin   triamcinolone acetonide (KENALOG-40) injection 80 mg  80 mg Intra-articular Once Axtyn Woehler, Elnita Maxwell, MD       Past Medical History:  Diagnosis Date   Allergy    Anxiety    Arthralgia of left temporomandibular joint    Arthritis    Cervicalgia    Chronic kidney disease, stage 3a ([place])    Depression    Diabetes mellitus without  complication ([place])    Hesitancy of micturition    Hyperlipidemia    Hypertension    Osteoporosis    Other psoriasis    Other specified menopausal and perimenopausal disorders    Overweight    Repeated falls    Residual hemorrhoidal skin tags    Past Surgical History:  Procedure Laterality Date   ABDOMINAL HYSTERECTOMY     APPENDECTOMY     CARPAL TUNNEL RELEASE Right    CHOLECYSTECTOMY     COLONOSCOPY     eye lid surgery     KNEE SURGERY Left     Family History  Problem Relation Age of Onset   Colon cancer Daughter    Cancer Daughter    Diabetes type II Mother    Hyperlipidemia Mother    Hypertension Mother    Heart attack Father    Hyperlipidemia Father    Hypertension Father    Stroke Father    Diabetes type II Father    Esophageal cancer Neg Hx    Rectal cancer Neg Hx    Stomach cancer Neg Hx    Social History   Socioeconomic History   Marital status: Married    Spouse name: Joneen Boers   Number of children: 4   Years of education: Not on file   Highest education level: Not on file  Occupational History   Occupation: Homemaker  Tobacco Use   Smoking status: Never   Smokeless tobacco: Never  Vaping Use   Vaping Use: Never used  Substance and Sexual Activity   Alcohol use: Never   Drug use: Never   Sexual activity: Not on file  Other Topics Concern   Not on file  Social History Narrative   Erynne spends a lot if time helping her daughter get to and from oncology appointments   Her husband has started drinking alcohol again   Social Determinants of Health   Financial Resource Strain: Not on file  Food Insecurity: No Food Insecurity   Worried About Charity fundraiser in the Last Year: Never true   Arboriculturist in the Last Year: Never true  Transportation Needs: No Transportation Needs   Lack of Transportation (Medical): No   Lack of Transportation (Non-Medical): No  Physical Activity: Not on file  Stress: Stress Concern Present   Feeling of Stress  : To some extent  Social Connections: Not on file    Review of Systems  Constitutional:  Positive for fatigue. Negative for chills and fever.  HENT:  Negative for congestion, rhinorrhea and sore throat.   Respiratory:  Negative for cough and shortness of breath.   Cardiovascular:  Negative for chest pain.  Gastrointestinal:  Negative for abdominal pain, constipation, diarrhea, nausea and vomiting.  Genitourinary:  Positive for urgency. Negative for dysuria.       Poor bladder control  Musculoskeletal:  Positive for arthralgias (bilateral knee pain) and myalgias. Negative for back pain.  Neurological:  Negative for dizziness, weakness, light-headedness and headaches.  Psychiatric/Behavioral:  Negative for behavioral problems and dysphoric mood. The patient is not nervous/anxious.     Objective:  BP (!) 116/52   Pulse 78   Temp (!) 97.5 F (36.4 C)   Resp 16   Ht 5\' 2"  (1.575 m)   Wt 141 lb 6.4 oz (64.1 kg)   BMI 25.86 kg/m   BP/Weight 12/21/2020 09/18/8097 01/17/3824  Systolic BP 053 976 734  Diastolic BP 52 60 60  Wt. (Lbs) 141.4 141 135  BMI 25.86 25.79 24.69    Physical Exam Vitals reviewed.  Constitutional:      Appearance: Normal appearance. She is normal weight.  Neck:     Vascular: No carotid bruit.  Cardiovascular:     Rate and Rhythm: Normal rate and regular rhythm.     Pulses: Normal pulses.     Heart sounds: Normal heart sounds.  Pulmonary:     Effort: Pulmonary effort is normal. No respiratory distress.     Breath sounds: Normal breath sounds.  Abdominal:     General: Abdomen is flat. Bowel sounds are normal.     Palpations: Abdomen is soft.     Tenderness: There is no abdominal tenderness.  Neurological:     Mental Status: She is alert and oriented to person, place, and time.  Psychiatric:        Mood and Affect: Mood normal.        Behavior: Behavior normal.    Diabetic Foot Exam - Simple   No data filed      Lab Results  Component Value Date    WBC 4.3 12/20/2020   HGB 9.7 (L) 12/20/2020   HCT 32.0 (L) 12/20/2020   PLT 206 12/20/2020   GLUCOSE 120 (H) 12/14/2020   CHOL 139 12/14/2020   TRIG 116 12/14/2020   HDL 53 12/14/2020  LDLCALC 65 12/14/2020   ALT 11 12/14/2020   AST 13 12/14/2020   NA 139 12/14/2020   K 4.3 12/14/2020   CL 104 12/14/2020   CREATININE 1.65 (H) 12/14/2020   BUN 22 12/14/2020   CO2 19 (L) 12/14/2020   TSH 4.890 (H) 12/14/2020   INR 1.0 12/14/2020   HGBA1C 7.1 (H) 12/14/2020   MICROALBUR 30 05/04/2020      Assessment & Plan:   1. Iron deficiency anemia due to chronic blood loss - Ambulatory referral to Gastroenterology - recommend iron sulfate 325 mg once wice daily.  2. Diabetic glomerulopathy ([place]) Well controlled, but on xigduo 5/500 mg one twice a day. Nephrology may wish not to have her on this dose of metformin.    3. Mixed hyperlipidemia The current medical regimen is effective;  continue present plan and medications.  4. Hypertensive kidney disease with stage 3b chronic kidney disease ([place])  Refer to nephrology  Orders Placed This Encounter  Procedures   Ambulatory referral to Gastroenterology     Follow-up: Return in about 3 months (around 03/23/2021) for fasting.  An After Visit Summary was printed and given to the patient.  [name], MD [facility] [phone]

## 2020-12-23 ENCOUNTER — Encounter: Payer: Self-pay | Admitting: Family Medicine

## 2020-12-24 ENCOUNTER — Telehealth: Payer: Self-pay

## 2020-12-24 ENCOUNTER — Telehealth: Payer: PPO

## 2020-12-24 NOTE — Progress Notes (Signed)
Chronic Care Management Pharmacy Assistant   Name: Alicia Jordan  MRN: [id number] DOB: 04/20/1946  Reason for Encounter: Disease State- Diabetes   Recent office visits:  12/21/20- Dr. Tobie Poet- PCP. Recommended to take Iron 325 mg twice daily. No other medication changes. Referred to nephrology and gastroenterology. Follow up in 3 months.  12/14/20- Dr. Tobie Poet- PCP. Ordered CBC with diff, CMP, A1C, Urinalysis/culture, TSH, Lipid panel, MRSA culture, PT-INR. Recommended Iron 325 mg once daily. No changes to prescribed medications.   Recent consult visits:  None since last CCM visit  Hospital visits:  None in previous 6 months  Medications: Outpatient Encounter Medications as of 12/24/2020  Medication Sig   Acetaminophen (TYLENOL PO) Take by mouth as needed.   calcium-vitamin D (OSCAL WITH D) 500-200 MG-UNIT tablet Take 1 tablet by mouth.   Cetirizine HCl (ZYRTEC ALLERGY) 10 MG CAPS Take by mouth daily.   Dapagliflozin-metFORMIN HCl ER (XIGDUO XR) 5-500 MG TB24 Take 2 tablets by mouth daily.   diphenhydrAMINE (BENADRYL) 25 mg capsule Take 25 mg by mouth every 6 (six) hours as needed.   FLUoxetine (PROZAC) 20 MG capsule TAKE 3 CAPSULES BY MOUTH ONCE DAILY   losartan (COZAAR) 100 MG tablet Take 1 tablet by mouth once daily   pantoprazole (PROTONIX) 40 MG tablet Take 1 tablet by mouth twice daily   simvastatin (ZOCOR) 40 MG tablet TAKE 1 TABLET BY MOUTH AT BEDTIME   Facility-Administered Encounter Medications as of 12/24/2020  Medication   triamcinolone acetonide (KENALOG-40) injection 80 mg      Recent Relevant Labs: Lab Results  Component Value Date/Time   HGBA1C 7.1 (H) 12/14/2020 09:45 AM   HGBA1C 7.8 (H) 09/14/2020 09:43 AM   MICROALBUR 30 05/04/2020 09:13 AM   MICROALBUR 80 08/11/2019 09:50 AM    Kidney Function Lab Results  Component Value Date/Time   CREATININE 1.65 (H) 12/14/2020 09:45 AM   CREATININE 1.46 (H) 10/09/2020 10:33 AM   GFRNONAA 41 (L) 05/04/2020 09:35 AM    GFRAA 47 (L) 05/04/2020 09:35 AM     Contacted patient on 12/24/20 to discuss diabetes disease state.   Patient states that she saw her nephrologist about 3 weeks ago. She has not yet been scheduled for gastroenterology but is aware of the referral.   Current antihyperglycemic regimen:  Xigduo 5-500- 1 tablet twice daily (gets from patient assistance)   Patient verbally confirms she is taking the above medications as directed. Yes  What diet changes have been made to improve diabetes control? No recent diet changes.  What recent interventions/DTPs have been made to improve glycemic control:  No recent interventions.  Have there been any recent hospitalizations or ED visits since last visit with CPP? No  Patient denies hypoglycemic symptoms, including Pale, Sweaty, Shaky, Hungry, Nervous/irritable, and Vision changes  Patient denies hyperglycemic symptoms, including blurry vision, excessive thirst, fatigue, polyuria, and weakness  How often are you checking your blood sugar? once daily  What are your blood sugars ranging?  Fasting: States it averages around 140. Lowest reading usually around 135, highest reading usually around 153. Before meals: N/A After meals: N/A Bedtime: N/A  During the week, how often does your blood glucose drop below 70? Never  Are you checking your feet daily/regularly? Yes- denies any wounds or sores.   Adherence Review: Is the patient currently on a STATIN medication? Yes Is the patient currently on ACE/ARB medication? Yes Does the patient have >5 day gap between last estimated fill dates? No  Care Gaps: Last annual wellness visit: 09/06/20 Last eye exam / retinopathy screening: 01/2020 Last diabetic foot exam: 12/14/20   Counseled patient on importance of annual eye and foot exam.   Star Rating Drugs:  Medication:  Last Fill: Day Supply Losartan 100 mg 12/11/20 90 Xigduo 5-500 mg Patient Assistance Simvastatin 40 mg 10/18/20  90  Rescheduled  follow up telephone visit with Sherre Poot on 01/14/21 at 10:15. Patient was reminded to have all medications, supplements and any blood glucose and blood pressure readings available for review at appointment.  Sherre Poot, CPP notified  [name], [name] Pharmacy Assistant [phone]

## 2020-12-25 ENCOUNTER — Ambulatory Visit: Payer: PPO

## 2020-12-26 DIAGNOSIS — M9903 Segmental and somatic dysfunction of lumbar region: Secondary | ICD-10-CM | POA: Diagnosis not present

## 2020-12-26 DIAGNOSIS — M9904 Segmental and somatic dysfunction of sacral region: Secondary | ICD-10-CM | POA: Diagnosis not present

## 2020-12-26 DIAGNOSIS — M5136 Other intervertebral disc degeneration, lumbar region: Secondary | ICD-10-CM | POA: Diagnosis not present

## 2020-12-26 DIAGNOSIS — M5442 Lumbago with sciatica, left side: Secondary | ICD-10-CM | POA: Diagnosis not present

## 2020-12-28 ENCOUNTER — Encounter: Payer: Self-pay | Admitting: Gastroenterology

## 2020-12-31 ENCOUNTER — Telehealth: Payer: Self-pay

## 2020-12-31 NOTE — Telephone Encounter (Signed)
Joann with Dr. Royce Macadamia [facility]) called stating that Dr. Royce Macadamia recommends limiting patients metformin to 500 mg qd and staying well hydrated.  Note from [facility] is scanned in under media (12/05/2020).

## 2021-01-01 ENCOUNTER — Other Ambulatory Visit (INDEPENDENT_AMBULATORY_CARE_PROVIDER_SITE_OTHER): Payer: PPO

## 2021-01-01 DIAGNOSIS — D500 Iron deficiency anemia secondary to blood loss (chronic): Secondary | ICD-10-CM | POA: Diagnosis not present

## 2021-01-01 LAB — POC HEMOCCULT BLD/STL (HOME/3-CARD/SCREEN)
Card #2 Fecal Occult Blod, POC: NEGATIVE
Card #3 Fecal Occult Blood, POC: NEGATIVE
Fecal Occult Blood, POC: POSITIVE — AB

## 2021-01-02 ENCOUNTER — Ambulatory Visit (INDEPENDENT_AMBULATORY_CARE_PROVIDER_SITE_OTHER): Payer: PPO | Admitting: Gastroenterology

## 2021-01-02 ENCOUNTER — Other Ambulatory Visit: Payer: Self-pay

## 2021-01-02 VITALS — BP 152/82 | HR 83 | Ht 62.0 in | Wt 147.1 lb

## 2021-01-02 DIAGNOSIS — D509 Iron deficiency anemia, unspecified: Secondary | ICD-10-CM | POA: Diagnosis not present

## 2021-01-02 DIAGNOSIS — Z800 Family history of malignant neoplasm of digestive organs: Secondary | ICD-10-CM

## 2021-01-02 DIAGNOSIS — R1310 Dysphagia, unspecified: Secondary | ICD-10-CM | POA: Diagnosis not present

## 2021-01-02 DIAGNOSIS — R195 Other fecal abnormalities: Secondary | ICD-10-CM | POA: Diagnosis not present

## 2021-01-02 DIAGNOSIS — K219 Gastro-esophageal reflux disease without esophagitis: Secondary | ICD-10-CM | POA: Diagnosis not present

## 2021-01-02 MED ORDER — PANTOPRAZOLE SODIUM 40 MG PO TBEC: 40.0000 mg | DELAYED_RELEASE_TABLET | Freq: Every day | ORAL | 3 refills | Status: DC

## 2021-01-02 NOTE — Patient Instructions (Addendum)
If you are age 73 or older, your body mass index should be between 23-30. Your Body mass index is 26.91 kg/m. If this is out of the aforementioned range listed, please consider follow up with your Primary Care Provider.  If you are age 58 or younger, your body mass index should be between 19-25. Your Body mass index is 26.91 kg/m. If this is out of the aformentioned range listed, please consider follow up with your Primary Care Provider.   __________________________________________________________  The [place] [facility] providers would like to encourage you to use [facility] to communicate with providers for non-urgent requests or questions.  Due to long hold times on the telephone, sending your provider a message by [facility] may be a faster and more efficient way to get a response.  Please allow 48 business hours for a response.  Please remember that this is for non-urgent requests.   You have been scheduled for an endoscopy and colonoscopy. Please follow the written instructions given to you at your visit today. Please pick up your prep supplies at the pharmacy within the next 1-3 days. If you use inhalers (even only as needed), please bring them with you on the day of your procedure.  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  Please call with any questions or concerns.  Thank you,  Dr. Jackquline Denmark

## 2021-01-02 NOTE — Progress Notes (Signed)
Chief Complaint: Anemia  Referring Provider:  [name], MD      ASSESSMENT AND PLAN;   #1. IDA with hem + stools  #2. GERD with occ dysphaia  #3. FH colon Ca (daughter at age 73)  #69. DM2 with CKD3 (Dr Harrie Jeans, GFR 33 ml/min) with some element of anemia of chronic disease.  Plan: -Decrease Protonix 40mg  po qd -EGD/colon with miralax -Avoid NSAIDs -Check CBC, CMP, iron studies at time of endoscopic procedures. -If still with problems, NCCT followed by VCE -She has appt with Dr Royce Macadamia. May have to move it sooner if still anemic.  -For now continue p.o. iron.  May benefit from IV iron/EPO   I discussed the nature of the recommended EGD/Colonoscopy , as well as the indications, risks, alternatives and potential complications including, but not limited to, bleeding, infection, reaction to medication, damage to internal organs, cardiac and/or pulmonary problems, and perforation requiring surgery (1 to 2 in 1000). The possibility that significant findings could be missed was explained. All ? were answered. The patient gives consent for the procedures.  HPI:    Alicia Jordan is a 75 y.o. female  With diabetes, hypertension, CKD3, anxiety/depression and hyperlipidemia  For preop eval for R TKR  Found to be anemic with hemoglobin 9.6 (baseline 12 09/2020, 3 months ago) with 1/3 heme + stools.  Started on iron supplements.  Seen as an emergency workin.  She does feel tired and fatigued.  No history of pica to ice.  Has been having occasional dysphagia mostly to solids-mid chest.  No heartburn but has been taking Protonix 40 mg p.o. BID.  No odynophagia.  No melena or hematochezia.  History of chronic longstanding intermittent diarrhea which is better now since she has been off metformin.  No weight loss.  No jaundice dark urine or pale stools.  No fever chills or night sweats.  She is being followed by Dr. Harrie Jeans for CKD3.  No sodas, chocolates, chewing gums,  artificial sweeteners and candy. No NSAIDs   Wt Readings from Last 3 Encounters:  01/02/21 147 lb 2 oz (66.7 kg)  12/21/20 141 lb 6.4 oz (64.1 kg)  12/14/20 141 lb (64 kg)   Past GI procedures:  EGD 08/2012 by Dr. Lyda Jester -Irregular Z-line at 36 cm.  Negative esophageal biopsies for Barrett's. And neg for eosinophilic esophagitis -Mild gastritis -S/P dilatation with 56 F. Maloney  Colonoscopy 05/2019 (FH colon cancer in daughter at age 72) -Mild sigmoid diverticulosis -Internal hemorrhoids -Otherwise normal to TI. -Random colon biopsies showed focal active colitis.  Also had EGD 04/2009, 2003 under procedures tab.  Past Medical History:  Diagnosis Date   Allergy    Anxiety    Arthralgia of left temporomandibular joint    Arthritis    Cervicalgia    Chronic kidney disease, stage 3a ([facility])    Depression    Diabetes mellitus without complication ([place])    Hesitancy of micturition    Hyperlipidemia    Hypertension    Osteoporosis    Other psoriasis    Other specified menopausal and perimenopausal disorders    Overweight    Repeated falls    Residual hemorrhoidal skin tags     Past Surgical History:  Procedure Laterality Date   ABDOMINAL HYSTERECTOMY     APPENDECTOMY     CARPAL TUNNEL RELEASE Right    CHOLECYSTECTOMY     COLONOSCOPY  04/09/2016   Mild sigmoid diverticulosis. Otherwise normal colonoscopy   ESOPHAGOGASTRODUODENOSCOPY  09/10/2012   No obvious esophageal stricture, status post empiric esophageal dilation. Irregular GE junction, questionable significance. Mild gastritis.   eye lid surgery     KNEE SURGERY Left     Family History  Problem Relation Age of Onset   Colon cancer Daughter    Cancer Daughter    Diabetes type II Mother    Hyperlipidemia Mother    Hypertension Mother    Heart attack Father    Hyperlipidemia Father    Hypertension Father    Stroke Father    Diabetes type II Father    Esophageal cancer Neg Hx    Rectal cancer Neg Hx     Stomach cancer Neg Hx     Social History   Tobacco Use   Smoking status: Never   Smokeless tobacco: Never  Vaping Use   Vaping Use: Never used  Substance Use Topics   Alcohol use: Never   Drug use: Never    Current Outpatient Medications  Medication Sig Dispense Refill   Acetaminophen (TYLENOL PO) Take by mouth as needed.     Cetirizine HCl (ZYRTEC ALLERGY) 10 MG CAPS Take by mouth daily.     Cholecalciferol (VITAMIN D3) 25 MCG (1000 UT) CAPS Take 1 capsule by mouth daily.     Dapagliflozin-metFORMIN HCl ER (XIGDUO XR) 5-500 MG TB24 Take 2 tablets by mouth daily. 180 tablet 2   diphenhydrAMINE (BENADRYL) 25 mg capsule Take 25 mg by mouth every 6 (six) hours as needed.     Ferrous Sulfate (IRON PO) Take 2 tablets by mouth daily in the afternoon.     FLUoxetine (PROZAC) 20 MG capsule TAKE 3 CAPSULES BY MOUTH ONCE DAILY 270 capsule 0   losartan (COZAAR) 100 MG tablet Take 1 tablet by mouth once daily 90 tablet 0   pantoprazole (PROTONIX) 40 MG tablet Take 1 tablet by mouth twice daily 180 tablet 3   simvastatin (ZOCOR) 40 MG tablet TAKE 1 TABLET BY MOUTH AT BEDTIME 90 tablet 0   Current Facility-Administered Medications  Medication Dose Route Frequency Provider Last Rate Last Admin   triamcinolone acetonide (KENALOG-40) injection 80 mg  80 mg Intra-articular Once Cox, Kirsten, MD        Allergies  Allergen Reactions   Meloxicam     Review of Systems:  Constitutional: Denies fever, chills, diaphoresis, appetite change and has fatigue.  HEENT: Denies photophobia, eye pain, redness, hearing loss, ear pain, congestion, sore throat, rhinorrhea, sneezing, mouth sores, neck pain, neck stiffness and tinnitus.   Respiratory: Denies SOB, DOE, cough, chest tightness,  and wheezing.   Cardiovascular: Denies chest pain, palpitations and leg swelling.  Genitourinary: Denies dysuria, urgency, frequency, hematuria, flank pain and difficulty urinating.  Musculoskeletal: Denies myalgias,  back pain, joint swelling, arthralgias and gait problem.  Skin: No rash.  Neurological: Denies dizziness, seizures, syncope, weakness, light-headedness, numbness and headaches.  Hematological: Denies adenopathy. Easy bruising, personal or family bleeding history  Psychiatric/Behavioral: Has anxiety or depression     Physical Exam:    BP (!) 152/82   Pulse 83   Ht 5\' 2"  (1.575 m)   Wt 147 lb 2 oz (66.7 kg)   BMI 26.91 kg/m  Wt Readings from Last 3 Encounters:  01/02/21 147 lb 2 oz (66.7 kg)  12/21/20 141 lb 6.4 oz (64.1 kg)  12/14/20 141 lb (64 kg)   Constitutional:  Well-developed, in no acute distress. Psychiatric: Normal mood and affect. Behavior is normal. HEENT: Pupils normal.  Conjunctivae are normal. No  scleral icterus. Neck supple.  Cardiovascular: Normal rate, regular rhythm. No edema Pulmonary/chest: Effort normal and breath sounds normal. No wheezing, rales or rhonchi. Abdominal: Soft, nondistended. Nontender. Bowel sounds active throughout. There are no masses palpable. No hepatomegaly. Rectal: Deferred Neurological: Alert and oriented to person place and time. Skin: Skin is warm and dry. No rashes noted.  Data Reviewed: I have personally reviewed following labs and imaging studies  CBC: CBC Latest Ref Rng & Units 12/20/2020 12/14/2020 09/14/2020  WBC 3.4 - 10.8 x10E3/uL 4.3 5.0 9.1  Hemoglobin 11.1 - 15.9 g/dL 9.7(L) 9.6(L) 12.1  Hematocrit 34.0 - 46.6 % 32.0(L) 32.6(L) 39.0  Platelets 150 - 450 x10E3/uL 206 191 268    CMP: CMP Latest Ref Rng & Units 12/14/2020 10/09/2020 09/21/2020  Glucose 65 - 99 mg/dL 120(H) 133(H) 142(H)  BUN 8 - 27 mg/dL 22 27 17   Creatinine 0.57 - 1.00 mg/dL 1.65(H) 1.46(H) 1.41(H)  Sodium 134 - 144 mmol/L 139 142 144  Potassium 3.5 - 5.2 mmol/L 4.3 4.9 4.4  Chloride 96 - 106 mmol/L 104 107(H) 108(H)  CO2 20 - 29 mmol/L 19(L) 20 21  Calcium 8.7 - 10.3 mg/dL 9.5 9.4 9.5  Total Protein 6.0 - 8.5 g/dL 6.7 6.6 6.4  Total Bilirubin 0.0 - 1.2  mg/dL 1.0 0.7 0.7  Alkaline Phos 44 - 121 IU/L 62 57 59  AST 0 - 40 IU/L 13 9 12   ALT 0 - 32 IU/L 11 6 7         Carmell Austria, MD 01/02/2021, 3:58 PM  Cc: [name], MD

## 2021-01-10 LAB — IRON AND TIBC
Iron Saturation: 10 % — ABNORMAL LOW (ref 15–55)
Iron: 44 ug/dL (ref 27–139)
Total Iron Binding Capacity: 442 ug/dL (ref 250–450)
UIBC: 398 ug/dL — ABNORMAL HIGH (ref 118–369)

## 2021-01-10 LAB — SPECIMEN STATUS REPORT

## 2021-01-10 LAB — FERRITIN: Ferritin: 8 ng/mL — ABNORMAL LOW (ref 15–150)

## 2021-01-10 NOTE — Progress Notes (Signed)
Low iron, needs to take iron '325mg'$  d lp

## 2021-01-11 ENCOUNTER — Other Ambulatory Visit: Payer: Self-pay

## 2021-01-11 DIAGNOSIS — D509 Iron deficiency anemia, unspecified: Secondary | ICD-10-CM

## 2021-01-11 NOTE — Progress Notes (Signed)
May need to refer to hematology for iron infusions lp

## 2021-01-14 ENCOUNTER — Ambulatory Visit (INDEPENDENT_AMBULATORY_CARE_PROVIDER_SITE_OTHER): Payer: PPO

## 2021-01-14 ENCOUNTER — Telehealth: Payer: Self-pay

## 2021-01-14 DIAGNOSIS — E782 Mixed hyperlipidemia: Secondary | ICD-10-CM | POA: Diagnosis not present

## 2021-01-14 DIAGNOSIS — E1121 Type 2 diabetes mellitus with diabetic nephropathy: Secondary | ICD-10-CM

## 2021-01-14 MED ORDER — XIGDUO XR 10-500 MG PO TB24: 1.0000 | ORAL_TABLET | Freq: Every day | ORAL | 1 refills | Status: DC

## 2021-01-14 NOTE — Progress Notes (Signed)
Chronic Care Management Pharmacy Note  01/20/2021 Name:  Alicia Jordan MRN:  [id number] DOB:  [birth year]   Plan Updates:  Patient recent blood sugars - 130 mg/dL today.  BP numbers at home - hasn't checked lately.   Pharmacist will Coordinate Xigduo 10/500 mg daily to reduce pill burden and maximize Farxiga component 10 mg daily.  Having colonoscopy 02/06/2021.  Patient reports staying tired and cold. She is being set up to see cancer center for anemia. She has been unable to walk due to reduce energy.   Subjective: Alicia Jordan is an 75 y.o. year old female who is a primary patient of Alicia, Kirsten, MD.  The CCM team was consulted for assistance with disease management and care coordination needs.    Engaged with patient face to face for follow up visit in response to provider referral for pharmacy case management and/or care coordination services.   Consent to Services:  The patient was given information about Chronic Care Management services, agreed to services, and gave verbal consent prior to initiation of services.  Please see initial visit note for detailed documentation.   Patient Care Team: Alicia Brome, MD as PCP - General (Family Medicine) Alicia Denmark, MD as Consulting Physician (Gastroenterology) Alicia Salm, MD as Referring Physician (Orthopedic Surgery) Alicia Jordan, [facility] as Pharmacist (Pharmacist) Alicia Huger, MD as Consulting Physician (Orthopedic Surgery)   Recent office visits:  12/31/20 - Dr. Tobie Poet changed [name] to 10/500 mg daily due to nephrotlogy recommendation.  12/21/20- Dr. Tobie Poet- PCP. Recommended to take Iron 325 mg twice daily. No other medication changes. Referred to nephrology and gastroenterology. Follow up in 3 months. 12/14/20- Dr. Tobie Poet- PCP. Ordered CBC with diff, CMP, A1C, Urinalysis/culture, TSH, Lipid panel, MRSA culture, PT-INR. Recommended Iron 325 mg once daily. No changes to prescribed medications.    Recent consult visits:  01/02/21 -  GI - decrease Protonix 40 mg daily. Avoid NSAIDs. EGD/Colonoscopy ordered with Miralax prep.  12/05/20 - Nephrology - avoid NSAIDs. Continue with losartan and [place].    Hospital visits:  None in previous 6 months     Objective:  Lab Results  Component Value Date   CREATININE 1.65 (H) 12/14/2020   BUN 22 12/14/2020   GFRNONAA 41 (L) 05/04/2020   GFRAA 47 (L) 05/04/2020   NA 139 12/14/2020   K 4.3 12/14/2020   CALCIUM 9.5 12/14/2020   CO2 19 (L) 12/14/2020   GLUCOSE 120 (H) 12/14/2020    Lab Results  Component Value Date/Time   HGBA1C 7.1 (H) 12/14/2020 09:45 AM   HGBA1C 7.8 (H) 09/14/2020 09:43 AM   MICROALBUR 30 05/04/2020 09:13 AM   MICROALBUR 80 08/11/2019 09:50 AM    Last diabetic Eye exam:  Lab Results  Component Value Date/Time   HMDIABEYEEXA No Retinopathy 02/09/2020 12:00 AM    Last diabetic Foot exam: No results found for: HMDIABFOOTEX   Lab Results  Component Value Date   CHOL 139 12/14/2020   HDL 53 12/14/2020   LDLCALC 65 12/14/2020   TRIG 116 12/14/2020   CHOLHDL 2.6 12/14/2020    Hepatic Function Latest Ref Rng & Units 12/14/2020 10/09/2020 09/21/2020  Total Protein 6.0 - 8.5 g/dL 6.7 6.6 6.4  Albumin 3.7 - 4.7 g/dL 4.7 4.6 4.4  AST 0 - 40 IU/L _0 ALT 0 - 32 IU/L _1 Alk Phosphatase 44 - 121 IU/L 62 57 59  Total Bilirubin 0.0 - 1.2 mg/dL 1.0 0.7 0.7  Lab Results  Component Value Date/Time   TSH 4.890 (H) 12/14/2020 09:45 AM   TSH 3.590 05/04/2020 09:35 AM    CBC Latest Ref Rng & Units 12/20/2020 12/14/2020 09/14/2020  WBC 3.4 - 10.8 x10E3/uL 4.3 5.0 9.1  Hemoglobin 11.1 - 15.9 g/dL 9.7(L) 9.6(L) 12.1  Hematocrit 34.0 - 46.6 % 32.0(L) 32.6(L) 39.0  Platelets 150 - 450 x10E3/uL 206 191 268    No results found for: VD25OH  Clinical ASCVD: Yes  The 10-year ASCVD risk score Alicia Bussing DC Jr., et al., 2013) is: 38.4%   Values used to calculate the score:     Age: 75 years     Sex: Female     Is Non-Hispanic African American: No      Diabetic: Yes     Tobacco smoker: No     Systolic Blood Pressure: 518 mmHg     Is BP treated: Yes     HDL Cholesterol: 53 mg/dL     Total Cholesterol: 139 mg/dL    Depression screen [facility] 2/9 12/21/2020 09/14/2020 09/06/2020  Decreased Interest 0 2 0  Down, Depressed, Hopeless 0 2 0  PHQ - 2 Score 0 4 0  Altered sleeping - 2 -  Tired, decreased energy - 2 -  Change in appetite - 2 -  Feeling bad or failure about yourself  - 2 -  Trouble concentrating - 2 -  Moving slowly or fidgety/restless - 0 -  Suicidal thoughts - 0 -  PHQ-9 Score - 14 -  Difficult doing work/chores - - -     Other: (CHADS2VASc if Afib, MMRC or CAT for COPD, ACT, DEXA)  Social History   Tobacco Use  Smoking Status Never  Smokeless Tobacco Never   BP Readings from Last 3 Encounters:  01/02/21 (!) 152/82  12/21/20 (!) 116/52  12/14/20 136/60   Pulse Readings from Last 3 Encounters:  01/02/21 83  12/21/20 78  12/14/20 72   Wt Readings from Last 3 Encounters:  01/02/21 147 lb 2 oz (66.7 kg)  12/21/20 141 lb 6.4 oz (64.1 kg)  12/14/20 141 lb (64 kg)   BMI Readings from Last 3 Encounters:  01/02/21 26.91 kg/m  12/21/20 25.86 kg/m  12/14/20 25.79 kg/m    Assessment/Interventions: Review of patient past medical history, allergies, medications, health status, including review of consultants reports, laboratory and other test data, was performed as part of comprehensive evaluation and provision of chronic care management services.   SDOH:  (Social Determinants of Health) assessments and interventions performed: Yes  SDOH Screenings   Alcohol Screen: Not on file  Depression (PHQ2-9): Low Risk    PHQ-2 Score: 0  Financial Resource Strain: Not on file  Food Insecurity: No Food Insecurity   Worried About Charity fundraiser in the Last Year: Never true   Ran Out of Food in the Last Year: Never true  Housing: Low Risk    Last Housing Risk Score: 0  Physical Activity: Not on file  Social Connections:  Not on file  Stress: Stress Concern Present   Feeling of Stress : To some extent  Tobacco Use: Low Risk    Smoking Tobacco Use: Never   Smokeless Tobacco Use: Never  Transportation Needs: No Transportation Needs   Lack of Transportation (Medical): No   Lack of Transportation (Non-Medical): No    CCM Care Plan  Allergies  Allergen Reactions   Meloxicam     Medications Reviewed Today     Reviewed by Luz Lex  B, RPH (Pharmacist) on 01/14/21 at 1002  Med List Status: <None>   Medication Order Taking? Sig Documenting Provider Last Dose Status Informant  Acetaminophen (TYLENOL PO) [id number] Yes Take by mouth as needed. [provider] Taking Active   Cetirizine HCl (ZYRTEC ALLERGY) 10 MG CAPS [id number] Yes Take by mouth daily. [provider] Taking Active   Cholecalciferol (VITAMIN D3) 25 MCG (1000 UT) CAPS [id number] Yes Take 1 capsule by mouth daily. [provider] Taking Active   Dapagliflozin-metFORMIN HCl ER (XIGDUO XR) 10-500 MG TB24 [id number] Yes Take 1 tablet by mouth daily. [provider] Taking Active   diphenhydrAMINE (BENADRYL) 25 mg capsule [id number] Yes Take 25 mg by mouth every 6 (six) hours as needed. [provider] Taking Active   Ferrous Sulfate (IRON PO) [id number] Yes Take 2 tablets by mouth daily in the afternoon. [provider] Taking Active   FLUoxetine (PROZAC) 20 MG capsule [id number] Yes TAKE 3 CAPSULES BY MOUTH ONCE DAILY Marge Duncans, PA-C Taking Active   losartan (COZAAR) 100 MG tablet [id number] Yes Take 1 tablet by mouth once daily Marge Duncans, PA-C Taking Active   pantoprazole (PROTONIX) 40 MG tablet [id number] Yes Take 1 tablet (40 mg total) by mouth daily. Alicia Denmark, MD Taking Active   simvastatin (ZOCOR) 40 MG tablet [id number] Yes TAKE 1 TABLET BY MOUTH AT BEDTIME Marge Duncans, PA-C Taking Active   triamcinolone acetonide (KENALOG-40) injection 80 mg [id number]   Alicia Brome, MD  Consider  Medication Status and Discontinue (Completed Course)             Patient Active Problem List   Diagnosis Date Noted   Moderate recurrent major depression ([place]) 09/15/2020   Hypertensive kidney disease with stage 3b chronic kidney disease ([place]) 09/15/2020   Chronic pain of both knees 04/17/2020   Pedal edema 01/20/2020   Chronic right shoulder pain 08/21/2019   Dyslipidemia associated with type 2 diabetes mellitus ([place]) 08/11/2019   Essential hypertension, benign 08/11/2019   Mixed hyperlipidemia 08/11/2019   GERD without esophagitis 04/18/2009   DYSPHAGIA 04/18/2009   ABDOMINAL PAIN-EPIGASTRIC 04/18/2009    Immunization History  Administered Date(s) Administered   Fluad Quad(high Dose 65+) 02/08/2019   Influenza-Unspecified 02/27/2020   Moderna Sars-Covid-2 Vaccination 10/31/2019, 11/28/2019, 05/31/2020, 10/12/2020   Pneumococcal Conjugate-13 05/13/2014   Pneumococcal Polysaccharide-23 01/29/2017   Tdap 05/03/2012   Zoster Recombinat (Shingrix) 06/11/2017   Zoster, Live 05/16/2010    Conditions to be addressed/monitored:  Hypertension, Hyperlipidemia and Diabetes  Care Plan : [place] Plan  Updates made by Alicia Jordan, [place] since 01/20/2021 12:00 AM     Problem: htn, hld, dm   Priority: High  Onset Date: 11/13/2020     Long-Range Goal: Disease State Management   Start Date: 11/13/2020  Expected End Date: 11/13/2021  Recent Progress: On track  Priority: High  Note:   Current Barriers: Unable to independently afford treatment regimen   Pharmacist Clinical Goal(s):  Patient will verbalize ability to afford treatment regimen through collaboration with PharmD and provider.   Interventions: 1:1 collaboration with Alicia Brome, MD regarding development and update of comprehensive plan of care as evidenced by provider attestation and co-signature Inter-disciplinary care team collaboration (see longitudinal plan of care) Comprehensive medication review  performed; medication list updated in electronic medical record   Hypertension (BP goal <130/80) -Controlled. Diagnosed >25 years ago.  -Current treatment: Losartan 100 mg daily -Medications previously tried: Losartan-hydrochlorothiazide -Current home readings: well controlled -Current dietary habits: eats lunch  out and working to [employer] -Current exercise habits: [place] [employer] -Denies hypotensive/hypertensive symptoms -Educated on BP goals and benefits of medications for prevention of heart attack, stroke and kidney damage; Daily salt intake goal < 2300 mg; Exercise goal of 150 minutes per week; -Counseled to monitor BP at home as needed, document, and provide log at future appointments -Counseled on diet and exercise extensively Recommended to continue current medication   Hyperlipidemia: (LDL goal < 70) -Not ideally controlled -Current treatment: Simvastatin 40 mg daily at bedtime - consider atorvastatin at next blood work due to rental impairment limiting  -Medications previously tried: none reported -Current dietary patterns: eats out at lunch. Trying to make smarter decisions -Current exercise habits: [place] -Educated on Cholesterol goals; Benefits of statin for ASCVD risk reduction; Importance of limiting foods high in cholesterol; Exercise goal of 150 minutes per week; -Counseled on diet and exercise extensively Recommended to continue current medication   Diabetes (A1c goal <7%) -Not ideally controlled -Current medications: Xigduo XR 10/500 mg daily -Medications previously tried: [name] -Current home glucose readings fasting glucose: ~130 mg/dL post prandial glucose: not checking -Denies hypoglycemic/hyperglycemic symptoms -Current meal patterns: Trying to make smart choices. Eats out with daughter during lunch some and sandwich or simple lunch other days.Cereal for breakfast.   -Current exercise: [place] unable to currently due to anemia.  -Educated on A1c and blood sugar goals; Complications of diabetes including kidney damage, retinal damage, and cardiovascular disease; Exercise goal of 150 minutes per week; Benefits of weight loss; Benefits of routine self-monitoring of blood sugar; Carbohydrate counting and/or plate method -Counseled to check feet daily and get yearly eye exams -Counseled on diet and exercise extensively Recommended to continue current medication [place]ated with [place] [facility] [place] for updated prescription sent for Xigduo XR.     Patient Goals/Self-Care Activities Patient will: - take medications as prescribed focus on medication adherence by using pill box check glucose daily, document, and provide at future appointments target a minimum of 150 minutes of moderate intensity exercise weekly  Follow Up Plan: Telephone follow up appointment with care management team member scheduled for: ~06/2021       Medication Assistance:  Xigduo obtained through [facility] Me medication assistance program.  Enrollment ends 06/15/2021  Compliance/Adherence/Medication fill history: Care Gaps: ED visit per thousand  Star-Rating Drugs: Xigduo - through patient assistance Losartan 100 mg daily - 09/17/2020 - 90 day supply Simvastatin 40 mg daily - 10/18/2020 - 90 day supply   Patient's preferred pharmacy is:  [facility] [address], [place] - [place] [address] [place] [address] Phone: [phone] Fax: [place], [place] [place] [address] Phone: [phone] Fax: [phone]  Uses pill box? Yes Pt endorses 100% compliance  We discussed: Benefits of medication synchronization, packaging and delivery as well as enhanced pharmacist oversight with [employer]. Patient decided to: Continue current medication  management strategy  Care Plan and Follow Up Patient Decision:  Patient agrees to Care Plan and Follow-up.  Plan: Telephone follow up appointment with care management team member scheduled for:  ~6 months

## 2021-01-14 NOTE — Progress Notes (Signed)
    Chronic Care Management Pharmacy Assistant   Name: Alicia Jordan  MRN: [id number] DOB: December 24, 1945    Reason for Encounter: Medication Review for Xigduo 10/500 from PAP   Medications: Outpatient Encounter Medications as of 01/14/2021  Medication Sig   Acetaminophen (TYLENOL PO) Take by mouth as needed.   Cetirizine HCl (ZYRTEC ALLERGY) 10 MG CAPS Take by mouth daily.   Cholecalciferol (VITAMIN D3) 25 MCG (1000 UT) CAPS Take 1 capsule by mouth daily.   diphenhydrAMINE (BENADRYL) 25 mg capsule Take 25 mg by mouth every 6 (six) hours as needed.   Ferrous Sulfate (IRON PO) Take 2 tablets by mouth daily in the afternoon.   FLUoxetine (PROZAC) 20 MG capsule TAKE 3 CAPSULES BY MOUTH ONCE DAILY   losartan (COZAAR) 100 MG tablet Take 1 tablet by mouth once daily   pantoprazole (PROTONIX) 40 MG tablet Take 1 tablet (40 mg total) by mouth daily.   simvastatin (ZOCOR) 40 MG tablet TAKE 1 TABLET BY MOUTH AT BEDTIME   Facility-Administered Encounter Medications as of 01/14/2021  Medication   triamcinolone acetonide (KENALOG-40) injection 80 mg   Donette Larry CPP asked me to call [employer] to confirm the delivery of Xigduo for the 10/500 dosing.  I called, the representative stated the next shipment is due 02/09/21.  Donette Larry, CPP noted it was ok to give a verbal for this request to the pharmacy.   I have notified Donette Larry, CPP to make sure that sounded correct.    Clarita Leber, [place] Pharmacist Assistant [phone]

## 2021-01-16 ENCOUNTER — Telehealth: Payer: Self-pay | Admitting: Oncology

## 2021-01-16 NOTE — Telephone Encounter (Signed)
Patient referred by Dr Reinaldo Meeker for Iron Def/Anemia (Only see results for Anemia).  Appt made for 02/01/21 Labs 10:45 am - Consult 11:15 am

## 2021-01-20 NOTE — Patient Instructions (Signed)
Visit Information   Goals Addressed   None    Patient Care Plan: CCM Pharmacy Care Plan     Problem Identified: htn, hld, dm   Priority: High  Onset Date: 11/13/2020     Long-Range Goal: Disease State Management   Start Date: 11/13/2020  Expected End Date: 11/13/2021  Recent Progress: On track  Priority: High  Note:   Current Barriers: Unable to independently afford treatment regimen   Pharmacist Clinical Goal(s):  Patient will verbalize ability to afford treatment regimen through collaboration with PharmD and provider.   Interventions: 1:1 collaboration with [name], MD regarding development and update of comprehensive plan of care as evidenced by provider attestation and co-signature Inter-disciplinary care team collaboration (see longitudinal plan of care) Comprehensive medication review performed; medication list updated in electronic medical record   Hypertension (BP goal <130/80) -Controlled. Diagnosed >25 years ago.  -Current treatment: Losartan 100 mg daily -Medications previously tried: Losartan-hydrochlorothiazide -Current home readings: well controlled -Current dietary habits: eats lunch out and working to [employer] -Current exercise habits: started [place] [employer] -Denies hypotensive/hypertensive symptoms -Educated on BP goals and benefits of medications for prevention of heart attack, stroke and kidney damage; Daily salt intake goal < 2300 mg; Exercise goal of 150 minutes per week; -Counseled to monitor BP at home as needed, document, and provide log at future appointments -Counseled on diet and exercise extensively Recommended to continue current medication   Hyperlipidemia: (LDL goal < 70) -Not ideally controlled -Current treatment: Simvastatin 40 mg daily at bedtime - consider atorvastatin at next blood work due to rental impairment limiting  -Medications previously tried: none reported -Current dietary patterns: eats out at  lunch. Trying to make smarter decisions -Current exercise habits: [place] [employer] [place] -Educated on Cholesterol goals; Benefits of statin for ASCVD risk reduction; Importance of limiting foods high in cholesterol; Exercise goal of 150 minutes per week; -Counseled on diet and exercise extensively Recommended to continue current medication   Diabetes (A1c goal <7%) -Not ideally controlled -Current medications: Xigduo XR 10/500 mg daily -Medications previously tried: [name] -Current home glucose readings fasting glucose: ~130 mg/dL post prandial glucose: not checking -Denies hypoglycemic/hyperglycemic symptoms -Current meal patterns: Trying to make smart choices. Eats out with daughter during lunch some and sandwich or simple lunch other days.Cereal for breakfast.  -Current exercise: Had [place] [employer] [place] but unable to currently due to anemia.  -Educated on A1c and blood sugar goals; Complications of diabetes including kidney damage, retinal damage, and cardiovascular disease; Exercise goal of 150 minutes per week; Benefits of weight loss; Benefits of routine self-monitoring of blood sugar; Carbohydrate counting and/or plate method -Counseled to check feet daily and get yearly eye exams -Counseled on diet and exercise extensively Recommended to continue current medication Collaborated with [place] [facility] for updated prescription sent for Xigduo XR.     Patient Goals/Self-Care Activities Patient will: - take medications as prescribed focus on medication adherence by using pill box check glucose daily, document, and provide at future appointments target a minimum of 150 minutes of moderate intensity exercise weekly  Follow Up Plan: Telephone follow up appointment with care management team member scheduled for: ~06/2021      The patient verbalized understanding of instructions, educational materials, and care plan  provided today and declined offer to receive copy of patient instructions, educational materials, and care plan.  Telephone follow up appointment with  pharmacy team member scheduled for: ~6 months  Alicia Jordan, [facility]

## 2021-01-28 ENCOUNTER — Other Ambulatory Visit: Payer: Self-pay | Admitting: Physician Assistant

## 2021-01-29 ENCOUNTER — Ambulatory Visit: Payer: PPO | Admitting: Gastroenterology

## 2021-01-31 NOTE — Progress Notes (Signed)
[facility]  [address] [place],  [place]  [address] [phone]  Clinic Day:  02/01/2021  Referring physician: [name] MD   HISTORY OF PRESENT ILLNESS:  The patient is a 75 y.o. female who I was asked to consult upon for anemia.  Recent labs at his primary care office showed a low hemoglobin of 9.7, with a low MCV of 79.  As these findings were suggestive of iron deficiency, the patient has been taking 2 iron pills daily for the past 8 weeks.  She denies having any overt forms of blood loss to explain her anemia.  She did have a colonoscopy in 2021, which did not show any adverse GI tract pathology. She is scheduled for a GI evaluation next week.   PAST MEDICAL HISTORY:   Past Medical History:  Diagnosis Date   Allergy    Anemia    Anxiety    Arthralgia of left temporomandibular joint    Arthritis    Cervicalgia    Chronic kidney disease, stage 3a (HCC)    Depression    Diabetes mellitus without complication ([place])    Hesitancy of micturition    Hyperlipidemia    Hypertension    Osteoporosis    Other psoriasis    Other specified menopausal and perimenopausal disorders    Overweight    Repeated falls    Residual hemorrhoidal skin tags     PAST SURGICAL HISTORY:   Past Surgical History:  Procedure Laterality Date   ABDOMINAL HYSTERECTOMY     APPENDECTOMY     CARPAL TUNNEL RELEASE Right    CHOLECYSTECTOMY     COLONOSCOPY  04/09/2016   Mild sigmoid diverticulosis. Otherwise normal colonoscopy   ESOPHAGOGASTRODUODENOSCOPY  09/10/2012   No obvious esophageal stricture, status post empiric esophageal dilation. Irregular GE junction, questionable significance. Mild gastritis.   eye lid surgery     KNEE SURGERY Left     CURRENT MEDICATIONS:   Current Outpatient Medications  Medication Sig Dispense Refill   Acetaminophen (TYLENOL PO) Take by mouth as needed.     Cetirizine HCl (ZYRTEC ALLERGY) 10 MG CAPS Take by mouth daily.      Cholecalciferol (VITAMIN D3) 25 MCG (1000 UT) CAPS Take 1 capsule by mouth daily.     diphenhydrAMINE (BENADRYL) 25 mg capsule Take 25 mg by mouth every 6 (six) hours as needed.     Ferrous Sulfate (IRON PO) Take 2 tablets by mouth daily in the afternoon.     FLUoxetine (PROZAC) 20 MG capsule TAKE 3 CAPSULES BY MOUTH ONCE DAILY 270 capsule 0   losartan (COZAAR) 100 MG tablet Take 1 tablet by mouth once daily 90 tablet 0   pantoprazole (PROTONIX) 40 MG tablet Take 1 tablet (40 mg total) by mouth daily. 90 tablet 3   simvastatin (ZOCOR) 40 MG tablet TAKE 1 TABLET BY MOUTH AT BEDTIME 90 tablet 0   Current Facility-Administered Medications  Medication Dose Route Frequency Provider Last Rate Last Admin   0.9 %  sodium chloride infusion  500 mL Intravenous Once Jackquline Denmark, MD       triamcinolone acetonide (KENALOG-40) injection 80 mg  80 mg Intra-articular Once CoxElnita Maxwell, MD        ALLERGIES:   Allergies  Allergen Reactions   Meloxicam     FAMILY HISTORY:   Family History  Problem Relation Age of Onset   Diabetes type II Mother    Hyperlipidemia Mother    Hypertension Mother  Heart attack Father    Hyperlipidemia Father    Hypertension Father    Stroke Father    Diabetes type II Father    Colon cancer Daughter 76   Cancer Daughter    Esophageal cancer Neg Hx    Rectal cancer Neg Hx    Stomach cancer Neg Hx     SOCIAL HISTORY:  The patient was born and raised in [place].  She lives in town with her husband of 51 years.  She has 4 children, 10 grandchildren and 1 great grandchild.  She was a sewing Glass blower/designer.  There is no history of alcohol or tobacco abuse.    REVIEW OF SYSTEMS:  Review of Systems  Constitutional:  Positive for fatigue. Negative for fever.  HENT:   Negative for hearing loss and sore throat.   Eyes:  Negative for eye problems.  Respiratory:  Negative for chest tightness, cough and hemoptysis.   Cardiovascular:  Negative for chest  pain and palpitations.  Gastrointestinal:  Negative for abdominal distention, abdominal pain, blood in stool, constipation, diarrhea, nausea and vomiting.  Endocrine: Negative for hot flashes.  Genitourinary:  Negative for difficulty urinating, dysuria, frequency, hematuria and nocturia.   Musculoskeletal:  Positive for arthralgias. Negative for back pain, gait problem and myalgias.  Skin: Negative.  Negative for itching and rash.  Neurological:  Positive for headaches. Negative for dizziness, extremity weakness, gait problem, light-headedness and numbness.  Hematological: Negative.   Psychiatric/Behavioral:  Negative for depression and suicidal ideas. The patient is nervous/anxious.     PHYSICAL EXAM:  Blood pressure (!) 164/75, pulse 67, temperature 98.7 F (37.1 C), resp. rate 14, height '5\' 2"'$  (1.575 m), weight 146 lb 4.8 oz (66.4 kg), SpO2 99 %. Wt Readings from Last 3 Encounters:  02/06/21 147 lb (66.7 kg)  02/01/21 146 lb 4.8 oz (66.4 kg)  01/02/21 147 lb 2 oz (66.7 kg)   Body mass index is 26.76 kg/m. Performance status (ECOG): 1 - Symptomatic but completely ambulatory Physical Exam Constitutional:      Appearance: Normal appearance. She is not ill-appearing.  HENT:     Mouth/Throat:     Mouth: Mucous membranes are moist.     Pharynx: Oropharynx is clear. No oropharyngeal exudate or posterior oropharyngeal erythema.  Cardiovascular:     Rate and Rhythm: Normal rate and regular rhythm.     Heart sounds: No murmur heard.   No friction rub. No gallop.  Pulmonary:     Effort: Pulmonary effort is normal. No respiratory distress.     Breath sounds: Normal breath sounds. No wheezing, rhonchi or rales.  Abdominal:     General: Bowel sounds are normal. There is no distension.     Palpations: Abdomen is soft. There is no mass.     Tenderness: There is no abdominal tenderness.  Musculoskeletal:        General: No swelling.     Right lower leg: No edema.     Left lower leg: No  edema.  Lymphadenopathy:     Cervical: No cervical adenopathy.     Upper Body:     Right upper body: No supraclavicular or axillary adenopathy.     Left upper body: No supraclavicular or axillary adenopathy.     Lower Body: No right inguinal adenopathy. No left inguinal adenopathy.  Skin:    General: Skin is warm.     Coloration: Skin is not jaundiced.     Findings: No lesion or rash.  Neurological:  General: No focal deficit present.     Mental Status: She is alert and oriented to person, place, and time. Mental status is at baseline.     Cranial Nerves: Cranial nerves are intact.  Psychiatric:        Mood and Affect: Mood normal.        Behavior: Behavior normal.        Thought Content: Thought content normal.    LABS:   CBC Latest Ref Rng & Units 02/01/2021 12/20/2020 12/14/2020  WBC - 5.7 4.3 5.0  Hemoglobin 12.0 - 16.0 13.0 9.7(L) 9.6(L)  Hematocrit 36 - 46 41 32.0(L) 32.6(L)  Platelets 150 - 399 156 206 191   CMP Latest Ref Rng & Units 02/01/2021 12/14/2020 10/09/2020  Glucose 65 - 99 mg/dL - 120(H) 133(H)  BUN 4 - 21 24(A) 22 27  Creatinine 0.5 - 1.1 1.5(A) 1.65(H) 1.46(H)  Sodium 137 - 147 139 139 142  Potassium 3.4 - 5.3 4.3 4.3 4.9  Chloride 99 - 108 105 104 107(H)  CO2 13 - 22 21 19(L) 20  Calcium 8.7 - 10.7 9.6 9.5 9.4  Total Protein 6.0 - 8.5 g/dL - 6.7 6.6  Total Bilirubin 0.0 - 1.2 mg/dL - 1.0 0.7  Alkaline Phos 25 - 125 60 62 57  AST 13 - 35 '23 13 9  '$ ALT 7 - 35 '13 11 6    '$ Ref. Range 02/01/2021 10:31  Iron Latest Ref Range: 28 - 170 ug/dL 337 (H)  UIBC Latest Units: ug/dL 66  TIBC Latest Ref Range: 250 - 450 ug/dL 403  Saturation Ratios Latest Ref Range: 10.4 - 31.8 % 84 (H)  Ferritin Latest Ref Range: 11 - 307 ng/mL 21  Folate Latest Ref Range: >5.9 ng/mL 20.5  Vitamin B12 Latest Ref Range: 180 - 914 pg/mL 148 (L)   ASSESSMENT & PLAN:  A 75 y.o. female who I was asked to consult upon for iron deficiency anemia.  I am pleased with the significant  improvement in her hemoglobin since she started her oral iron.  I am fine with her continuing 2 iron pills daily to ensure adequate fortification of her iron stores.  Of note, labs show this patient is also B12 deficient.  Based upon this, I will arrange for her to receive a protracted course of B12 injections, which will include 1000 mcg daily x 7 days, then weekly x 4 weeks, then monthly indefinitely.  I will see her back in 4 months to reassess her iron, B12 and hemoglobin levels.  The patient understands all the plans discussed today and is in agreement with them.  I do appreciate Dr [name] for his new consult.   Latha Staunton Macarthur Critchley, MD

## 2021-02-01 ENCOUNTER — Inpatient Hospital Stay (INDEPENDENT_AMBULATORY_CARE_PROVIDER_SITE_OTHER): Payer: PPO | Admitting: Oncology

## 2021-02-01 ENCOUNTER — Encounter: Payer: Self-pay | Admitting: Oncology

## 2021-02-01 ENCOUNTER — Other Ambulatory Visit: Payer: Self-pay

## 2021-02-01 ENCOUNTER — Other Ambulatory Visit: Payer: Self-pay | Admitting: Oncology

## 2021-02-01 ENCOUNTER — Inpatient Hospital Stay: Payer: PPO | Attending: Oncology

## 2021-02-01 DIAGNOSIS — E538 Deficiency of other specified B group vitamins: Secondary | ICD-10-CM | POA: Insufficient documentation

## 2021-02-01 DIAGNOSIS — D509 Iron deficiency anemia, unspecified: Secondary | ICD-10-CM | POA: Insufficient documentation

## 2021-02-01 DIAGNOSIS — Z809 Family history of malignant neoplasm, unspecified: Secondary | ICD-10-CM

## 2021-02-01 DIAGNOSIS — Z800 Family history of malignant neoplasm of digestive organs: Secondary | ICD-10-CM

## 2021-02-01 DIAGNOSIS — D649 Anemia, unspecified: Secondary | ICD-10-CM

## 2021-02-01 DIAGNOSIS — D508 Other iron deficiency anemias: Secondary | ICD-10-CM

## 2021-02-01 LAB — BASIC METABOLIC PANEL
BUN: 24 — AB (ref 4–21)
CO2: 21 (ref 13–22)
Chloride: 105 (ref 99–108)
Creatinine: 1.5 — AB (ref 0.5–1.1)
Glucose: 134
Potassium: 4.3 (ref 3.4–5.3)
Sodium: 139 (ref 137–147)

## 2021-02-01 LAB — COMPREHENSIVE METABOLIC PANEL
Albumin: 4.6 (ref 3.5–5.0)
Calcium: 9.6 (ref 8.7–10.7)

## 2021-02-01 LAB — VITAMIN B12: Vitamin B-12: 148 pg/mL — ABNORMAL LOW (ref 180–914)

## 2021-02-01 LAB — IRON AND TIBC
Iron: 337 ug/dL — ABNORMAL HIGH (ref 28–170)
Saturation Ratios: 84 % — ABNORMAL HIGH (ref 10.4–31.8)
TIBC: 403 ug/dL (ref 250–450)
UIBC: 66 ug/dL

## 2021-02-01 LAB — HEPATIC FUNCTION PANEL
ALT: 13 (ref 7–35)
AST: 23 (ref 13–35)
Alkaline Phosphatase: 60 (ref 25–125)
Bilirubin, Total: 1.1

## 2021-02-01 LAB — FOLATE: Folate: 20.5 ng/mL (ref >5.9)

## 2021-02-01 LAB — CBC: RBC: 4.84 (ref 3.87–5.11)

## 2021-02-01 LAB — CBC AND DIFFERENTIAL
HCT: 41 (ref 36–46)
Hemoglobin: 13 (ref 12.0–16.0)
Neutrophils Absolute: 3.42
Platelets: 156 (ref 150–399)
WBC: 5.7

## 2021-02-01 LAB — FERRITIN: Ferritin: 21 ng/mL (ref 11–307)

## 2021-02-06 ENCOUNTER — Other Ambulatory Visit: Payer: Self-pay

## 2021-02-06 ENCOUNTER — Encounter: Payer: Self-pay | Admitting: Gastroenterology

## 2021-02-06 ENCOUNTER — Ambulatory Visit (AMBULATORY_SURGERY_CENTER): Payer: PPO | Admitting: Gastroenterology

## 2021-02-06 VITALS — BP 146/86 | HR 76 | Temp 97.8°F | Resp 13 | Ht 62.0 in | Wt 147.0 lb

## 2021-02-06 DIAGNOSIS — E785 Hyperlipidemia, unspecified: Secondary | ICD-10-CM | POA: Diagnosis not present

## 2021-02-06 DIAGNOSIS — K317 Polyp of stomach and duodenum: Secondary | ICD-10-CM | POA: Diagnosis not present

## 2021-02-06 DIAGNOSIS — R1310 Dysphagia, unspecified: Secondary | ICD-10-CM

## 2021-02-06 DIAGNOSIS — K5730 Diverticulosis of large intestine without perforation or abscess without bleeding: Secondary | ICD-10-CM

## 2021-02-06 DIAGNOSIS — N183 Chronic kidney disease, stage 3 unspecified: Secondary | ICD-10-CM | POA: Diagnosis not present

## 2021-02-06 DIAGNOSIS — K2970 Gastritis, unspecified, without bleeding: Secondary | ICD-10-CM

## 2021-02-06 DIAGNOSIS — K319 Disease of stomach and duodenum, unspecified: Secondary | ICD-10-CM | POA: Diagnosis not present

## 2021-02-06 DIAGNOSIS — K219 Gastro-esophageal reflux disease without esophagitis: Secondary | ICD-10-CM

## 2021-02-06 DIAGNOSIS — K640 First degree hemorrhoids: Secondary | ICD-10-CM | POA: Diagnosis not present

## 2021-02-06 DIAGNOSIS — D509 Iron deficiency anemia, unspecified: Secondary | ICD-10-CM

## 2021-02-06 DIAGNOSIS — Z800 Family history of malignant neoplasm of digestive organs: Secondary | ICD-10-CM | POA: Diagnosis not present

## 2021-02-06 DIAGNOSIS — I129 Hypertensive chronic kidney disease with stage 1 through stage 4 chronic kidney disease, or unspecified chronic kidney disease: Secondary | ICD-10-CM | POA: Diagnosis not present

## 2021-02-06 MED ORDER — SODIUM CHLORIDE 0.9 % IV SOLN: 500.0000 mL | Freq: Once | INTRAVENOUS | Status: DC

## 2021-02-06 NOTE — Op Note (Signed)
[facility] Patient Name: Alicia Jordan Procedure Date: 02/06/2021 7:41 AM MRN: [id number] Endoscopist: Jackquline Denmark , MD Age: 75 Referring MD:  Date of Birth: 1945/10/05 Gender: Female Account #: [id number] Procedure:                Colonoscopy Indications:              Unexplained iron deficiency anemia. Intermittent                            diarrhea. FH of colon cancer (daughter) Medicines:                Monitored Anesthesia Care Procedure:                Pre-Anesthesia Assessment:                           - Prior to the procedure, a History and Physical                            was performed, and patient medications and                            allergies were reviewed. The patient's tolerance of                            previous anesthesia was also reviewed. The risks                            and benefits of the procedure and the sedation                            options and risks were discussed with the patient.                            All questions were answered, and informed consent                            was obtained. Prior Anticoagulants: The patient has                            taken no previous anticoagulant or antiplatelet                            agents. ASA Grade Assessment: II - A patient with                            mild systemic disease. After reviewing the risks                            and benefits, the patient was deemed in                            satisfactory condition to undergo the procedure.  After obtaining informed consent, the colonoscope                            was passed under direct vision. Throughout the                            procedure, the patient's blood pressure, pulse, and                            oxygen saturations were monitored continuously. The                            Olympus [id number]) Colonoscope was                            introduced through the  anus and advanced to the 2                            cm into the ileum. The colonoscopy was performed                            without difficulty. The patient tolerated the                            procedure well. The quality of the bowel                            preparation was good. The terminal ileum, ileocecal                            valve, appendiceal orifice, and rectum were                            photographed. Scope In: 8:24:44 AM Scope Out: 8:38:39 AM Scope Withdrawal Time: 0 hours 8 minutes 20 seconds  Total Procedure Duration: 0 hours 13 minutes 55 seconds  Findings:                 The colon (entire examined portion) appeared                            normal. Biopsies for histology were taken with a                            cold forceps from the entire colon for evaluation                            of microscopic colitis.                           A few medium-mouthed diverticula were found in the                            sigmoid colon.  Non-bleeding external and internal hemorrhoids were                            found during retroflexion and during perianal exam.                            The hemorrhoids were small and Grade I (internal                            hemorrhoids that do not prolapse).                           The terminal ileum appeared normal.                           The exam was otherwise without abnormality on                            direct and retroflexion views. Complications:            No immediate complications. Estimated Blood Loss:     Estimated blood loss: none. Impression:               - Mild sigmoid diverticulosis.                           - Non-bleeding external and internal hemorrhoids.                           - The examined portion of the ileum was normal.                           - The examination was otherwise normal on direct                            and retroflexion  views. Recommendation:           - Patient has a contact number available for                            emergencies. The signs and symptoms of potential                            delayed complications were discussed with the                            patient. Return to normal activities tomorrow.                            Written discharge instructions were provided to the                            patient.                           - Resume previous diet.                           -  Continue present medications.                           - Await pathology results.                           - Repeat colonoscopy is not recommended for                            screening purposes.                           - The findings and recommendations were discussed                            with the patient's family. Jackquline Denmark, MD 02/06/2021 8:42:58 AM This report has been signed electronically.

## 2021-02-06 NOTE — Progress Notes (Signed)
Called to room to assist during endoscopic procedure.  Patient ID and intended procedure confirmed with present staff. Received instructions for my participation in the procedure from the performing physician.  

## 2021-02-06 NOTE — Progress Notes (Signed)
Chief Complaint: Anemia  Referring Provider:  [name], MD      ASSESSMENT AND PLAN;   #1. IDA with hem + stools  #2. GERD with occ dysphaia  #3. FH colon Ca (daughter at age 73)  #21. DM2 with CKD3 (Dr Harrie Jeans, GFR 33 ml/min) with some element of anemia of chronic disease.  Plan: -EGD/colon -Check CBC, CMP, iron studies at time of endoscopic procedures.    I discussed the nature of the recommended EGD/Colonoscopy , as well as the indications, risks, alternatives and potential complications including, but not limited to, bleeding, infection, reaction to medication, damage to internal organs, cardiac and/or pulmonary problems, and perforation requiring surgery (1 to 2 in 1000). The possibility that significant findings could be missed was explained. All ? were answered. The patient gives consent for the procedures.  HPI:    Alicia Jordan is a 75 y.o. female  With diabetes, hypertension, CKD3, anxiety/depression and hyperlipidemia  For preop eval for R TKR  Found to be anemic with hemoglobin 9.6 (baseline 12 09/2020, 3 months ago) with 1/3 heme + stools.  Started on iron supplements.  Seen as an emergency workin.  She does feel tired and fatigued.  No history of pica to ice.  Has been having occasional dysphagia mostly to solids-mid chest.  No heartburn but has been taking Protonix 40 mg p.o. BID.  No odynophagia.  No melena or hematochezia.  History of chronic longstanding intermittent diarrhea which is better now since she has been off metformin.  No weight loss.  No jaundice dark urine or pale stools.  No fever chills or night sweats.  She is being followed by Dr. Harrie Jeans for CKD3.  No sodas, chocolates, chewing gums, artificial sweeteners and candy. No NSAIDs   Wt Readings from Last 3 Encounters:  02/06/21 147 lb (66.7 kg)  02/01/21 146 lb 4.8 oz (66.4 kg)  01/02/21 147 lb 2 oz (66.7 kg)   Past GI procedures:  EGD 08/2012 by Dr.  Lyda Jester -Irregular Z-line at 36 cm.  Negative esophageal biopsies for Barrett's. And neg for eosinophilic esophagitis -Mild gastritis -S/P dilatation with 80 F. Maloney  Colonoscopy 05/2019 (FH colon cancer in daughter at age 69) -Mild sigmoid diverticulosis -Internal hemorrhoids -Otherwise normal to TI. -Random colon biopsies showed focal active colitis.  Also had EGD 04/2009, 2003 under procedures tab.  Past Medical History:  Diagnosis Date   Allergy    Anemia    Anxiety    Arthralgia of left temporomandibular joint    Arthritis    Cervicalgia    Chronic kidney disease, stage 3a (HCC)    Depression    Diabetes mellitus without complication ([place])    Hesitancy of micturition    Hyperlipidemia    Hypertension    Osteoporosis    Other psoriasis    Other specified menopausal and perimenopausal disorders    Overweight    Repeated falls    Residual hemorrhoidal skin tags     Past Surgical History:  Procedure Laterality Date   ABDOMINAL HYSTERECTOMY     APPENDECTOMY     CARPAL TUNNEL RELEASE Right    CHOLECYSTECTOMY     COLONOSCOPY  04/09/2016   Mild sigmoid diverticulosis. Otherwise normal colonoscopy   ESOPHAGOGASTRODUODENOSCOPY  09/10/2012   No obvious esophageal stricture, status post empiric esophageal dilation. Irregular GE junction, questionable significance. Mild gastritis.   eye lid surgery     KNEE SURGERY Left     Family History  Problem Relation Age of Onset   Diabetes type II Mother    Hyperlipidemia Mother    Hypertension Mother    Heart attack Father    Hyperlipidemia Father    Hypertension Father    Stroke Father    Diabetes type II Father    Colon cancer Daughter 58   Cancer Daughter    Esophageal cancer Neg Hx    Rectal cancer Neg Hx    Stomach cancer Neg Hx     Social History   Tobacco Use   Smoking status: Never   Smokeless tobacco: Never  Vaping Use   Vaping Use: Never used  Substance Use Topics   Alcohol use: Never   Drug  use: Never    Current Outpatient Medications  Medication Sig Dispense Refill   Acetaminophen (TYLENOL PO) Take by mouth as needed.     Cetirizine HCl (ZYRTEC ALLERGY) 10 MG CAPS Take by mouth daily.     Cholecalciferol (VITAMIN D3) 25 MCG (1000 UT) CAPS Take 1 capsule by mouth daily.     FLUoxetine (PROZAC) 20 MG capsule TAKE 3 CAPSULES BY MOUTH ONCE DAILY 270 capsule 0   losartan (COZAAR) 100 MG tablet Take 1 tablet by mouth once daily 90 tablet 0   pantoprazole (PROTONIX) 40 MG tablet Take 1 tablet (40 mg total) by mouth daily. 90 tablet 3   simvastatin (ZOCOR) 40 MG tablet TAKE 1 TABLET BY MOUTH AT BEDTIME 90 tablet 0   diphenhydrAMINE (BENADRYL) 25 mg capsule Take 25 mg by mouth every 6 (six) hours as needed.     Ferrous Sulfate (IRON PO) Take 2 tablets by mouth daily in the afternoon.     Current Facility-Administered Medications  Medication Dose Route Frequency Provider Last Rate Last Admin   0.9 %  sodium chloride infusion  500 mL Intravenous Once Jackquline Denmark, MD       triamcinolone acetonide (KENALOG-40) injection 80 mg  80 mg Intra-articular Once Cox, Kirsten, MD        Allergies  Allergen Reactions   Meloxicam     Review of Systems:  Constitutional: Denies fever, chills, diaphoresis, appetite change and has fatigue.  HEENT: Denies photophobia, eye pain, redness, hearing loss, ear pain, congestion, sore throat, rhinorrhea, sneezing, mouth sores, neck pain, neck stiffness and tinnitus.   Respiratory: Denies SOB, DOE, cough, chest tightness,  and wheezing.   Cardiovascular: Denies chest pain, palpitations and leg swelling.  Genitourinary: Denies dysuria, urgency, frequency, hematuria, flank pain and difficulty urinating.  Musculoskeletal: Denies myalgias, back pain, joint swelling, arthralgias and gait problem.  Skin: No rash.  Neurological: Denies dizziness, seizures, syncope, weakness, light-headedness, numbness and headaches.  Hematological: Denies adenopathy. Easy  bruising, personal or family bleeding history  Psychiatric/Behavioral: Has anxiety or depression     Physical Exam:    BP (!) 150/82   Pulse 83   Temp 97.8 F (36.6 C)   Resp 16   Ht '5\' 2"'$  (1.575 m)   Wt 147 lb (66.7 kg)   SpO2 100%   BMI 26.89 kg/m  Wt Readings from Last 3 Encounters:  02/06/21 147 lb (66.7 kg)  02/01/21 146 lb 4.8 oz (66.4 kg)  01/02/21 147 lb 2 oz (66.7 kg)   Constitutional:  Well-developed, in no acute distress. Psychiatric: Normal mood and affect. Behavior is normal. HEENT: Pupils normal.  Conjunctivae are normal. No scleral icterus. Neck supple.  Cardiovascular: Normal rate, regular rhythm. No edema Pulmonary/chest: Effort normal and breath sounds normal. No wheezing, rales  or rhonchi. Abdominal: Soft, nondistended. Nontender. Bowel sounds active throughout. There are no masses palpable. No hepatomegaly. Rectal: Deferred Neurological: Alert and oriented to person place and time. Skin: Skin is warm and dry. No rashes noted.  Data Reviewed: I have personally reviewed following labs and imaging studies  CBC: CBC Latest Ref Rng & Units 02/01/2021 12/20/2020 12/14/2020  WBC - 5.7 4.3 5.0  Hemoglobin 12.0 - 16.0 13.0 9.7(L) 9.6(L)  Hematocrit 36 - 46 41 32.0(L) 32.6(L)  Platelets 150 - 399 156 206 191    CMP: CMP Latest Ref Rng & Units 02/01/2021 12/14/2020 10/09/2020  Glucose 65 - 99 mg/dL - 120(H) 133(H)  BUN 4 - 21 24(A) 22 27  Creatinine 0.5 - 1.1 1.5(A) 1.65(H) 1.46(H)  Sodium 137 - 147 139 139 142  Potassium 3.4 - 5.3 4.3 4.3 4.9  Chloride 99 - 108 105 104 107(H)  CO2 13 - 22 21 19(L) 20  Calcium 8.7 - 10.7 9.6 9.5 9.4  Total Protein 6.0 - 8.5 g/dL - 6.7 6.6  Total Bilirubin 0.0 - 1.2 mg/dL - 1.0 0.7  Alkaline Phos 25 - 125 60 62 57  AST 13 - 35 '23 13 9  '$ ALT 7 - 35 '13 11 6    '$ No change    Carmell Austria, MD 02/06/2021, 8:08 AM  Cc: [name], MD

## 2021-02-06 NOTE — Patient Instructions (Signed)
*Lower-Resume previous diet and medications. Awaiting pathology results. Repeat colonoscopy not recommended for screening purposes.  *Upper-Follow post dilation diet. Continue present medications. No aspirin, Ibuprofen, Naproxen, or other non-steroidal anti-inflammatory drugs for 5 days after polyp removal. Awaiting pathology results.  YOU HAD AN ENDOSCOPIC PROCEDURE TODAY AT [facility]:   Refer to the procedure report that was given to you for any specific questions about what was found during the examination.  If the procedure report does not answer your questions, please call your gastroenterologist to clarify.  If you requested that your care partner not be given the details of your procedure findings, then the procedure report has been included in a sealed envelope for you to review at your convenience later.  YOU SHOULD EXPECT: Some feelings of bloating in the abdomen. Passage of more gas than usual.  Walking can help get rid of the air that was put into your GI tract during the procedure and reduce the bloating. If you had a lower endoscopy (such as a colonoscopy or flexible sigmoidoscopy) you may notice spotting of blood in your stool or on the toilet paper. If you underwent a bowel prep for your procedure, you may not have a normal bowel movement for a few days.  Please Note:  You might notice some irritation and congestion in your nose or some drainage.  This is from the oxygen used during your procedure.  There is no need for concern and it should clear up in a day or so.  SYMPTOMS TO REPORT IMMEDIATELY:  Following lower endoscopy (colonoscopy or flexible sigmoidoscopy):  Excessive amounts of blood in the stool  Significant tenderness or worsening of abdominal pains  Swelling of the abdomen that is new, acute  Fever of 100F or higher  Following upper endoscopy (EGD)  Vomiting of blood or coffee ground material  New chest pain or pain under the shoulder  blades  Painful or persistently difficult swallowing  New shortness of breath  Fever of 100F or higher  Black, tarry-looking stools  For urgent or emergent issues, a gastroenterologist can be reached at any hour by calling [phone]. Do not use MyChart messaging for urgent concerns.    DIET:  We do recommend a small meal at first, but then you may proceed to your regular diet.  Drink plenty of fluids but you should avoid alcoholic beverages for 24 hours.  ACTIVITY:  You should plan to take it easy for the rest of today and you should NOT DRIVE or use heavy machinery until tomorrow (because of the sedation medicines used during the test).    FOLLOW UP: Our staff will call the number listed on your records 48-72 hours following your procedure to check on you and address any questions or concerns that you may have regarding the information given to you following your procedure. If we do not reach you, we will leave a message.  We will attempt to reach you two times.  During this call, we will ask if you have developed any symptoms of COVID 19. If you develop any symptoms (ie: fever, flu-like symptoms, shortness of breath, cough etc.) before then, please call [phone].  If you test positive for Covid 19 in the 2 weeks post procedure, please call and report this information to [place].    If any biopsies were taken you will be contacted by phone or by letter within the next 1-3 weeks.  Please call us at [phone] if you have not heard  about the biopsies in 3 weeks.    SIGNATURES/CONFIDENTIALITY: You and/or your care partner have signed paperwork which will be entered into your electronic medical record.  These signatures attest to the fact that that the information above on your After Visit Summary has been reviewed and is understood.  Full responsibility of the confidentiality of this discharge information lies with you and/or your care-partner.

## 2021-02-06 NOTE — Op Note (Signed)
[facility] Patient Name: Alicia Jordan Procedure Date: 02/06/2021 7:42 AM MRN: [id number] Endoscopist: Jackquline Denmark , MD Age: 75 Referring MD:  Date of Birth: 04/22/1946 Gender: Female Account #: [id number] Procedure:                Upper GI endoscopy Indications:              Dysphagia, IDA Medicines:                Monitored Anesthesia Care Procedure:                Pre-Anesthesia Assessment:                           - Prior to the procedure, a History and Physical                            was performed, and patient medications and                            allergies were reviewed. The patient's tolerance of                            previous anesthesia was also reviewed. The risks                            and benefits of the procedure and the sedation                            options and risks were discussed with the patient.                            All questions were answered, and informed consent                            was obtained. Prior Anticoagulants: The patient has                            taken no previous anticoagulant or antiplatelet                            agents. ASA Grade Assessment: II - A patient with                            mild systemic disease. After reviewing the risks                            and benefits, the patient was deemed in                            satisfactory condition to undergo the procedure.                           After obtaining informed consent, the endoscope was  passed under direct vision. Throughout the                            procedure, the patient's blood pressure, pulse, and                            oxygen saturations were monitored continuously. The                            GIF [id number] was introduced through the                            mouth, and advanced to the second part of duodenum.                            The upper GI endoscopy was accomplished  without                            difficulty. The patient tolerated the procedure                            well. Scope In: Scope Out: Findings:                 The examined esophagus was normal with well-defined                            Z-line at 34 cm, examined by NBI. The scope was                            withdrawn. Dilation was performed with a Maloney                            dilator with mild resistance at 50 Fr. esophageal                            biopsies were not performed since previous biopsies                            were negative for eosinophilic esophagitis.                           Localized mild inflammation characterized by                            erythema was found in the gastric antrum. Biopsies                            were taken with a cold forceps for histology.                           A single 15 mm pedunculated polyp with no bleeding  but erythematous multilobulated head (suggestive of                            previous bleeding-see pictures) was found in the                            gastric body. The polyp was removed with a hot                            snare and retrieved by Jabier Mutton net. Resection and                            retrieval were complete. 4-5 small 4 to 6 mm polyps                            were visualized in the distal body of the stomach                            which were not removed.                           The examined duodenum was normal. Biopsies for                            histology were taken with a cold forceps for                            evaluation of celiac disease. Complications:            No immediate complications. Estimated Blood Loss:     Estimated blood loss: none. Impression:               - Large gastric polyp. Resected and retrieved.                           - Mild gastritis.                           - S/P empiric esophageal dilatation. Recommendation:           -  Patient has a contact number available for                            emergencies. The signs and symptoms of potential                            delayed complications were discussed with the                            patient. Return to normal activities tomorrow.                            Written discharge instructions were provided to the  patient.                           - Post dilatation diet.                           - Continue present medications.                           - No aspirin, ibuprofen, naproxen, or other                            non-steroidal anti-inflammatory drugs for 5 days                            after polyp removal.                           - Await pathology results.                           - The findings and recommendations were discussed                            with the patient's family. Jackquline Denmark, MD 02/06/2021 8:48:52 AM This report has been signed electronically.

## 2021-02-06 NOTE — Progress Notes (Signed)
Report to PACU, RN, vss, BBS= Clear.  

## 2021-02-08 ENCOUNTER — Telehealth: Payer: Self-pay | Admitting: *Deleted

## 2021-02-08 NOTE — Telephone Encounter (Signed)
Have you developed a fever since your procedure? no  2.   Have you had an respiratory symptoms (SOB or cough) since your procedure? no  3.   Have you tested positive for COVID 19 since your procedure no  4.   Have you had any family members/close contacts diagnosed with the COVID 19 since your procedure?  no   If yes to any of these questions please route to Joylene John, RN and Joella Prince, RN  Follow up Call-  Call back number 02/06/2021 06/15/2019  Post procedure Call Back phone  # [phone]  Permission to leave phone message Yes Yes  Some recent data might be hidden     Patient questions:  Do you have a fever, pain , or abdominal swelling? No. Pain Score  0 *  Have you tolerated food without any problems? Yes.    Have you been able to return to your normal activities? Yes.    Do you have any questions about your discharge instructions: Diet   No. Medications  No. Follow up visit  No.  Do you have questions or concerns about your Care? No.  Actions: * If pain score is 4 or above: No action needed, pain <4.

## 2021-02-09 DIAGNOSIS — D509 Iron deficiency anemia, unspecified: Secondary | ICD-10-CM | POA: Insufficient documentation

## 2021-02-09 DIAGNOSIS — E538 Deficiency of other specified B group vitamins: Secondary | ICD-10-CM | POA: Insufficient documentation

## 2021-02-13 DIAGNOSIS — E119 Type 2 diabetes mellitus without complications: Secondary | ICD-10-CM | POA: Diagnosis not present

## 2021-02-20 ENCOUNTER — Encounter: Payer: Self-pay | Admitting: Gastroenterology

## 2021-03-07 ENCOUNTER — Telehealth: Payer: Self-pay

## 2021-03-07 NOTE — Chronic Care Management (AMB) (Addendum)
    Chronic Care Management Pharmacy Assistant   Name: LAPORCHA MARCHESI  MRN: [id number] DOB: Apr 01, 1946   Reason for Encounter: Disease State call for DM    Recent office visits:  None since 01/14/21  Recent consult visits:  02/06/21 Procedure Visit. Lou Miner MD. Endoscopic performed. Biopsy taking. Advised to not take Aspirin, Ibuprofen, Naproxen for 5 days.  02/01/21 Lavera Guise MD Oncology. Seen for Iron Deficiency. Ordered B12 injections which includes B12 1056mcg daily x 7 days then weekly x 4 and then monthly.  Hospital visits:  None in previous 6 months  Medications: Outpatient Encounter Medications as of 03/07/2021  Medication Sig   Acetaminophen (TYLENOL PO) Take by mouth as needed.   Cetirizine HCl (ZYRTEC ALLERGY) 10 MG CAPS Take by mouth daily.   Cholecalciferol (VITAMIN D3) 25 MCG (1000 UT) CAPS Take 1 capsule by mouth daily.   diphenhydrAMINE (BENADRYL) 25 mg capsule Take 25 mg by mouth every 6 (six) hours as needed.   Ferrous Sulfate (IRON PO) Take 2 tablets by mouth daily in the afternoon.   FLUoxetine (PROZAC) 20 MG capsule TAKE 3 CAPSULES BY MOUTH ONCE DAILY   losartan (COZAAR) 100 MG tablet Take 1 tablet by mouth once daily   pantoprazole (PROTONIX) 40 MG tablet Take 1 tablet (40 mg total) by mouth daily.   simvastatin (ZOCOR) 40 MG tablet TAKE 1 TABLET BY MOUTH AT BEDTIME   Facility-Administered Encounter Medications as of 03/07/2021  Medication   0.9 %  sodium chloride infusion   triamcinolone acetonide (KENALOG-40) injection 80 mg   Recent Relevant Labs: Lab Results  Component Value Date/Time   HGBA1C 7.1 (H) 12/14/2020 09:45 AM   HGBA1C 7.8 (H) 09/14/2020 09:43 AM   MICROALBUR 30 05/04/2020 09:13 AM   MICROALBUR 80 08/11/2019 09:50 AM    Kidney Function Lab Results  Component Value Date/Time   CREATININE 1.5 (A) 02/01/2021 12:00 AM   CREATININE 1.65 (H) 12/14/2020 09:45 AM   CREATININE 1.46 (H) 10/09/2020 10:33 AM   GFRNONAA 41  (L) 05/04/2020 09:35 AM   GFRAA 47 (L) 05/04/2020 09:35 AM     Current antihyperglycemic regimen:  Xigduo XR 10/500 mg 2 daily     Patient verbally confirms she is taking the above medications as directed.   What recent interventions/DTPs have been made to improve glycemic control:  Pt states no changes have been made  Have there been any recent hospitalizations or ED visits since last visit with CPP? No recent visits  Patient denies hypoglycemic symptoms, including None  Patient denies hyperglycemic symptoms, including none  How often are you checking your blood sugar? before breakfast  What are your blood sugars ranging? Between 130-150 03/07/21 (145) 03/06/21 (144 )  On insulin? No  During the week, how often does your blood glucose drop below 70? Never  Are you checking your feet daily/regularly? Yes  Adherence Review: Is the patient currently on a STATIN medication? Yes Is the patient currently on ACE/ARB medication? Yes Does the patient have >5 day gap between last estimated fill dates? No  Care Gaps: Last eye exam / Retinopathy Screening? Due since 02/08/21 Last Annual Wellness Visit? Done on 09/06/20 Last Diabetic Foot Exam? Next due on 12/14/21   Star Rating Drugs:  Medication:  Last Fill: Day Supply Simvastatin   01/29/21 90ds Losartan   12/11/20 [place]

## 2021-03-12 ENCOUNTER — Other Ambulatory Visit: Payer: Self-pay | Admitting: Physician Assistant

## 2021-03-12 DIAGNOSIS — H02824 Cysts of left upper eyelid: Secondary | ICD-10-CM | POA: Diagnosis not present

## 2021-03-12 DIAGNOSIS — L720 Epidermal cyst: Secondary | ICD-10-CM | POA: Diagnosis not present

## 2021-03-15 ENCOUNTER — Other Ambulatory Visit: Payer: Self-pay

## 2021-03-15 MED ORDER — PANTOPRAZOLE SODIUM 40 MG PO TBEC: 40.0000 mg | DELAYED_RELEASE_TABLET | Freq: Every day | ORAL | 1 refills | Status: DC

## 2021-03-18 DIAGNOSIS — M9903 Segmental and somatic dysfunction of lumbar region: Secondary | ICD-10-CM | POA: Diagnosis not present

## 2021-03-18 DIAGNOSIS — M5442 Lumbago with sciatica, left side: Secondary | ICD-10-CM | POA: Diagnosis not present

## 2021-03-18 DIAGNOSIS — M5136 Other intervertebral disc degeneration, lumbar region: Secondary | ICD-10-CM | POA: Diagnosis not present

## 2021-03-18 DIAGNOSIS — M9904 Segmental and somatic dysfunction of sacral region: Secondary | ICD-10-CM | POA: Diagnosis not present

## 2021-03-29 ENCOUNTER — Other Ambulatory Visit: Payer: Self-pay

## 2021-03-29 ENCOUNTER — Encounter: Payer: Self-pay | Admitting: Family Medicine

## 2021-03-29 ENCOUNTER — Ambulatory Visit (INDEPENDENT_AMBULATORY_CARE_PROVIDER_SITE_OTHER): Payer: PPO | Admitting: Family Medicine

## 2021-03-29 VITALS — BP 126/70 | HR 78 | Temp 97.2°F | Resp 16 | Ht 62.0 in | Wt 146.0 lb

## 2021-03-29 DIAGNOSIS — K219 Gastro-esophageal reflux disease without esophagitis: Secondary | ICD-10-CM | POA: Diagnosis not present

## 2021-03-29 DIAGNOSIS — E1121 Type 2 diabetes mellitus with diabetic nephropathy: Secondary | ICD-10-CM

## 2021-03-29 DIAGNOSIS — I129 Hypertensive chronic kidney disease with stage 1 through stage 4 chronic kidney disease, or unspecified chronic kidney disease: Secondary | ICD-10-CM | POA: Diagnosis not present

## 2021-03-29 DIAGNOSIS — E559 Vitamin D deficiency, unspecified: Secondary | ICD-10-CM

## 2021-03-29 DIAGNOSIS — E538 Deficiency of other specified B group vitamins: Secondary | ICD-10-CM | POA: Diagnosis not present

## 2021-03-29 DIAGNOSIS — D500 Iron deficiency anemia secondary to blood loss (chronic): Secondary | ICD-10-CM

## 2021-03-29 DIAGNOSIS — F330 Major depressive disorder, recurrent, mild: Secondary | ICD-10-CM

## 2021-03-29 DIAGNOSIS — I10 Essential (primary) hypertension: Secondary | ICD-10-CM

## 2021-03-29 DIAGNOSIS — E782 Mixed hyperlipidemia: Secondary | ICD-10-CM

## 2021-03-29 DIAGNOSIS — Z01818 Encounter for other preprocedural examination: Secondary | ICD-10-CM

## 2021-03-29 DIAGNOSIS — N1832 Hypertensive chronic kidney disease with stage 1 through stage 4 chronic kidney disease, or unspecified chronic kidney disease: Secondary | ICD-10-CM

## 2021-03-29 DIAGNOSIS — G5602 Carpal tunnel syndrome, left upper limb: Secondary | ICD-10-CM

## 2021-03-29 DIAGNOSIS — F331 Major depressive disorder, recurrent, moderate: Secondary | ICD-10-CM

## 2021-03-29 DIAGNOSIS — Z23 Encounter for immunization: Secondary | ICD-10-CM

## 2021-03-29 DIAGNOSIS — M170 Bilateral primary osteoarthritis of knee: Secondary | ICD-10-CM | POA: Diagnosis not present

## 2021-03-29 LAB — POCT UA - MICROALBUMIN: Microalbumin Ur, POC: 30 mg/L

## 2021-03-29 NOTE — Progress Notes (Signed)
Subjective:  Patient ID: Alicia Jordan, female    DOB: 01-03-1946  Age: 75 y.o. MRN: [id number]  Chief Complaint  Patient presents with   Diabetes   Hypertension   Hyperlipidemia    HPI Diabetes:  She is trying to eat healthy and low carbs diet. She checks her blood sugar daily and the blood sugar are 164's  mg/dl range. She denied any low sugars and she checks her foot daily and she goes to annual eye exam. Last HBA1C was 7.1. Tolerating xigduo xr 10/500 mg 2 daily.   Hypertension: Patient takes losartan 100 mg daily.  Hyperlipidemia: Currently taking simvastatin 40 mg daily.   Vitamin D: She takes vitamin D3 OTC 1 capsule by mouth daily.  BL knee and left hand pain. Left sided carpal tunnel syndrome. Lost strength in her left hand. Failed with carpal tunnel brace. Per pt Dr. Sherley Bounds performed her rt carpal tunnel surgery. He had recommended she see a hand surgeon for her right hand. Unable to walk for exercise due to knee pain. Pt saw Dr. Ronnie Derby for her knees. Using tylenol. TKR has been delayed twice.   Anemia: The last time due to anemia 12/20/2020.  C/w b12 deficiency and iron deficiency.  EGD (02/06/2021) - Large gastric polyp. Resected and retrieved. - Mild gastritis. - S/P empiric esophageal dilatation.  COLONOSCOPY (02/06/2021) - Mild sigmoid diverticulosis. - Non-bleeding external and internal hemorrhoids. - The examined portion of the ileum was normal. - The examination was otherwise normal on direct and retroflexion views.  Current Outpatient Medications on File Prior to Visit  Medication Sig Dispense Refill   Acetaminophen (TYLENOL PO) Take by mouth as needed.     Cetirizine HCl (ZYRTEC ALLERGY) 10 MG CAPS Take by mouth daily.     Cholecalciferol (VITAMIN D3) 25 MCG (1000 UT) CAPS Take 1 capsule by mouth daily.     diphenhydrAMINE (BENADRYL) 25 mg capsule Take 25 mg by mouth every 6 (six) hours as needed.     Ferrous Sulfate (IRON PO) Take 2 tablets by  mouth daily in the afternoon.     FLUoxetine (PROZAC) 20 MG capsule TAKE 3 CAPSULES BY MOUTH ONCE DAILY 270 capsule 0   losartan (COZAAR) 100 MG tablet Take 1 tablet by mouth once daily 90 tablet 0   pantoprazole (PROTONIX) 40 MG tablet Take 1 tablet (40 mg total) by mouth daily. 90 tablet 1   simvastatin (ZOCOR) 40 MG tablet TAKE 1 TABLET BY MOUTH AT BEDTIME 90 tablet 0   No current facility-administered medications on file prior to visit.   Past Medical History:  Diagnosis Date   Allergy    Anemia    Anxiety    Arthralgia of left temporomandibular joint    Arthritis    Cervicalgia    Chronic kidney disease, stage 3a (HCC)    Depression    Diabetes mellitus without complication ([place])    Hesitancy of micturition    Hyperlipidemia    Hypertension    Osteoporosis    Other psoriasis    Other specified menopausal and perimenopausal disorders    Overweight    Repeated falls    Residual hemorrhoidal skin tags    Past Surgical History:  Procedure Laterality Date   ABDOMINAL HYSTERECTOMY     APPENDECTOMY     CARPAL TUNNEL RELEASE Right    CHOLECYSTECTOMY     COLONOSCOPY  04/09/2016   Mild sigmoid diverticulosis. Otherwise normal colonoscopy   ESOPHAGOGASTRODUODENOSCOPY  09/10/2012   No  obvious esophageal stricture, status post empiric esophageal dilation. Irregular GE junction, questionable significance. Mild gastritis.   eye lid surgery     KNEE SURGERY Left     Family History  Problem Relation Age of Onset   Diabetes type II Mother    Hyperlipidemia Mother    Hypertension Mother    Heart attack Father    Hyperlipidemia Father    Hypertension Father    Stroke Father    Diabetes type II Father    Colon cancer Daughter 33   Cancer Daughter    Esophageal cancer Neg Hx    Rectal cancer Neg Hx    Stomach cancer Neg Hx    Social History   Socioeconomic History   Marital status: Married    Spouse name: Joneen Boers   Number of children: 4   Years of education: Not on file    Highest education level: Not on file  Occupational History   Occupation: Homemaker  Tobacco Use   Smoking status: Never   Smokeless tobacco: Never  Vaping Use   Vaping Use: Never used  Substance and Sexual Activity   Alcohol use: Never   Drug use: Never   Sexual activity: Not on file  Other Topics Concern   Not on file  Social History Narrative   Tameeka spends a lot if time helping her daughter get to and from oncology appointments   Her husband has started drinking alcohol again   Social Determinants of Health   Financial Resource Strain: Not on file  Food Insecurity: Not on file  Transportation Needs: No Transportation Needs   Lack of Transportation (Medical): No   Lack of Transportation (Non-Medical): No  Physical Activity: Not on file  Stress: Stress Concern Present   Feeling of Stress : To some extent  Social Connections: Not on file    Review of Systems  Constitutional:  Negative for chills, fatigue and fever.  HENT:  Positive for rhinorrhea. Negative for congestion, ear pain and sore throat.   Respiratory:  Negative for cough and shortness of breath.   Cardiovascular:  Negative for chest pain and palpitations.  Gastrointestinal:  Negative for abdominal pain, constipation, diarrhea, nausea and vomiting.  Endocrine: Negative for polydipsia, polyphagia and polyuria.  Genitourinary:  Negative for difficulty urinating, dysuria, frequency, pelvic pain and vaginal discharge.  Musculoskeletal:  Positive for arthralgias and back pain. Negative for myalgias.  Skin:  Negative for rash.  Neurological:  Negative for dizziness, numbness and headaches.  Psychiatric/Behavioral:  Negative for dysphoric mood. The patient is not nervous/anxious.     Objective:  BP 126/70   Pulse 78   Temp (!) 97.2 F (36.2 C)   Resp 16   Ht 5\' 2"  (1.575 m)   Wt 146 lb (66.2 kg)   SpO2 92%   BMI 26.70 kg/m   BP/Weight 03/29/2021 02/06/2021 12/14/1599  Systolic BP 093 235 573  Diastolic  BP 70 86 75  Wt. (Lbs) 146 147 146.3  BMI 26.7 26.89 26.76    Physical Exam Vitals reviewed.  Constitutional:      Appearance: Normal appearance. She is normal weight.  Neck:     Vascular: No carotid bruit.  Cardiovascular:     Rate and Rhythm: Normal rate and regular rhythm.     Pulses: Normal pulses.     Heart sounds: Normal heart sounds.  Pulmonary:     Effort: Pulmonary effort is normal. No respiratory distress.     Breath sounds: Normal breath sounds.  Abdominal:  General: Abdomen is flat. Bowel sounds are normal.     Palpations: Abdomen is soft.     Tenderness: There is no abdominal tenderness.  Neurological:     Mental Status: She is alert and oriented to person, place, and time.  Psychiatric:        Mood and Affect: Mood normal.        Behavior: Behavior normal.    Diabetic Foot Exam - Simple   Simple Foot Form  03/29/2021  9:49 PM  Visual Inspection No deformities, no ulcerations, no other skin breakdown bilaterally: Yes Sensation Testing Intact to touch and monofilament testing bilaterally: Yes Pulse Check Posterior Tibialis and Dorsalis pulse intact bilaterally: Yes Comments      Lab Results  Component Value Date   WBC 5.5 03/29/2021   HGB 14.2 03/29/2021   HCT 45.1 03/29/2021   PLT 175 03/29/2021   GLUCOSE 141 (H) 03/29/2021   CHOL 179 03/29/2021   TRIG 217 (H) 03/29/2021   HDL 49 03/29/2021   LDLCALC 93 03/29/2021   ALT 9 03/29/2021   AST 14 03/29/2021   NA 142 03/29/2021   K 4.6 03/29/2021   CL 104 03/29/2021   CREATININE 1.50 (H) 03/29/2021   BUN 21 03/29/2021   CO2 24 03/29/2021   TSH 4.890 (H) 12/14/2020   INR 1.0 12/14/2020   HGBA1C 7.3 (H) 03/29/2021   MICROALBUR 30 03/29/2021      Assessment & Plan:   Problem List Items Addressed This Visit       Digestive   GERD without esophagitis    Continue protonix 40 mg daily        Endocrine   Diabetic glomerulopathy ([place]) - Primary    Changed xigduo to farxiga 10 mg once  daily.  Continue losartan 100 mg once daily.  Fairly well controlled.        Relevant Orders   POCT UA - Microalbumin (Completed)   Hemoglobin A1c (Completed)     Nervous and Auditory   Left carpal tunnel syndrome    Refer to Dr. Amedeo Plenty      Relevant Orders   Ambulatory referral to Orthopedic Surgery     Musculoskeletal and Integument   Primary osteoarthritis of both knees    Refer back to Dr. Ronnie Derby      Relevant Orders   Ambulatory referral to Orthopedic Surgery     Genitourinary   Hypertensive kidney disease with stage 3b chronic kidney disease ([place])    Chang xigduo to [place].  Recommend hold metformin in xigduo.  Continue losartan 100 mg once daily.      Relevant Orders   Comprehensive metabolic panel (Completed)   CBC with Differential/Platelet (Completed)     Other   Iron deficiency anemia (Chronic)    Check cbc.  Resolved.       Relevant Orders   CBC with Differential/Platelet (Completed)   B12 deficiency (Chronic)    Check b12 today.      Relevant Orders   Vitamin B12 (Completed)   Mixed hyperlipidemia    Continue simvastatin 40 mg once daily.  Recommend continue to work on eating healthy diet and exercise.       Relevant Orders   Lipid panel (Completed)   Vitamin D deficiency    Recommend vitamin D otc 2000 U daily.      Relevant Orders   VITAMIN D 25 Hydroxy (Vit-D Deficiency, Fractures) (Completed)   Mild recurrent major depression (HCC)    Continue prozac 20 mg  iii daily.      Other Visit Diagnoses     Need for immunization against influenza       Relevant Orders   Flu Vaccine QUAD High Dose(Fluad) (Completed)   Encounter for immunization       Relevant Orders   Pension scheme manager (Completed)     .  No orders of the defined types were placed in this encounter.   Orders Placed This Encounter  Procedures   Flu Vaccine QUAD High Dose(Fluad)   Pfizer Covid-19 Vaccine Bivalent Booster   Comprehensive  metabolic panel   Hemoglobin A1c   Lipid panel   CBC with Differential/Platelet   Vitamin B12   VITAMIN D 25 Hydroxy (Vit-D Deficiency, Fractures)   Ambulatory referral to Orthopedic Surgery   Ambulatory referral to Orthopedic Surgery   POCT UA - Microalbumin     Follow-up: Return in about 3 months (around 06/29/2021) for chronic fasting.  An After Visit Summary was printed and given to the patient.  [name], MD [facility] [phone]

## 2021-03-30 LAB — COMPREHENSIVE METABOLIC PANEL
ALT: 9 IU/L (ref 0–32)
AST: 14 IU/L (ref 0–40)
Albumin/Globulin Ratio: 1.9 (ref 1.2–2.2)
Albumin: 4.7 g/dL (ref 3.7–4.7)
Alkaline Phosphatase: 66 IU/L (ref 44–121)
BUN/Creatinine Ratio: 14 (ref 12–28)
BUN: 21 mg/dL (ref 8–27)
Bilirubin Total: 0.9 mg/dL (ref 0.0–1.2)
CO2: 24 mmol/L (ref 20–29)
Calcium: 9.9 mg/dL (ref 8.7–10.3)
Chloride: 104 mmol/L (ref 96–106)
Creatinine, Ser: 1.5 mg/dL — ABNORMAL HIGH (ref 0.57–1.00)
Globulin, Total: 2.5 g/dL (ref 1.5–4.5)
Glucose: 141 mg/dL — ABNORMAL HIGH (ref 70–99)
Potassium: 4.6 mmol/L (ref 3.5–5.2)
Sodium: 142 mmol/L (ref 134–144)
Total Protein: 7.2 g/dL (ref 6.0–8.5)
eGFR: 37 mL/min/{1.73_m2} — ABNORMAL LOW (ref >59)

## 2021-03-30 LAB — LIPID PANEL
Chol/HDL Ratio: 3.7 ratio (ref 0.0–4.4)
Cholesterol, Total: 179 mg/dL (ref 100–199)
HDL: 49 mg/dL (ref >39)
LDL Chol Calc (NIH): 93 mg/dL (ref 0–99)
Triglycerides: 217 mg/dL — ABNORMAL HIGH (ref 0–149)
VLDL Cholesterol Cal: 37 mg/dL (ref 5–40)

## 2021-03-30 LAB — CBC WITH DIFFERENTIAL/PLATELET
Basophils Absolute: 0 10*3/uL (ref 0.0–0.2)
Basos: 1 %
EOS (ABSOLUTE): 0.2 10*3/uL (ref 0.0–0.4)
Eos: 4 %
Hematocrit: 45.1 % (ref 34.0–46.6)
Hemoglobin: 14.2 g/dL (ref 11.1–15.9)
Immature Grans (Abs): 0 10*3/uL (ref 0.0–0.1)
Immature Granulocytes: 0 %
Lymphocytes Absolute: 1.6 10*3/uL (ref 0.7–3.1)
Lymphs: 30 %
MCH: 28.2 pg (ref 26.6–33.0)
MCHC: 31.5 g/dL (ref 31.5–35.7)
MCV: 90 fL (ref 79–97)
Monocytes Absolute: 0.3 10*3/uL (ref 0.1–0.9)
Monocytes: 5 %
Neutrophils Absolute: 3.3 10*3/uL (ref 1.4–7.0)
Neutrophils: 60 %
Platelets: 175 10*3/uL (ref 150–450)
RBC: 5.04 x10E6/uL (ref 3.77–5.28)
RDW: 15.2 % (ref 11.7–15.4)
WBC: 5.5 10*3/uL (ref 3.4–10.8)

## 2021-03-30 LAB — HEMOGLOBIN A1C
Est. average glucose Bld gHb Est-mCnc: 163 mg/dL
Hgb A1c MFr Bld: 7.3 % — ABNORMAL HIGH (ref 4.8–5.6)

## 2021-03-30 LAB — VITAMIN D 25 HYDROXY (VIT D DEFICIENCY, FRACTURES): Vit D, 25-Hydroxy: 28 ng/mL — ABNORMAL LOW (ref 30.0–100.0)

## 2021-03-30 LAB — VITAMIN B12: Vitamin B-12: 249 pg/mL (ref 232–1245)

## 2021-03-31 DIAGNOSIS — E1121 Type 2 diabetes mellitus with diabetic nephropathy: Secondary | ICD-10-CM | POA: Insufficient documentation

## 2021-03-31 DIAGNOSIS — E559 Vitamin D deficiency, unspecified: Secondary | ICD-10-CM | POA: Insufficient documentation

## 2021-03-31 DIAGNOSIS — M170 Bilateral primary osteoarthritis of knee: Secondary | ICD-10-CM | POA: Insufficient documentation

## 2021-03-31 DIAGNOSIS — G5602 Carpal tunnel syndrome, left upper limb: Secondary | ICD-10-CM | POA: Insufficient documentation

## 2021-04-01 NOTE — Progress Notes (Signed)
Blood count normal.  Liver function normal.  Kidney function abnormal, but stable Cholesterol: Good except triglycerides elevated to 217.  This is increased from 116 at her last visit. HBA1C: 7.3. please review diabetes medicines. It is not in list, but I think she was on xigduo. I think I held it and wanted her to take farxiga 10 mg daily due to abormal kidney function.  B12 improved. Vitamin D level low (mild.) recommend vitamin D 2000 U otc.

## 2021-04-09 DIAGNOSIS — M170 Bilateral primary osteoarthritis of knee: Secondary | ICD-10-CM | POA: Diagnosis not present

## 2021-04-14 DIAGNOSIS — F330 Major depressive disorder, recurrent, mild: Secondary | ICD-10-CM | POA: Insufficient documentation

## 2021-04-14 DIAGNOSIS — Z01818 Encounter for other preprocedural examination: Secondary | ICD-10-CM | POA: Insufficient documentation

## 2021-04-14 NOTE — Assessment & Plan Note (Signed)
Chang xigduo to [place].  Recommend hold metformin in xigduo.  Continue losartan 100 mg once daily.

## 2021-04-14 NOTE — Assessment & Plan Note (Signed)
Continue prozac 20 mg iii daily.

## 2021-04-14 NOTE — Assessment & Plan Note (Signed)
Clear if labs good.

## 2021-04-14 NOTE — Assessment & Plan Note (Signed)
Recommend vitamin D otc 2000 U daily.

## 2021-04-14 NOTE — Assessment & Plan Note (Signed)
Refer back to Dr. Ronnie Derby

## 2021-04-14 NOTE — Assessment & Plan Note (Signed)
Continue protonix 40mg daily.  

## 2021-04-14 NOTE — Assessment & Plan Note (Signed)
Changed xigduo to farxiga 10 mg once daily.  Continue losartan 100 mg once daily.  Fairly well controlled.

## 2021-04-14 NOTE — Assessment & Plan Note (Signed)
Check b12 today.

## 2021-04-14 NOTE — Assessment & Plan Note (Signed)
Check cbc.  Resolved.

## 2021-04-14 NOTE — Assessment & Plan Note (Signed)
Continue simvastatin 40 mg once daily.  Recommend continue to work on eating healthy diet and exercise.

## 2021-04-14 NOTE — Assessment & Plan Note (Signed)
Refer to Dr. Amedeo Plenty

## 2021-04-26 DIAGNOSIS — Z419 Encounter for procedure for purposes other than remedying health state, unspecified: Secondary | ICD-10-CM | POA: Diagnosis not present

## 2021-05-02 ENCOUNTER — Telehealth: Payer: Self-pay

## 2021-05-02 NOTE — Chronic Care Management (AMB) (Signed)
    Chronic Care Management Pharmacy Assistant   Name: Alicia Jordan  MRN: [id number] DOB: 06-Apr-1946   Reason for Encounter: Disease State call for DM     Recent office visits:  03/29/21 [name] MD. Seen for DM and HTN. Instructed to [employer] to [place]. Recommend hold metformin in xigduo  Recent consult visits:  None   Hospital visits:  None   Medications: Outpatient Encounter Medications as of 05/02/2021  Medication Sig   Acetaminophen (TYLENOL PO) Take by mouth as needed.   Cetirizine HCl (ZYRTEC ALLERGY) 10 MG CAPS Take by mouth daily.   Cholecalciferol (VITAMIN D3) 25 MCG (1000 UT) CAPS Take 1 capsule by mouth daily.   diphenhydrAMINE (BENADRYL) 25 mg capsule Take 25 mg by mouth every 6 (six) hours as needed.   Ferrous Sulfate (IRON PO) Take 2 tablets by mouth daily in the afternoon.   FLUoxetine (PROZAC) 20 MG capsule TAKE 3 CAPSULES BY MOUTH ONCE DAILY   losartan (COZAAR) 100 MG tablet Take 1 tablet by mouth once daily   pantoprazole (PROTONIX) 40 MG tablet Take 1 tablet (40 mg total) by mouth daily.   simvastatin (ZOCOR) 40 MG tablet TAKE 1 TABLET BY MOUTH AT BEDTIME   No facility-administered encounter medications on file as of 05/02/2021.   Recent Relevant Labs: Lab Results  Component Value Date/Time   HGBA1C 7.3 (H) 03/29/2021 09:10 AM   HGBA1C 7.1 (H) 12/14/2020 09:45 AM   MICROALBUR 30 03/29/2021 08:59 AM   MICROALBUR 30 05/04/2020 09:13 AM    Kidney Function Lab Results  Component Value Date/Time   CREATININE 1.50 (H) 03/29/2021 09:10 AM   CREATININE 1.5 (A) 02/01/2021 12:00 AM   CREATININE 1.65 (H) 12/14/2020 09:45 AM   GFRNONAA 41 (L) 05/04/2020 09:35 AM   GFRAA 47 (L) 05/04/2020 09:35 AM     Current antihyperglycemic regimen:  Xigduo XR 5-500 mg two times daily   Patient verbally confirms she is taking the above medications as directed. Yes  What recent interventions/DTPs have been made to improve glycemic control:  No changes.  Pt stated she is not getting exercise due to bad knees currently  Have there been any recent hospitalizations or ED visits since last visit with CPP? No  Patient denies hypoglycemic symptoms, including None  Patient denies hyperglycemic symptoms, including none  How often are you checking your blood sugar? Pt checks every morning before meals   What are your blood sugars ranging?  05/02/21 123 05/01/21 135/ 04/30/21 136 04/29/21 139  On insulin? No   During the week, how often does your blood glucose drop below 70? Never  Are you checking your feet daily/regularly? Yes  Adherence Review: Is the patient currently on a STATIN medication? Yes Is the patient currently on ACE/ARB medication? Yes Does the patient have >5 day gap between last estimated fill dates? CPP to review  Care Gaps: Last eye exam / Retinopathy Screening? 02/09/20 Last Annual Wellness Visit? 09/06/20 Last Diabetic Foot Exam? 12/14/20   Star Rating Drugs:  Medication:  Last Fill: Day Supply Losartan   03/13/21 90ds Simvastatin   01/29/21 [facility] Clinical Pharmacist Assitant [phone]

## 2021-05-06 ENCOUNTER — Other Ambulatory Visit: Payer: Self-pay | Admitting: Family Medicine

## 2021-05-15 DIAGNOSIS — M720 Palmar fascial fibromatosis [Dupuytren]: Secondary | ICD-10-CM | POA: Diagnosis not present

## 2021-05-15 DIAGNOSIS — G5602 Carpal tunnel syndrome, left upper limb: Secondary | ICD-10-CM | POA: Diagnosis not present

## 2021-05-15 DIAGNOSIS — M79642 Pain in left hand: Secondary | ICD-10-CM | POA: Diagnosis not present

## 2021-05-15 DIAGNOSIS — M1812 Unilateral primary osteoarthritis of first carpometacarpal joint, left hand: Secondary | ICD-10-CM | POA: Diagnosis not present

## 2021-05-20 ENCOUNTER — Encounter: Payer: Self-pay | Admitting: Physician Assistant

## 2021-05-20 ENCOUNTER — Ambulatory Visit (INDEPENDENT_AMBULATORY_CARE_PROVIDER_SITE_OTHER): Payer: PPO | Admitting: Physician Assistant

## 2021-05-20 VITALS — BP 110/80 | HR 77 | Temp 97.3°F | Ht 62.0 in | Wt 144.2 lb

## 2021-05-20 DIAGNOSIS — J060 Acute laryngopharyngitis: Secondary | ICD-10-CM

## 2021-05-20 LAB — POC COVID19 BINAXNOW: SARS Coronavirus 2 Ag: NEGATIVE

## 2021-05-20 LAB — POCT INFLUENZA A/B
Influenza A, POC: NEGATIVE
Influenza B, POC: NEGATIVE

## 2021-05-20 MED ORDER — AZITHROMYCIN 250 MG PO TABS: ORAL_TABLET | ORAL | 0 refills | Status: AC

## 2021-05-20 NOTE — Progress Notes (Signed)
Acute Office Visit  Subjective:    Patient ID: Alicia Jordan, female    DOB: Apr 17, 1946, 75 y.o.   MRN: [id number]  Chief Complaint  Patient presents with   Cough   Nasal Congestion    HPI: Patient is in today for complaints of cough, cold and congestion for the past 2 weeks - states she has also had mild ear pain as well Denies fever  Past Medical History:  Diagnosis Date   Allergy    Anemia    Anxiety    Arthralgia of left temporomandibular joint    Arthritis    Cervicalgia    Chronic kidney disease, stage 3a (HCC)    Depression    Diabetes mellitus without complication (HCC)    Hesitancy of micturition    Hyperlipidemia    Hypertension    Osteoporosis    Other psoriasis    Other specified menopausal and perimenopausal disorders    Overweight    Repeated falls    Residual hemorrhoidal skin tags     Past Surgical History:  Procedure Laterality Date   ABDOMINAL HYSTERECTOMY     APPENDECTOMY     CARPAL TUNNEL RELEASE Right    CHOLECYSTECTOMY     COLONOSCOPY  04/09/2016   Mild sigmoid diverticulosis. Otherwise normal colonoscopy   ESOPHAGOGASTRODUODENOSCOPY  09/10/2012   No obvious esophageal stricture, status post empiric esophageal dilation. Irregular GE junction, questionable significance. Mild gastritis.   eye lid surgery     KNEE SURGERY Left     Family History  Problem Relation Age of Onset   Diabetes type II Mother    Hyperlipidemia Mother    Hypertension Mother    Heart attack Father    Hyperlipidemia Father    Hypertension Father    Stroke Father    Diabetes type II Father    Colon cancer Daughter 30   Cancer Daughter    Esophageal cancer Neg Hx    Rectal cancer Neg Hx    Stomach cancer Neg Hx     Social History   Socioeconomic History   Marital status: Married    Spouse name: Jake Shark   Number of children: 4   Years of education: Not on file   Highest education level: Not on file  Occupational History   Occupation: Homemaker   Tobacco Use   Smoking status: Never   Smokeless tobacco: Never  Vaping Use   Vaping Use: Never used  Substance and Sexual Activity   Alcohol use: Never   Drug use: Never   Sexual activity: Not on file  Other Topics Concern   Not on file  Social History Narrative   Chloe spends a lot if time helping her daughter get to and from oncology appointments   Her husband has started drinking alcohol again   Social Determinants of Health   Financial Resource Strain: Not on file  Food Insecurity: Not on file  Transportation Needs: No Transportation Needs   Lack of Transportation (Medical): No   Lack of Transportation (Non-Medical): No  Physical Activity: Not on file  Stress: Stress Concern Present   Feeling of Stress : To some extent  Social Connections: Not on file  Intimate Partner Violence: Not At Risk   Fear of Current or Ex-Partner: No   Emotionally Abused: No   Physically Abused: No   Sexually Abused: No    Outpatient Medications Prior to Visit  Medication Sig Dispense Refill   Acetaminophen (TYLENOL PO) Take by mouth as needed.  Cetirizine HCl (ZYRTEC ALLERGY) 10 MG CAPS Take by mouth daily.     Cholecalciferol (VITAMIN D3) 25 MCG (1000 UT) CAPS Take 1 capsule by mouth daily.     diphenhydrAMINE (BENADRYL) 25 mg capsule Take 25 mg by mouth every 6 (six) hours as needed.     Ferrous Sulfate (IRON PO) Take 2 tablets by mouth daily in the afternoon.     FLUoxetine (PROZAC) 20 MG capsule TAKE 3 CAPSULES BY MOUTH ONCE DAILY 270 capsule 0   losartan (COZAAR) 100 MG tablet Take 1 tablet by mouth once daily 90 tablet 0   pantoprazole (PROTONIX) 40 MG tablet Take 1 tablet (40 mg total) by mouth daily. 90 tablet 1   simvastatin (ZOCOR) 40 MG tablet TAKE 1 TABLET BY MOUTH AT BEDTIME 90 tablet 0   No facility-administered medications prior to visit.    Allergies  Allergen Reactions   Meloxicam     Review of Systems CONSTITUTIONAL: Negative for chills, fatigue, fever,  unintentional weight gain and unintentional weight loss.  E/N/T: see HPI CARDIOVASCULAR: Negative for chest pain, dizziness, palpitations and pedal edema.  RESPIRATORY: see HPI INTEGUMENTARY: Negative for rash.       Objective:    Physical Exam  BP 110/80 (BP Location: Left Arm, Patient Position: Sitting, Cuff Size: Large)   Pulse 77   Temp (!) 97.3 F (36.3 C) (Temporal)   Ht $R'5\' 2"'zv$  (1.575 m)   Wt 144 lb 3.2 oz (65.4 kg)   SpO2 99%   BMI 26.37 kg/m  Wt Readings from Last 3 Encounters:  05/20/21 144 lb 3.2 oz (65.4 kg)  03/29/21 146 lb (66.2 kg)  02/06/21 147 lb (66.7 kg)  PHYSICAL EXAM:   VS: BP 110/80 (BP Location: Left Arm, Patient Position: Sitting, Cuff Size: Large)   Pulse 77   Temp (!) 97.3 F (36.3 C) (Temporal)   Ht $R'5\' 2"'Ld$  (1.575 m)   Wt 144 lb 3.2 oz (65.4 kg)   SpO2 99%   BMI 26.37 kg/m   GEN: Well nourished, well developed, in no acute distress  HEENT: right TM normal -- left TM with air fluid level Oropharynx - mild erythema Cardiac: RRR; no murmurs,  Respiratory:  normal respiratory rate and pattern with no distress - normal breath sounds with no rales, rhonchi, wheezes or rubs  Office Visit on 05/20/2021  Component Date Value Ref Range Status   SARS Coronavirus 2 Ag 05/20/2021 Negative  Negative Final   Influenza A, POC 05/20/2021 Negative  Negative Final   Influenza B, POC 05/20/2021 Negative  Negative Final     Health Maintenance Due  Topic Date Due   Hepatitis C Screening  Never done   Zoster Vaccines- Shingrix (2 of 2) 08/06/2017   OPHTHALMOLOGY EXAM  02/08/2021    There are no preventive care reminders to display for this patient.   Lab Results  Component Value Date   TSH 4.890 (H) 12/14/2020   Lab Results  Component Value Date   WBC 5.5 03/29/2021   HGB 14.2 03/29/2021   HCT 45.1 03/29/2021   MCV 90 03/29/2021   PLT 175 03/29/2021   Lab Results  Component Value Date   NA 142 03/29/2021   K 4.6 03/29/2021   CO2 24  03/29/2021   GLUCOSE 141 (H) 03/29/2021   BUN 21 03/29/2021   CREATININE 1.50 (H) 03/29/2021   BILITOT 0.9 03/29/2021   ALKPHOS 66 03/29/2021   AST 14 03/29/2021   ALT 9 03/29/2021   PROT  7.2 03/29/2021   ALBUMIN 4.7 03/29/2021   CALCIUM 9.9 03/29/2021   EGFR 37 (L) 03/29/2021   Lab Results  Component Value Date   CHOL 179 03/29/2021   Lab Results  Component Value Date   HDL 49 03/29/2021   Lab Results  Component Value Date   LDLCALC 93 03/29/2021   Lab Results  Component Value Date   TRIG 217 (H) 03/29/2021   Lab Results  Component Value Date   CHOLHDL 3.7 03/29/2021   Lab Results  Component Value Date   HGBA1C 7.3 (H) 03/29/2021       Assessment & Plan:   Problem List Items Addressed This Visit   None Visit Diagnoses     Acute laryngopharyngitis    -  Primary   Relevant Medications   azithromycin (ZITHROMAX) 250 MG tablet   Other Relevant Orders   POC COVID-19 BinaxNow   POCT Influenza A/B      Meds ordered this encounter  Medications   azithromycin (ZITHROMAX) 250 MG tablet    Sig: Take 2 tablets on day 1, then 1 tablet daily on days 2 through 5    Dispense:  6 tablet    Refill:  0    Order Specific Question:   Supervising Provider    AnswerRochel Brome [[id number]]    Orders Placed This Encounter  Procedures   POC COVID-19 BinaxNow   POCT Influenza A/B     Follow-up: Return if symptoms worsen or fail to improve.  An After Visit Summary was printed and given to the patient.  Yetta Flock [facility] [phone]

## 2021-05-24 ENCOUNTER — Telehealth: Payer: Self-pay

## 2021-05-24 NOTE — Telephone Encounter (Signed)
Patient left VM and came into office. She was seen Monday, zpack prescribed. Pt is not feeling better and is now hoarse. She is requesting more antibiotics. Denies fever.   Royce Macadamia, [place] 05/24/21 11:29 AM

## 2021-05-24 NOTE — Progress Notes (Incomplete)
[facility]  [address] [place],  [place]  [address] [phone]  Clinic Day:  06/03/2021  Referring physician: [name] MD  This document serves as a record of services personally performed by Marice Potter, MD. It was created on their behalf by [facility] E, a trained medical scribe. The creation of this record is based on the scribe's personal observations and the provider's statements to them.  HISTORY OF PRESENT ILLNESS:  The patient is a 75 y.o. female who I recently began seeing for anemia.  Recent labs at his primary care office showed a low hemoglobin of 9.7, with a low MCV of 79.  As these findings were suggestive of iron deficiency, the patient has been taking 2 iron pills daily for the past 8 weeks.  She denies having any overt forms of blood loss to explain her anemia.  She did have a colonoscopy in 2021, which did not show any adverse GI tract pathology. She is scheduled for a GI evaluation next week.   PHYSICAL EXAM:  There were no vitals taken for this visit. Wt Readings from Last 3 Encounters:  05/20/21 144 lb 3.2 oz (65.4 kg)  03/29/21 146 lb (66.2 kg)  02/06/21 147 lb (66.7 kg)   There is no height or weight on file to calculate BMI. Performance status (ECOG): 1 - Symptomatic but completely ambulatory Physical Exam Constitutional:      Appearance: Normal appearance. She is not ill-appearing.  HENT:     Mouth/Throat:     Mouth: Mucous membranes are moist.     Pharynx: Oropharynx is clear. No oropharyngeal exudate or posterior oropharyngeal erythema.  Cardiovascular:     Rate and Rhythm: Normal rate and regular rhythm.     Heart sounds: No murmur heard.   No friction rub. No gallop.  Pulmonary:     Effort: Pulmonary effort is normal. No respiratory distress.     Breath sounds: Normal breath sounds. No wheezing, rhonchi or rales.  Abdominal:     General: Bowel sounds are normal. There is no distension.      Palpations: Abdomen is soft. There is no mass.     Tenderness: There is no abdominal tenderness.  Musculoskeletal:        General: No swelling.     Right lower leg: No edema.     Left lower leg: No edema.  Lymphadenopathy:     Cervical: No cervical adenopathy.     Upper Body:     Right upper body: No supraclavicular or axillary adenopathy.     Left upper body: No supraclavicular or axillary adenopathy.     Lower Body: No right inguinal adenopathy. No left inguinal adenopathy.  Skin:    General: Skin is warm.     Coloration: Skin is not jaundiced.     Findings: No lesion or rash.  Neurological:     General: No focal deficit present.     Mental Status: She is alert and oriented to person, place, and time. Mental status is at baseline.  Psychiatric:        Mood and Affect: Mood normal.        Behavior: Behavior normal.        Thought Content: Thought content normal.    LABS:   CBC Latest Ref Rng & Units 03/29/2021 02/01/2021 12/20/2020  WBC 3.4 - 10.8 x10E3/uL 5.5 5.7 4.3  Hemoglobin 11.1 - 15.9 g/dL 14.2 13.0 9.7(L)  Hematocrit 34.0 - 46.6 % 45.1  41 32.0(L)  Platelets 150 - 450 x10E3/uL 175 156 206   CMP Latest Ref Rng & Units 03/29/2021 02/01/2021 12/14/2020  Glucose 70 - 99 mg/dL 141(H) - 120(H)  BUN 8 - 27 mg/dL 21 24(A) 22  Creatinine 0.57 - 1.00 mg/dL 1.50(H) 1.5(A) 1.65(H)  Sodium 134 - 144 mmol/L 142 139 139  Potassium 3.5 - 5.2 mmol/L 4.6 4.3 4.3  Chloride 96 - 106 mmol/L 104 105 104  CO2 20 - 29 mmol/L 24 21 19(L)  Calcium 8.7 - 10.3 mg/dL 9.9 9.6 9.5  Total Protein 6.0 - 8.5 g/dL 7.2 - 6.7  Total Bilirubin 0.0 - 1.2 mg/dL 0.9 - 1.0  Alkaline Phos 44 - 121 IU/L 66 60 62  AST 0 - 40 IU/L 14 23 13   ALT 0 - 32 IU/L 9 13 11     Ref. Range 02/01/2021 10:31  Iron Latest Ref Range: 28 - 170 ug/dL 337 (H)  UIBC Latest Units: ug/dL 66  TIBC Latest Ref Range: 250 - 450 ug/dL 403  Saturation Ratios Latest Ref Range: 10.4 - 31.8 % 84 (H)  Ferritin Latest Ref Range: 11 - 307  ng/mL 21  Folate Latest Ref Range: >5.9 ng/mL 20.5  Vitamin B12 Latest Ref Range: 180 - 914 pg/mL 148 (L)   ASSESSMENT & PLAN:  A 75 y.o. female who I was asked to consult upon for iron deficiency anemia.  I am pleased with the significant improvement in her hemoglobin since she started her oral iron.  I am fine with her continuing 2 iron pills daily to ensure adequate fortification of her iron stores.  Of note, labs show this patient is also B12 deficient.  Based upon this, I will arrange for her to receive a protracted course of B12 injections, which will include 1000 mcg daily x 7 days, then weekly x 4 weeks, then monthly indefinitely.  I will see her back in 4 months to reassess her iron, B12 and hemoglobin levels.  The patient understands all the plans discussed today and is in agreement with them.  I, Rita Ohara, am acting as scribe for Marice Potter, MD    I have reviewed this report as typed by the medical scribe, and it is complete and accurate.  Dequincy Macarthur Critchley, MD

## 2021-05-24 NOTE — Telephone Encounter (Signed)
Made pt aware. She VU.  Harrell Lark 05/24/21 12:17 PM

## 2021-05-29 ENCOUNTER — Telehealth: Payer: Self-pay

## 2021-05-29 ENCOUNTER — Ambulatory Visit (INDEPENDENT_AMBULATORY_CARE_PROVIDER_SITE_OTHER): Payer: PPO

## 2021-05-29 ENCOUNTER — Other Ambulatory Visit: Payer: Self-pay | Admitting: Family Medicine

## 2021-05-29 DIAGNOSIS — E1121 Type 2 diabetes mellitus with diabetic nephropathy: Secondary | ICD-10-CM

## 2021-05-29 MED ORDER — DAPAGLIFLOZIN PROPANEDIOL 10 MG PO TABS: 10.0000 mg | ORAL_TABLET | Freq: Every day | ORAL | 3 refills | Status: DC

## 2021-05-29 MED ORDER — DAPAGLIFLOZIN PROPANEDIOL 10 MG PO TABS: 10.0000 mg | ORAL_TABLET | Freq: Every day | ORAL | 0 refills | Status: DC

## 2021-05-29 NOTE — Progress Notes (Signed)
Coordinated with PCP regarding [place]. Dr. Tobie Poet has a question about if you could crush tablet. Unable to find definitive data either way stating. Informed PCP of this. Though I did find specifics in Xigduo that said, "Do not crush." Because the same manufacture goes out of their way to say it in one medicine but not the other, conclusions may be drawn but again, no specific answer found.

## 2021-05-31 NOTE — Chronic Care Management (AMB) (Signed)
° ° °  Chronic Care Management Pharmacy Assistant   Name: Alicia Jordan  MRN: [id number] DOB: Aug 27, 1945  Reason for Encounter: Patient Assistance Coordination  05/29/2021- Patient came into [facility] with questions regarding Xigudo patient assistance medication with [employer] patient assistance program. Spoke with patient, informed that her patient assistance application will be automatically re-enrolled for 2023 year. Patient has not received a shipment and has 4 pills left, Control and instrumentation engineer went to check with CMA's if samples were available. No samples available at the office, gave patient number to [employer] to follow up on refill shipment and I informed patient that I will check with Dr Tobie Poet and [place] Constable regarding medication alternatives and will make sure an updated prescription is faxed to [facility] who supplies medications for [employer]. Patient mentioned that she would like to go back on Metformin due to her concerns about Xigduo not absorbing into her body. Patient states every morning when she goes to the bathroom, she sees traces of medication in the toliet. Patient states she rather deal with the diarrhea form the Metformin rather than not knowing if medication she takes daily is not helping her Diabetes. Patient aware I will follow up wit providers and call her back today. Message sent to Elray Mcgregor, CMA to send a task to Dr. Tobie Poet with questions regarding Metformin and Xigduo.  Spoke with Dr. Tobie Poet when she returned to the office regarding patient concerns and request, Dr Tobie Poet would not recommend patient going back on Metformin due to causing elevated kidney functions for patient in the past. Dr. Tobie Poet contacted patient, new medication was discussed with patient and [place] Constable, Pharm D was consulted on medication dosage. Dr. Tobie Poet  changed patient to Farxiga 10 mg taking 1 tablet daily, new year supply printed to fax to [place] for  patient assistance and a 30 day free trial coupon found for patient to pick up medication from local pharmacy. Patient  uses [facility], Dr [facility] sent 30 day supply to pharmacy.  Printed coupon and called [facility] on [address] in [place], supplied coupon details for patient's prescription.   05/30/2021- Faxed new prescription to [facility] with [employer] patient assistance program for Farxiga 10 mg.   Medications: Outpatient Encounter Medications as of 05/29/2021  Medication Sig   Acetaminophen (TYLENOL PO) Take by mouth as needed.   Cetirizine HCl (ZYRTEC ALLERGY) 10 MG CAPS Take by mouth daily.   Cholecalciferol (VITAMIN D3) 25 MCG (1000 UT) CAPS Take 1 capsule by mouth daily.   dapagliflozin propanediol (FARXIGA) 10 MG TABS tablet Take 1 tablet (10 mg total) by mouth daily before breakfast.   diphenhydrAMINE (BENADRYL) 25 mg capsule Take 25 mg by mouth every 6 (six) hours as needed.   Ferrous Sulfate (IRON PO) Take 2 tablets by mouth daily in the afternoon.   FLUoxetine (PROZAC) 20 MG capsule TAKE 3 CAPSULES BY MOUTH ONCE DAILY   losartan (COZAAR) 100 MG tablet Take 1 tablet by mouth once daily   pantoprazole (PROTONIX) 40 MG tablet Take 1 tablet (40 mg total) by mouth daily.   simvastatin (ZOCOR) 40 MG tablet TAKE 1 TABLET BY MOUTH AT BEDTIME   No facility-administered encounter medications on file as of 05/29/2021.    Pattricia Boss, [facility] Pharmacist Assistant [phone]

## 2021-06-03 ENCOUNTER — Other Ambulatory Visit: Payer: PPO

## 2021-06-03 ENCOUNTER — Ambulatory Visit: Payer: PPO | Admitting: Oncology

## 2021-06-03 DIAGNOSIS — G8918 Other acute postprocedural pain: Secondary | ICD-10-CM | POA: Diagnosis not present

## 2021-06-03 DIAGNOSIS — M1711 Unilateral primary osteoarthritis, right knee: Secondary | ICD-10-CM | POA: Diagnosis not present

## 2021-06-03 DIAGNOSIS — Z96651 Presence of right artificial knee joint: Secondary | ICD-10-CM | POA: Diagnosis not present

## 2021-06-03 HISTORY — PX: REPLACEMENT TOTAL KNEE: SUR1224

## 2021-06-08 ENCOUNTER — Other Ambulatory Visit: Payer: Self-pay | Admitting: Physician Assistant

## 2021-06-11 DIAGNOSIS — Z4733 Aftercare following explantation of knee joint prosthesis: Secondary | ICD-10-CM | POA: Diagnosis not present

## 2021-06-11 DIAGNOSIS — M62551 Muscle wasting and atrophy, not elsewhere classified, right thigh: Secondary | ICD-10-CM | POA: Diagnosis not present

## 2021-06-11 DIAGNOSIS — M25561 Pain in right knee: Secondary | ICD-10-CM | POA: Diagnosis not present

## 2021-06-11 DIAGNOSIS — M25461 Effusion, right knee: Secondary | ICD-10-CM | POA: Diagnosis not present

## 2021-06-11 DIAGNOSIS — Z96651 Presence of right artificial knee joint: Secondary | ICD-10-CM | POA: Diagnosis not present

## 2021-06-11 DIAGNOSIS — R2689 Other abnormalities of gait and mobility: Secondary | ICD-10-CM | POA: Diagnosis not present

## 2021-06-12 DIAGNOSIS — M62551 Muscle wasting and atrophy, not elsewhere classified, right thigh: Secondary | ICD-10-CM | POA: Diagnosis not present

## 2021-06-12 DIAGNOSIS — Z4733 Aftercare following explantation of knee joint prosthesis: Secondary | ICD-10-CM | POA: Diagnosis not present

## 2021-06-12 DIAGNOSIS — M25561 Pain in right knee: Secondary | ICD-10-CM | POA: Diagnosis not present

## 2021-06-12 DIAGNOSIS — M25461 Effusion, right knee: Secondary | ICD-10-CM | POA: Diagnosis not present

## 2021-06-12 DIAGNOSIS — R2689 Other abnormalities of gait and mobility: Secondary | ICD-10-CM | POA: Diagnosis not present

## 2021-06-12 DIAGNOSIS — Z96651 Presence of right artificial knee joint: Secondary | ICD-10-CM | POA: Diagnosis not present

## 2021-06-13 ENCOUNTER — Other Ambulatory Visit: Payer: Self-pay | Admitting: Family Medicine

## 2021-06-13 DIAGNOSIS — Z96651 Presence of right artificial knee joint: Secondary | ICD-10-CM | POA: Diagnosis not present

## 2021-06-13 MED ORDER — SITAGLIPTIN PHOSPHATE 100 MG PO TABS: 100.0000 mg | ORAL_TABLET | Freq: Every day | ORAL | 0 refills | Status: DC

## 2021-06-14 DIAGNOSIS — R2689 Other abnormalities of gait and mobility: Secondary | ICD-10-CM | POA: Diagnosis not present

## 2021-06-14 DIAGNOSIS — M25561 Pain in right knee: Secondary | ICD-10-CM | POA: Diagnosis not present

## 2021-06-14 DIAGNOSIS — Z96651 Presence of right artificial knee joint: Secondary | ICD-10-CM | POA: Diagnosis not present

## 2021-06-14 DIAGNOSIS — M62551 Muscle wasting and atrophy, not elsewhere classified, right thigh: Secondary | ICD-10-CM | POA: Diagnosis not present

## 2021-06-14 DIAGNOSIS — Z4733 Aftercare following explantation of knee joint prosthesis: Secondary | ICD-10-CM | POA: Diagnosis not present

## 2021-06-14 DIAGNOSIS — M25461 Effusion, right knee: Secondary | ICD-10-CM | POA: Diagnosis not present

## 2021-06-15 DIAGNOSIS — E1121 Type 2 diabetes mellitus with diabetic nephropathy: Secondary | ICD-10-CM

## 2021-06-16 DIAGNOSIS — M1711 Unilateral primary osteoarthritis, right knee: Secondary | ICD-10-CM | POA: Diagnosis not present

## 2021-06-16 DIAGNOSIS — Z96651 Presence of right artificial knee joint: Secondary | ICD-10-CM | POA: Diagnosis not present

## 2021-06-18 DIAGNOSIS — Z96651 Presence of right artificial knee joint: Secondary | ICD-10-CM | POA: Diagnosis not present

## 2021-06-18 DIAGNOSIS — R2689 Other abnormalities of gait and mobility: Secondary | ICD-10-CM | POA: Diagnosis not present

## 2021-06-18 DIAGNOSIS — M25561 Pain in right knee: Secondary | ICD-10-CM | POA: Diagnosis not present

## 2021-06-18 DIAGNOSIS — M25461 Effusion, right knee: Secondary | ICD-10-CM | POA: Diagnosis not present

## 2021-06-18 DIAGNOSIS — Z4733 Aftercare following explantation of knee joint prosthesis: Secondary | ICD-10-CM | POA: Diagnosis not present

## 2021-06-18 DIAGNOSIS — M62551 Muscle wasting and atrophy, not elsewhere classified, right thigh: Secondary | ICD-10-CM | POA: Diagnosis not present

## 2021-06-19 DIAGNOSIS — M25561 Pain in right knee: Secondary | ICD-10-CM | POA: Diagnosis not present

## 2021-06-19 DIAGNOSIS — Z96651 Presence of right artificial knee joint: Secondary | ICD-10-CM | POA: Diagnosis not present

## 2021-06-19 DIAGNOSIS — Z4733 Aftercare following explantation of knee joint prosthesis: Secondary | ICD-10-CM | POA: Diagnosis not present

## 2021-06-19 DIAGNOSIS — M25461 Effusion, right knee: Secondary | ICD-10-CM | POA: Diagnosis not present

## 2021-06-19 DIAGNOSIS — M62551 Muscle wasting and atrophy, not elsewhere classified, right thigh: Secondary | ICD-10-CM | POA: Diagnosis not present

## 2021-06-19 DIAGNOSIS — R2689 Other abnormalities of gait and mobility: Secondary | ICD-10-CM | POA: Diagnosis not present

## 2021-06-21 DIAGNOSIS — Z96651 Presence of right artificial knee joint: Secondary | ICD-10-CM | POA: Diagnosis not present

## 2021-06-21 DIAGNOSIS — M62551 Muscle wasting and atrophy, not elsewhere classified, right thigh: Secondary | ICD-10-CM | POA: Diagnosis not present

## 2021-06-21 DIAGNOSIS — R2689 Other abnormalities of gait and mobility: Secondary | ICD-10-CM | POA: Diagnosis not present

## 2021-06-21 DIAGNOSIS — Z4733 Aftercare following explantation of knee joint prosthesis: Secondary | ICD-10-CM | POA: Diagnosis not present

## 2021-06-21 DIAGNOSIS — M25561 Pain in right knee: Secondary | ICD-10-CM | POA: Diagnosis not present

## 2021-06-21 DIAGNOSIS — M25461 Effusion, right knee: Secondary | ICD-10-CM | POA: Diagnosis not present

## 2021-06-24 DIAGNOSIS — M25461 Effusion, right knee: Secondary | ICD-10-CM | POA: Diagnosis not present

## 2021-06-24 DIAGNOSIS — Z4733 Aftercare following explantation of knee joint prosthesis: Secondary | ICD-10-CM | POA: Diagnosis not present

## 2021-06-24 DIAGNOSIS — R2689 Other abnormalities of gait and mobility: Secondary | ICD-10-CM | POA: Diagnosis not present

## 2021-06-24 DIAGNOSIS — M62551 Muscle wasting and atrophy, not elsewhere classified, right thigh: Secondary | ICD-10-CM | POA: Diagnosis not present

## 2021-06-24 DIAGNOSIS — Z96651 Presence of right artificial knee joint: Secondary | ICD-10-CM | POA: Diagnosis not present

## 2021-06-24 DIAGNOSIS — M25561 Pain in right knee: Secondary | ICD-10-CM | POA: Diagnosis not present

## 2021-06-26 ENCOUNTER — Ambulatory Visit (INDEPENDENT_AMBULATORY_CARE_PROVIDER_SITE_OTHER): Payer: PPO | Admitting: Physician Assistant

## 2021-06-26 ENCOUNTER — Encounter: Payer: Self-pay | Admitting: Physician Assistant

## 2021-06-26 VITALS — BP 120/60 | HR 96 | Temp 99.2°F | Resp 15 | Ht 61.42 in | Wt 142.0 lb

## 2021-06-26 DIAGNOSIS — Z96651 Presence of right artificial knee joint: Secondary | ICD-10-CM | POA: Diagnosis not present

## 2021-06-26 DIAGNOSIS — J060 Acute laryngopharyngitis: Secondary | ICD-10-CM

## 2021-06-26 DIAGNOSIS — Z4733 Aftercare following explantation of knee joint prosthesis: Secondary | ICD-10-CM | POA: Diagnosis not present

## 2021-06-26 DIAGNOSIS — M25461 Effusion, right knee: Secondary | ICD-10-CM | POA: Diagnosis not present

## 2021-06-26 DIAGNOSIS — R2689 Other abnormalities of gait and mobility: Secondary | ICD-10-CM | POA: Diagnosis not present

## 2021-06-26 DIAGNOSIS — M62551 Muscle wasting and atrophy, not elsewhere classified, right thigh: Secondary | ICD-10-CM | POA: Diagnosis not present

## 2021-06-26 DIAGNOSIS — M25561 Pain in right knee: Secondary | ICD-10-CM | POA: Diagnosis not present

## 2021-06-26 LAB — POCT INFLUENZA A/B
Influenza A, POC: NEGATIVE
Influenza B, POC: NEGATIVE

## 2021-06-26 LAB — POC COVID19 BINAXNOW: SARS Coronavirus 2 Ag: NEGATIVE

## 2021-06-26 MED ORDER — CEFDINIR 300 MG PO CAPS: 300.0000 mg | ORAL_CAPSULE | Freq: Two times a day (BID) | ORAL | 0 refills | Status: DC

## 2021-06-26 MED ORDER — BENZONATATE 100 MG PO CAPS: 100.0000 mg | ORAL_CAPSULE | Freq: Two times a day (BID) | ORAL | 0 refills | Status: DC | PRN

## 2021-06-26 NOTE — Progress Notes (Signed)
Acute Office Visit  Subjective:    Patient ID: Alicia Jordan, female    DOB: 22-Feb-1946, 76 y.o.   MRN: [id number]  Chief Complaint  Patient presents with   Sore Throat   Cough    HPI: Patient is in today for complaints of sinus congestion, sore throat, pnd for the past few days - has had laryngitis as well as mild malaise Denies fever or headache  Past Medical History:  Diagnosis Date   Allergy    Anemia    Anxiety    Arthralgia of left temporomandibular joint    Arthritis    Cervicalgia    Chronic kidney disease, stage 3a ([place])    Depression    Diabetes mellitus without complication ([place])    Hesitancy of micturition    Hyperlipidemia    Hypertension    Osteoporosis    Other psoriasis    Other specified menopausal and perimenopausal disorders    Overweight    Repeated falls    Residual hemorrhoidal skin tags     Past Surgical History:  Procedure Laterality Date   ABDOMINAL HYSTERECTOMY     APPENDECTOMY     CARPAL TUNNEL RELEASE Right    CHOLECYSTECTOMY     COLONOSCOPY  04/09/2016   Mild sigmoid diverticulosis. Otherwise normal colonoscopy   ESOPHAGOGASTRODUODENOSCOPY  09/10/2012   No obvious esophageal stricture, status post empiric esophageal dilation. Irregular GE junction, questionable significance. Mild gastritis.   eye lid surgery     KNEE SURGERY Left    REPLACEMENT TOTAL KNEE Right 06/03/2021    Family History  Problem Relation Age of Onset   Diabetes type II Mother    Hyperlipidemia Mother    Hypertension Mother    Heart attack Father    Hyperlipidemia Father    Hypertension Father    Stroke Father    Diabetes type II Father    Colon cancer Daughter 27   Cancer Daughter    Esophageal cancer Neg Hx    Rectal cancer Neg Hx    Stomach cancer Neg Hx     Social History   Socioeconomic History   Marital status: Married    Spouse name: Joneen Boers   Number of children: 4   Years of education: Not on file   Highest education level: Not on  file  Occupational History   Occupation: Homemaker  Tobacco Use   Smoking status: Never   Smokeless tobacco: Never  Vaping Use   Vaping Use: Never used  Substance and Sexual Activity   Alcohol use: Never   Drug use: Never   Sexual activity: Not on file  Other Topics Concern   Not on file  Social History Narrative   [place] spends a lot if time helping her daughter get to and from oncology appointments   Her husband has started drinking alcohol again   Social Determinants of Health   Financial Resource Strain: Not on file  Food Insecurity: Not on file  Transportation Needs: No Transportation Needs   Lack of Transportation (Medical): No   Lack of Transportation (Non-Medical): No  Physical Activity: Not on file  Stress: Stress Concern Present   Feeling of Stress : To some extent  Social Connections: Not on file  Intimate Partner Violence: Not At Risk   Fear of Current or Ex-Partner: No   Emotionally Abused: No   Physically Abused: No   Sexually Abused: No    Outpatient Medications Prior to Visit  Medication Sig Dispense Refill   Acetaminophen (  TYLENOL PO) Take by mouth as needed.     Cetirizine HCl (ZYRTEC ALLERGY) 10 MG CAPS Take by mouth daily.     Cholecalciferol (VITAMIN D3) 25 MCG (1000 UT) CAPS Take 1 capsule by mouth daily.     dapagliflozin propanediol (FARXIGA) 10 MG TABS tablet Take 1 tablet (10 mg total) by mouth daily before breakfast. 90 tablet 3   diphenhydrAMINE (BENADRYL) 25 mg capsule Take 25 mg by mouth every 6 (six) hours as needed.     Ferrous Sulfate (IRON PO) Take 2 tablets by mouth daily in the afternoon.     FLUoxetine (PROZAC) 20 MG capsule TAKE 3 CAPSULES BY MOUTH ONCE DAILY 270 capsule 0   losartan (COZAAR) 100 MG tablet Take 1 tablet by mouth once daily 90 tablet 0   methocarbamol (ROBAXIN) 500 MG tablet Take 500-1,000 mg by mouth 4 (four) times daily.     oxyCODONE (OXY IR/ROXICODONE) 5 MG immediate release tablet Take 5 mg by mouth every 4  (four) hours as needed.     pantoprazole (PROTONIX) 40 MG tablet Take 1 tablet (40 mg total) by mouth daily. 90 tablet 1   simvastatin (ZOCOR) 40 MG tablet TAKE 1 TABLET BY MOUTH AT BEDTIME 90 tablet 0   sitaGLIPtin (JANUVIA) 100 MG tablet Take 1 tablet (100 mg total) by mouth daily. 90 tablet 0   No facility-administered medications prior to visit.    Allergies  Allergen Reactions   Meloxicam     Review of Systems CONSTITUTIONAL: Negative for chills, fatigue, fever, unintentional weight gain and unintentional weight loss.  E/N/T: see HPI CARDIOVASCULAR: Negative for chest pain, dizziness, palpitations and pedal edema.  RESPIRATORY: see HPI GASTROINTESTINAL: Negative for abdominal pain, acid reflux symptoms, constipation, diarrhea, nausea and vomiting.       Objective:    Physical Exam PHYSICAL EXAM:   VS: BP 120/60    Pulse 96    Temp 99.2 F (37.3 C)    Resp 15    Ht 5' 1.42" (1.56 m)    Wt 142 lb (64.4 kg)    BMI 26.47 kg/m   GEN: Well nourished, well developed, in no acute distress  HEENT: normal external ears and nose - normal external auditory canals and TMS - - Lips, Teeth and Gums - normal  Oropharynx - erythema/pnd Cardiac: RRR; no murmurs, rubs, or gallops,no edema - Respiratory:  normal respiratory rate and pattern with no distress - normal breath sounds with no rales, rhonchi, wheezes or rubs Psych: euthymic mood, appropriate affect and demeanor  BP 120/60    Pulse 96    Temp 99.2 F (37.3 C)    Resp 15    Ht 5' 1.42" (1.56 m)    Wt 142 lb (64.4 kg)    BMI 26.47 kg/m  Wt Readings from Last 3 Encounters:  06/26/21 142 lb (64.4 kg)  05/20/21 144 lb 3.2 oz (65.4 kg)  03/29/21 146 lb (66.2 kg)    Health Maintenance Due  Topic Date Due   Hepatitis C Screening  Never done   Zoster Vaccines- Shingrix (2 of 2) 08/06/2017   OPHTHALMOLOGY EXAM  02/08/2021    There are no preventive care reminders to display for this patient.   Lab Results  Component Value  Date   TSH 4.890 (H) 12/14/2020   Lab Results  Component Value Date   WBC 5.5 03/29/2021   HGB 14.2 03/29/2021   HCT 45.1 03/29/2021   MCV 90 03/29/2021   PLT 175 03/29/2021  Lab Results  Component Value Date   NA 142 03/29/2021   K 4.6 03/29/2021   CO2 24 03/29/2021   GLUCOSE 141 (H) 03/29/2021   BUN 21 03/29/2021   CREATININE 1.50 (H) 03/29/2021   BILITOT 0.9 03/29/2021   ALKPHOS 66 03/29/2021   AST 14 03/29/2021   ALT 9 03/29/2021   PROT 7.2 03/29/2021   ALBUMIN 4.7 03/29/2021   CALCIUM 9.9 03/29/2021   EGFR 37 (L) 03/29/2021   Lab Results  Component Value Date   CHOL 179 03/29/2021   Lab Results  Component Value Date   HDL 49 03/29/2021   Lab Results  Component Value Date   LDLCALC 93 03/29/2021   Lab Results  Component Value Date   TRIG 217 (H) 03/29/2021   Lab Results  Component Value Date   CHOLHDL 3.7 03/29/2021   Lab Results  Component Value Date   HGBA1C 7.3 (H) 03/29/2021       Assessment & Plan:   Problem List Items Addressed This Visit   None Visit Diagnoses     Acute laryngopharyngitis    -  Primary   Relevant Medications   cefdinir (OMNICEF) 300 MG capsule   benzonatate (TESSALON) 100 MG capsule   Other Relevant Orders   POC COVID-19 (Completed)   Influenza A/B (Completed)      Meds ordered this encounter  Medications   cefdinir (OMNICEF) 300 MG capsule    Sig: Take 1 capsule (300 mg total) by mouth 2 (two) times daily.    Dispense:  20 capsule    Refill:  0    Order Specific Question:   Supervising Provider    Answer:   Shelton Silvas   benzonatate (TESSALON) 100 MG capsule    Sig: Take 1 capsule (100 mg total) by mouth 2 (two) times daily as needed for cough.    Dispense:  20 capsule    Refill:  0    Order Specific Question:   Supervising Provider    AnswerShelton Silvas    Orders Placed This Encounter  Procedures   POC COVID-19   Influenza A/B     Follow-up: Return if symptoms worsen or  fail to improve.  An After Visit Summary was printed and given to the patient.  Yetta Flock [facility] [phone]

## 2021-06-26 NOTE — Progress Notes (Signed)
W

## 2021-06-28 DIAGNOSIS — Z4733 Aftercare following explantation of knee joint prosthesis: Secondary | ICD-10-CM | POA: Diagnosis not present

## 2021-06-28 DIAGNOSIS — R2689 Other abnormalities of gait and mobility: Secondary | ICD-10-CM | POA: Diagnosis not present

## 2021-06-28 DIAGNOSIS — M25461 Effusion, right knee: Secondary | ICD-10-CM | POA: Diagnosis not present

## 2021-06-28 DIAGNOSIS — M25561 Pain in right knee: Secondary | ICD-10-CM | POA: Diagnosis not present

## 2021-06-28 DIAGNOSIS — M62551 Muscle wasting and atrophy, not elsewhere classified, right thigh: Secondary | ICD-10-CM | POA: Diagnosis not present

## 2021-06-28 DIAGNOSIS — Z96651 Presence of right artificial knee joint: Secondary | ICD-10-CM | POA: Diagnosis not present

## 2021-07-02 DIAGNOSIS — Z4733 Aftercare following explantation of knee joint prosthesis: Secondary | ICD-10-CM | POA: Diagnosis not present

## 2021-07-02 DIAGNOSIS — M62551 Muscle wasting and atrophy, not elsewhere classified, right thigh: Secondary | ICD-10-CM | POA: Diagnosis not present

## 2021-07-02 DIAGNOSIS — M25561 Pain in right knee: Secondary | ICD-10-CM | POA: Diagnosis not present

## 2021-07-02 DIAGNOSIS — Z96651 Presence of right artificial knee joint: Secondary | ICD-10-CM | POA: Diagnosis not present

## 2021-07-02 DIAGNOSIS — M25461 Effusion, right knee: Secondary | ICD-10-CM | POA: Diagnosis not present

## 2021-07-02 DIAGNOSIS — R2689 Other abnormalities of gait and mobility: Secondary | ICD-10-CM | POA: Diagnosis not present

## 2021-07-03 NOTE — Progress Notes (Signed)
Subjective:  Patient ID: Alicia Jordan, female    DOB: 1945-12-08  Age: 76 y.o. MRN: [id number]  Chief Complaint  Patient presents with   Hypertension   Hyperlipidemia   Diabetes    HPI  Diabetes complicated by glomerulopathy: Patient is not taking farxiga 10 mg daily due to misunderstanding. Has been taking Januvia 100 mg daily. ON losartan for glomerulopathy. She mentioned to check her blood sugar daily and the sugar ranges are 150 -160 mg/dl. She denied any low sugars. Patient checks her feet daily and last eye exam 02/09/2020.  Hypertension: Currently taking losartan 100 mg daily.  Hyperlipidemia: Takes simvastatin 40 mg daily.   Current Outpatient Medications on File Prior to Visit  Medication Sig Dispense Refill   Acetaminophen (TYLENOL PO) Take by mouth as needed.     benzonatate (TESSALON) 100 MG capsule Take 1 capsule (100 mg total) by mouth 2 (two) times daily as needed for cough. 20 capsule 0   cefdinir (OMNICEF) 300 MG capsule Take 1 capsule (300 mg total) by mouth 2 (two) times daily. 20 capsule 0   Cetirizine HCl (ZYRTEC ALLERGY) 10 MG CAPS Take by mouth daily.     Cholecalciferol (VITAMIN D3) 25 MCG (1000 UT) CAPS Take 1 capsule by mouth daily.     dapagliflozin propanediol (FARXIGA) 10 MG TABS tablet Take 1 tablet (10 mg total) by mouth daily before breakfast. 90 tablet 3   diphenhydrAMINE (BENADRYL) 25 mg capsule Take 25 mg by mouth every 6 (six) hours as needed.     Ferrous Sulfate (IRON PO) Take 2 tablets by mouth daily in the afternoon.     FLUoxetine (PROZAC) 20 MG capsule TAKE 3 CAPSULES BY MOUTH ONCE DAILY 270 capsule 0   losartan (COZAAR) 100 MG tablet Take 1 tablet by mouth once daily 90 tablet 0   methocarbamol (ROBAXIN) 500 MG tablet Take 500-1,000 mg by mouth 4 (four) times daily.     oxyCODONE (OXY IR/ROXICODONE) 5 MG immediate release tablet Take 5 mg by mouth every 4 (four) hours as needed.     pantoprazole (PROTONIX) 40 MG tablet Take 1 tablet (40  mg total) by mouth daily. 90 tablet 1   simvastatin (ZOCOR) 40 MG tablet TAKE 1 TABLET BY MOUTH AT BEDTIME 90 tablet 0   sitaGLIPtin (JANUVIA) 100 MG tablet Take 1 tablet (100 mg total) by mouth daily. 90 tablet 0   No current facility-administered medications on file prior to visit.   Past Medical History:  Diagnosis Date   Allergy    Anemia    Anxiety    Arthralgia of left temporomandibular joint    Arthritis    Cervicalgia    Chronic kidney disease, stage 3a (HCC)    Depression    Diabetes mellitus without complication ([place])    Hesitancy of micturition    Hyperlipidemia    Hypertension    Osteoporosis    Other psoriasis    Other specified menopausal and perimenopausal disorders    Overweight    Repeated falls    Residual hemorrhoidal skin tags    Past Surgical History:  Procedure Laterality Date   ABDOMINAL HYSTERECTOMY     APPENDECTOMY     CARPAL TUNNEL RELEASE Right    CHOLECYSTECTOMY     COLONOSCOPY  04/09/2016   Mild sigmoid diverticulosis. Otherwise normal colonoscopy   ESOPHAGOGASTRODUODENOSCOPY  09/10/2012   No obvious esophageal stricture, status post empiric esophageal dilation. Irregular GE junction, questionable significance. Mild gastritis.   eye lid  surgery     KNEE SURGERY Left    REPLACEMENT TOTAL KNEE Right 06/03/2021    Family History  Problem Relation Age of Onset   Diabetes type II Mother    Hyperlipidemia Mother    Hypertension Mother    Heart attack Father    Hyperlipidemia Father    Hypertension Father    Stroke Father    Diabetes type II Father    Colon cancer Daughter 61   Cancer Daughter    Esophageal cancer Neg Hx    Rectal cancer Neg Hx    Stomach cancer Neg Hx    Social History   Socioeconomic History   Marital status: Married    Spouse name: Joneen Boers   Number of children: 4   Years of education: Not on file   Highest education level: Not on file  Occupational History   Occupation: Homemaker  Tobacco Use   Smoking  status: Never   Smokeless tobacco: Never  Vaping Use   Vaping Use: Never used  Substance and Sexual Activity   Alcohol use: Never   Drug use: Never   Sexual activity: Not on file  Other Topics Concern   Not on file  Social History Narrative   [place] spends a lot if time helping her daughter get to and from oncology appointments   Her husband has started drinking alcohol again   Social Determinants of Health   Financial Resource Strain: Not on file  Food Insecurity: Not on file  Transportation Needs: No Transportation Needs   Lack of Transportation (Medical): No   Lack of Transportation (Non-Medical): No  Physical Activity: Not on file  Stress: Stress Concern Present   Feeling of Stress : To some extent  Social Connections: Not on file    Review of Systems  Constitutional:  Positive for fatigue. Negative for chills and fever.  HENT:  Positive for rhinorrhea. Negative for congestion and sore throat.   Respiratory:  Positive for cough. Negative for shortness of breath.   Cardiovascular:  Negative for chest pain.  Gastrointestinal:  Negative for abdominal pain, constipation, diarrhea, nausea and vomiting.  Genitourinary:  Negative for dysuria and urgency.  Musculoskeletal:  Positive for arthralgias (knee pain s/p tkr. awakens her at 3 am. Has a cough for 3 weeks. Started with acute illness. Cough has improved, but not gone. Bothers more at night. tessalon perles and corecetin not helping.) and myalgias. Negative for back pain.  Neurological:  Positive for headaches. Negative for dizziness, weakness and light-headedness.  Psychiatric/Behavioral:  Negative for dysphoric mood. The patient is not nervous/anxious.     Objective:  BP 120/60    Pulse 76    Temp 98.6 F (37 C)    Resp 16    Ht 5' 1.42" (1.56 m)    Wt 138 lb (62.6 kg)    BMI 25.72 kg/m   BP/Weight 07/04/2021 06/26/2021 35/10/7320  Systolic BP 025 427 062  Diastolic BP 60 60 80  Wt. (Lbs) 138 142 144.2  BMI 25.72 26.47  26.37    Physical Exam Vitals reviewed.  Constitutional:      Appearance: Normal appearance. She is normal weight.  HENT:     Right Ear: Tympanic membrane, ear canal and external ear normal.     Left Ear: Tympanic membrane, ear canal and external ear normal.     Nose: Nose normal.     Mouth/Throat:     Pharynx: Oropharynx is clear.  Neck:     Vascular: No carotid  bruit.  Cardiovascular:     Rate and Rhythm: Normal rate and regular rhythm.     Pulses: Normal pulses.     Heart sounds: Normal heart sounds. No murmur heard. Pulmonary:     Effort: Pulmonary effort is normal. No respiratory distress.     Breath sounds: Normal breath sounds.  Abdominal:     General: Abdomen is flat. Bowel sounds are normal.     Palpations: Abdomen is soft.     Tenderness: There is no abdominal tenderness.  Musculoskeletal:        General: Tenderness present.     Comments: Rt knee flexion to 100 degrees.   Neurological:     Mental Status: She is alert and oriented to person, place, and time.  Psychiatric:        Mood and Affect: Mood normal.        Behavior: Behavior normal.    Diabetic Foot Exam - Simple   Simple Foot Form Diabetic Foot exam was performed with the following findings: Yes 07/04/2021  9:47 AM  Visual Inspection No deformities, no ulcerations, no other skin breakdown bilaterally: Yes Sensation Testing Intact to touch and monofilament testing bilaterally: Yes Pulse Check Posterior Tibialis and Dorsalis pulse intact bilaterally: Yes Comments      Lab Results  Component Value Date   WBC 6.8 07/04/2021   HGB 12.9 07/04/2021   HCT 39.5 07/04/2021   PLT 229 07/04/2021   GLUCOSE 156 (H) 07/04/2021   CHOL 162 07/04/2021   TRIG 191 (H) 07/04/2021   HDL 45 07/04/2021   LDLCALC 85 07/04/2021   ALT 10 07/04/2021   AST 16 07/04/2021   NA 142 07/04/2021   K 4.5 07/04/2021   CL 103 07/04/2021   CREATININE 1.22 (H) 07/04/2021   BUN 13 07/04/2021   CO2 24 07/04/2021   TSH  4.890 (H) 12/14/2020   INR 1.0 12/14/2020   HGBA1C 6.8 (H) 07/04/2021   MICROALBUR 30 03/29/2021      Assessment & Plan:   Problem List Items Addressed This Visit       Cardiovascular and Mediastinum   Hypertension associated with diabetes ([place]) - Primary (Chronic)    Well controlled.  No changes to medicines.  Continue to work on eating a healthy diet and exercise.  Labs drawn today.         Digestive   GERD without esophagitis    The current medical regimen is effective;  continue present plan and medications.        Endocrine   Dyslipidemia associated with type 2 diabetes mellitus ([place])    Well controlled.  No changes to medicines.  Continue to work on eating a healthy diet and exercise.  Labs drawn today.       Diabetic glomerulopathy ([place])    Control: Fair controlled.  Recommend check sugars fasting daily. Recommend check feet daily. Recommend annual eye exams. Medicines: Taking januvia  100 mg daily. She was not taking farxiga 10 mg due to misunderstanding but she will start it. Continue to work on eating a healthy diet and exercise.  Labs drawn today.        Relevant Orders   Hemoglobin A1c (Completed)   Microalbumin / creatinine urine ratio (Completed)     Genitourinary   Hypertensive kidney disease with stage 3b chronic kidney disease (HCC)    The current medical regimen is effective;  continue present plan and medications: losartan 100 mg daily.      Relevant Orders  Comprehensive metabolic panel (Completed)   CBC with Differential/Platelet (Completed)     Other   Iron deficiency anemia (Chronic)    The current medical regimen is effective;  continue present plan and medications.      Mixed hyperlipidemia    Well controlled.  No changes to medicines.  Continue to work on eating a healthy diet and exercise.  Labs drawn today.       Relevant Orders   Lipid panel (Completed)   Vitamin D deficiency    The current medical regimen is  effective;  continue present plan and medications.      Relevant Orders   VITAMIN D 25 Hydroxy (Vit-D Deficiency, Fractures) (Completed)   Subacute cough    Ordered Chest 2 view xray.      Relevant Orders   DG Chest 2 View  .  Orders Placed This Encounter  Procedures   DG Chest 2 View   Comprehensive metabolic panel   Hemoglobin A1c   Lipid panel   CBC with Differential/Platelet   Microalbumin / creatinine urine ratio   VITAMIN D 25 Hydroxy (Vit-D Deficiency, Fractures)   Cardiovascular Risk Assessment    Total time spent on today's visit was greater than 30 minutes, including both face-to-face time and nonface-to-face time personally spent on review of chart (labs and imaging), discussing labs and goals, discussing further work-up, treatment options, referrals to specialist if needed, reviewing outside records of pertinent, answering patient's questions, and coordinating care.  I,Kirkland Figg,acting as a Education administrator for [name], MD.,have documented all relevant documentation on the behalf of [name], MD,as directed by  [name], MD while in the presence of [name], MD.   Follow-up: Return in about 3 months (around 10/02/2021) for chronic fasting.  An After Visit Summary was printed and given to the patient.  [name], MD [facility] [phone]

## 2021-07-04 ENCOUNTER — Ambulatory Visit (INDEPENDENT_AMBULATORY_CARE_PROVIDER_SITE_OTHER): Payer: PPO | Admitting: Family Medicine

## 2021-07-04 ENCOUNTER — Encounter: Payer: Self-pay | Admitting: Family Medicine

## 2021-07-04 ENCOUNTER — Other Ambulatory Visit: Payer: Self-pay

## 2021-07-04 VITALS — BP 120/60 | HR 76 | Temp 98.6°F | Resp 16 | Ht 61.42 in | Wt 138.0 lb

## 2021-07-04 DIAGNOSIS — I129 Hypertensive chronic kidney disease with stage 1 through stage 4 chronic kidney disease, or unspecified chronic kidney disease: Secondary | ICD-10-CM

## 2021-07-04 DIAGNOSIS — E559 Vitamin D deficiency, unspecified: Secondary | ICD-10-CM

## 2021-07-04 DIAGNOSIS — I152 Hypertension secondary to endocrine disorders: Secondary | ICD-10-CM

## 2021-07-04 DIAGNOSIS — R946 Abnormal results of thyroid function studies: Secondary | ICD-10-CM | POA: Diagnosis not present

## 2021-07-04 DIAGNOSIS — K219 Gastro-esophageal reflux disease without esophagitis: Secondary | ICD-10-CM

## 2021-07-04 DIAGNOSIS — R052 Subacute cough: Secondary | ICD-10-CM | POA: Diagnosis not present

## 2021-07-04 DIAGNOSIS — E782 Mixed hyperlipidemia: Secondary | ICD-10-CM | POA: Diagnosis not present

## 2021-07-04 DIAGNOSIS — N1832 Chronic kidney disease, stage 3b: Secondary | ICD-10-CM

## 2021-07-04 DIAGNOSIS — R059 Cough, unspecified: Secondary | ICD-10-CM | POA: Diagnosis not present

## 2021-07-04 DIAGNOSIS — E785 Hyperlipidemia, unspecified: Secondary | ICD-10-CM | POA: Diagnosis not present

## 2021-07-04 DIAGNOSIS — E1169 Type 2 diabetes mellitus with other specified complication: Secondary | ICD-10-CM | POA: Diagnosis not present

## 2021-07-04 DIAGNOSIS — D508 Other iron deficiency anemias: Secondary | ICD-10-CM

## 2021-07-04 DIAGNOSIS — E1159 Type 2 diabetes mellitus with other circulatory complications: Secondary | ICD-10-CM | POA: Diagnosis not present

## 2021-07-04 DIAGNOSIS — E1121 Type 2 diabetes mellitus with diabetic nephropathy: Secondary | ICD-10-CM

## 2021-07-05 DIAGNOSIS — M25561 Pain in right knee: Secondary | ICD-10-CM | POA: Diagnosis not present

## 2021-07-05 DIAGNOSIS — M25461 Effusion, right knee: Secondary | ICD-10-CM | POA: Diagnosis not present

## 2021-07-05 DIAGNOSIS — R2689 Other abnormalities of gait and mobility: Secondary | ICD-10-CM | POA: Diagnosis not present

## 2021-07-05 DIAGNOSIS — Z4733 Aftercare following explantation of knee joint prosthesis: Secondary | ICD-10-CM | POA: Diagnosis not present

## 2021-07-05 DIAGNOSIS — M62551 Muscle wasting and atrophy, not elsewhere classified, right thigh: Secondary | ICD-10-CM | POA: Diagnosis not present

## 2021-07-05 DIAGNOSIS — Z96651 Presence of right artificial knee joint: Secondary | ICD-10-CM | POA: Diagnosis not present

## 2021-07-05 LAB — CBC WITH DIFFERENTIAL/PLATELET
Basophils Absolute: 0 10*3/uL (ref 0.0–0.2)
Basos: 1 %
EOS (ABSOLUTE): 0.2 10*3/uL (ref 0.0–0.4)
Eos: 3 %
Hematocrit: 39.5 % (ref 34.0–46.6)
Hemoglobin: 12.9 g/dL (ref 11.1–15.9)
Immature Grans (Abs): 0 10*3/uL (ref 0.0–0.1)
Immature Granulocytes: 0 %
Lymphocytes Absolute: 1.3 10*3/uL (ref 0.7–3.1)
Lymphs: 19 %
MCH: 30.4 pg (ref 26.6–33.0)
MCHC: 32.7 g/dL (ref 31.5–35.7)
MCV: 93 fL (ref 79–97)
Monocytes Absolute: 0.3 10*3/uL (ref 0.1–0.9)
Monocytes: 5 %
Neutrophils Absolute: 5 10*3/uL (ref 1.4–7.0)
Neutrophils: 72 %
Platelets: 229 10*3/uL (ref 150–450)
RBC: 4.24 x10E6/uL (ref 3.77–5.28)
RDW: 13.5 % (ref 11.7–15.4)
WBC: 6.8 10*3/uL (ref 3.4–10.8)

## 2021-07-05 LAB — COMPREHENSIVE METABOLIC PANEL
ALT: 10 IU/L (ref 0–32)
AST: 16 IU/L (ref 0–40)
Albumin/Globulin Ratio: 1.6 (ref 1.2–2.2)
Albumin: 4.5 g/dL (ref 3.7–4.7)
Alkaline Phosphatase: 131 IU/L — ABNORMAL HIGH (ref 44–121)
BUN/Creatinine Ratio: 11 — ABNORMAL LOW (ref 12–28)
BUN: 13 mg/dL (ref 8–27)
Bilirubin Total: 0.5 mg/dL (ref 0.0–1.2)
CO2: 24 mmol/L (ref 20–29)
Calcium: 10.1 mg/dL (ref 8.7–10.3)
Chloride: 103 mmol/L (ref 96–106)
Creatinine, Ser: 1.22 mg/dL — ABNORMAL HIGH (ref 0.57–1.00)
Globulin, Total: 2.8 g/dL (ref 1.5–4.5)
Glucose: 156 mg/dL — ABNORMAL HIGH (ref 70–99)
Potassium: 4.5 mmol/L (ref 3.5–5.2)
Sodium: 142 mmol/L (ref 134–144)
Total Protein: 7.3 g/dL (ref 6.0–8.5)
eGFR: 47 mL/min/{1.73_m2} — ABNORMAL LOW (ref >59)

## 2021-07-05 LAB — MICROALBUMIN / CREATININE URINE RATIO
Creatinine, Urine: 355.2 mg/dL
Microalb/Creat Ratio: 8 mg/g creat (ref 0–29)
Microalbumin, Urine: 27.9 ug/mL

## 2021-07-05 LAB — VITAMIN D 25 HYDROXY (VIT D DEFICIENCY, FRACTURES): Vit D, 25-Hydroxy: 32.3 ng/mL (ref 30.0–100.0)

## 2021-07-05 LAB — LIPID PANEL
Chol/HDL Ratio: 3.6 ratio (ref 0.0–4.4)
Cholesterol, Total: 162 mg/dL (ref 100–199)
HDL: 45 mg/dL (ref >39)
LDL Chol Calc (NIH): 85 mg/dL (ref 0–99)
Triglycerides: 191 mg/dL — ABNORMAL HIGH (ref 0–149)
VLDL Cholesterol Cal: 32 mg/dL (ref 5–40)

## 2021-07-05 LAB — HEMOGLOBIN A1C
Est. average glucose Bld gHb Est-mCnc: 148 mg/dL
Hgb A1c MFr Bld: 6.8 % — ABNORMAL HIGH (ref 4.8–5.6)

## 2021-07-06 DIAGNOSIS — R052 Subacute cough: Secondary | ICD-10-CM | POA: Insufficient documentation

## 2021-07-06 NOTE — Assessment & Plan Note (Signed)
The current medical regimen is effective;  continue present plan and medications.  

## 2021-07-06 NOTE — Assessment & Plan Note (Signed)
Well controlled.  ?No changes to medicines.  ?Continue to work on eating a healthy diet and exercise.  ?Labs drawn today.  ?

## 2021-07-06 NOTE — Assessment & Plan Note (Signed)
The current medical regimen is effective;  continue present plan and medications: losartan 100 mg daily.

## 2021-07-06 NOTE — Assessment & Plan Note (Signed)
Control: Fair controlled.  Recommend check sugars fasting daily. Recommend check feet daily. Recommend annual eye exams. Medicines: Taking januvia  100 mg daily. She was not taking farxiga 10 mg due to misunderstanding but she will start it. Continue to work on eating a healthy diet and exercise.  Labs drawn today.

## 2021-07-06 NOTE — Assessment & Plan Note (Signed)
Ordered Chest 2 view xray.

## 2021-07-09 ENCOUNTER — Other Ambulatory Visit: Payer: Self-pay

## 2021-07-09 DIAGNOSIS — M62551 Muscle wasting and atrophy, not elsewhere classified, right thigh: Secondary | ICD-10-CM | POA: Diagnosis not present

## 2021-07-09 DIAGNOSIS — M25461 Effusion, right knee: Secondary | ICD-10-CM | POA: Diagnosis not present

## 2021-07-09 DIAGNOSIS — M25561 Pain in right knee: Secondary | ICD-10-CM | POA: Diagnosis not present

## 2021-07-09 DIAGNOSIS — R052 Subacute cough: Secondary | ICD-10-CM

## 2021-07-09 DIAGNOSIS — R2689 Other abnormalities of gait and mobility: Secondary | ICD-10-CM | POA: Diagnosis not present

## 2021-07-09 DIAGNOSIS — Z96651 Presence of right artificial knee joint: Secondary | ICD-10-CM | POA: Diagnosis not present

## 2021-07-09 DIAGNOSIS — Z4733 Aftercare following explantation of knee joint prosthesis: Secondary | ICD-10-CM | POA: Diagnosis not present

## 2021-07-10 LAB — T4, FREE: Free T4: 1.31 ng/dL (ref 0.82–1.77)

## 2021-07-10 LAB — TSH: TSH: 1.89 u[IU]/mL (ref 0.450–4.500)

## 2021-07-10 LAB — SPECIMEN STATUS REPORT

## 2021-07-11 DIAGNOSIS — M62551 Muscle wasting and atrophy, not elsewhere classified, right thigh: Secondary | ICD-10-CM | POA: Diagnosis not present

## 2021-07-11 DIAGNOSIS — Z96651 Presence of right artificial knee joint: Secondary | ICD-10-CM | POA: Diagnosis not present

## 2021-07-11 DIAGNOSIS — M25461 Effusion, right knee: Secondary | ICD-10-CM | POA: Diagnosis not present

## 2021-07-11 DIAGNOSIS — Z4733 Aftercare following explantation of knee joint prosthesis: Secondary | ICD-10-CM | POA: Diagnosis not present

## 2021-07-11 DIAGNOSIS — R2689 Other abnormalities of gait and mobility: Secondary | ICD-10-CM | POA: Diagnosis not present

## 2021-07-11 DIAGNOSIS — M25561 Pain in right knee: Secondary | ICD-10-CM | POA: Diagnosis not present

## 2021-07-12 ENCOUNTER — Other Ambulatory Visit: Payer: Self-pay

## 2021-07-12 MED ORDER — ICOSAPENT ETHYL 1 G PO CAPS: 2.0000 g | ORAL_CAPSULE | Freq: Two times a day (BID) | ORAL | 2 refills | Status: DC

## 2021-07-12 MED ORDER — DAPAGLIFLOZIN PROPANEDIOL 10 MG PO TABS: 10.0000 mg | ORAL_TABLET | Freq: Every day | ORAL | 3 refills | Status: DC

## 2021-07-15 ENCOUNTER — Telehealth: Payer: Self-pay

## 2021-07-15 NOTE — Telephone Encounter (Signed)
Patient's husband called about recent medication changes/recommendations for wife, Alicia Jordan.  He is concerned about cost and the fact that he just spent over a hundred dollars on Januvia.  Please call to discuss recommendations and patient assistance.  Please return call to [place] to discuss [phone]

## 2021-07-16 ENCOUNTER — Telehealth: Payer: Self-pay

## 2021-07-16 DIAGNOSIS — M62551 Muscle wasting and atrophy, not elsewhere classified, right thigh: Secondary | ICD-10-CM | POA: Diagnosis not present

## 2021-07-16 DIAGNOSIS — M25461 Effusion, right knee: Secondary | ICD-10-CM | POA: Diagnosis not present

## 2021-07-16 DIAGNOSIS — R2689 Other abnormalities of gait and mobility: Secondary | ICD-10-CM | POA: Diagnosis not present

## 2021-07-16 DIAGNOSIS — M25561 Pain in right knee: Secondary | ICD-10-CM | POA: Diagnosis not present

## 2021-07-16 DIAGNOSIS — Z96651 Presence of right artificial knee joint: Secondary | ICD-10-CM | POA: Diagnosis not present

## 2021-07-16 DIAGNOSIS — Z4733 Aftercare following explantation of knee joint prosthesis: Secondary | ICD-10-CM | POA: Diagnosis not present

## 2021-07-16 NOTE — Telephone Encounter (Signed)
Patient paid for Januvia ($100) despite no longer being on it anymore. Would like to use [facility] to prevent this from happening again in the future. Will onboard later next month  Verbal consent obtained for [facility] enhanced pharmacy services (medication synchronization, adherence packaging, delivery coordination). A medication sync plan was created to allow patient to get all medications delivered once every 30 to 90 days per patient preference. Patient understands they have freedom to choose pharmacy and clinical pharmacist will coordinate care between all prescribers and [facility].

## 2021-07-18 DIAGNOSIS — R2689 Other abnormalities of gait and mobility: Secondary | ICD-10-CM | POA: Diagnosis not present

## 2021-07-18 DIAGNOSIS — M62551 Muscle wasting and atrophy, not elsewhere classified, right thigh: Secondary | ICD-10-CM | POA: Diagnosis not present

## 2021-07-18 DIAGNOSIS — M25561 Pain in right knee: Secondary | ICD-10-CM | POA: Diagnosis not present

## 2021-07-18 DIAGNOSIS — Z4733 Aftercare following explantation of knee joint prosthesis: Secondary | ICD-10-CM | POA: Diagnosis not present

## 2021-07-18 DIAGNOSIS — M25461 Effusion, right knee: Secondary | ICD-10-CM | POA: Diagnosis not present

## 2021-07-18 DIAGNOSIS — Z96651 Presence of right artificial knee joint: Secondary | ICD-10-CM | POA: Diagnosis not present

## 2021-07-23 DIAGNOSIS — Z96651 Presence of right artificial knee joint: Secondary | ICD-10-CM | POA: Diagnosis not present

## 2021-07-23 DIAGNOSIS — M25461 Effusion, right knee: Secondary | ICD-10-CM | POA: Diagnosis not present

## 2021-07-23 DIAGNOSIS — R2689 Other abnormalities of gait and mobility: Secondary | ICD-10-CM | POA: Diagnosis not present

## 2021-07-23 DIAGNOSIS — Z4733 Aftercare following explantation of knee joint prosthesis: Secondary | ICD-10-CM | POA: Diagnosis not present

## 2021-07-23 DIAGNOSIS — M62551 Muscle wasting and atrophy, not elsewhere classified, right thigh: Secondary | ICD-10-CM | POA: Diagnosis not present

## 2021-07-23 DIAGNOSIS — M25561 Pain in right knee: Secondary | ICD-10-CM | POA: Diagnosis not present

## 2021-07-25 DIAGNOSIS — M25561 Pain in right knee: Secondary | ICD-10-CM | POA: Diagnosis not present

## 2021-07-25 DIAGNOSIS — M62551 Muscle wasting and atrophy, not elsewhere classified, right thigh: Secondary | ICD-10-CM | POA: Diagnosis not present

## 2021-07-25 DIAGNOSIS — M25461 Effusion, right knee: Secondary | ICD-10-CM | POA: Diagnosis not present

## 2021-07-25 DIAGNOSIS — Z4733 Aftercare following explantation of knee joint prosthesis: Secondary | ICD-10-CM | POA: Diagnosis not present

## 2021-07-25 DIAGNOSIS — R2689 Other abnormalities of gait and mobility: Secondary | ICD-10-CM | POA: Diagnosis not present

## 2021-07-25 DIAGNOSIS — Z96651 Presence of right artificial knee joint: Secondary | ICD-10-CM | POA: Diagnosis not present

## 2021-07-30 DIAGNOSIS — M62551 Muscle wasting and atrophy, not elsewhere classified, right thigh: Secondary | ICD-10-CM | POA: Diagnosis not present

## 2021-07-30 DIAGNOSIS — Z4733 Aftercare following explantation of knee joint prosthesis: Secondary | ICD-10-CM | POA: Diagnosis not present

## 2021-07-30 DIAGNOSIS — Z96651 Presence of right artificial knee joint: Secondary | ICD-10-CM | POA: Diagnosis not present

## 2021-07-30 DIAGNOSIS — M25561 Pain in right knee: Secondary | ICD-10-CM | POA: Diagnosis not present

## 2021-07-30 DIAGNOSIS — R2689 Other abnormalities of gait and mobility: Secondary | ICD-10-CM | POA: Diagnosis not present

## 2021-07-30 DIAGNOSIS — M25461 Effusion, right knee: Secondary | ICD-10-CM | POA: Diagnosis not present

## 2021-08-01 ENCOUNTER — Telehealth: Payer: Self-pay

## 2021-08-01 DIAGNOSIS — R2689 Other abnormalities of gait and mobility: Secondary | ICD-10-CM | POA: Diagnosis not present

## 2021-08-01 DIAGNOSIS — Z4733 Aftercare following explantation of knee joint prosthesis: Secondary | ICD-10-CM | POA: Diagnosis not present

## 2021-08-01 DIAGNOSIS — M25561 Pain in right knee: Secondary | ICD-10-CM | POA: Diagnosis not present

## 2021-08-01 DIAGNOSIS — Z96651 Presence of right artificial knee joint: Secondary | ICD-10-CM | POA: Diagnosis not present

## 2021-08-01 DIAGNOSIS — M62551 Muscle wasting and atrophy, not elsewhere classified, right thigh: Secondary | ICD-10-CM | POA: Diagnosis not present

## 2021-08-01 DIAGNOSIS — M25461 Effusion, right knee: Secondary | ICD-10-CM | POA: Diagnosis not present

## 2021-08-01 NOTE — Chronic Care Management (AMB) (Signed)
°  Alicia Jordan was reminded to have all medications, supplements and any blood glucose and blood pressure readings available for review with Arizona Constable, Pharm. D, at her telephone visit on 08-06-2021 at 1:00.  Questions: Have you had any recent office visit or specialist visit outside of [place]? Patient stated no  Are there any concerns you would like to discuss during your office visit? Patient stated   Are you having any problems obtaining your medications? (Whether it pharmacy issues or cost) Patient stated no  If patient has any PAP medications ask if they are having any problems getting their PAP medication or refill? No PAP medication  Care Gaps: Hep C screening overdue Shingrix overdue Yearly ophthalmology  Star Rating Drug: Simvastatin 40 mg- Last filled 05-06-2021 90 DS Farxiga 07-12-2021 90 DS  Any gaps in medications fill history? No   [place] Pharmacist Assistant [phone]

## 2021-08-02 DIAGNOSIS — M9904 Segmental and somatic dysfunction of sacral region: Secondary | ICD-10-CM | POA: Diagnosis not present

## 2021-08-02 DIAGNOSIS — M5136 Other intervertebral disc degeneration, lumbar region: Secondary | ICD-10-CM | POA: Diagnosis not present

## 2021-08-02 DIAGNOSIS — M5442 Lumbago with sciatica, left side: Secondary | ICD-10-CM | POA: Diagnosis not present

## 2021-08-02 DIAGNOSIS — M9903 Segmental and somatic dysfunction of lumbar region: Secondary | ICD-10-CM | POA: Diagnosis not present

## 2021-08-06 ENCOUNTER — Other Ambulatory Visit: Payer: Self-pay

## 2021-08-06 ENCOUNTER — Ambulatory Visit (INDEPENDENT_AMBULATORY_CARE_PROVIDER_SITE_OTHER): Payer: PPO

## 2021-08-06 DIAGNOSIS — E782 Mixed hyperlipidemia: Secondary | ICD-10-CM

## 2021-08-06 DIAGNOSIS — I152 Hypertension secondary to endocrine disorders: Secondary | ICD-10-CM

## 2021-08-06 DIAGNOSIS — E1121 Type 2 diabetes mellitus with diabetic nephropathy: Secondary | ICD-10-CM

## 2021-08-06 DIAGNOSIS — E1159 Type 2 diabetes mellitus with other circulatory complications: Secondary | ICD-10-CM

## 2021-08-06 DIAGNOSIS — I10 Essential (primary) hypertension: Secondary | ICD-10-CM

## 2021-08-06 NOTE — Progress Notes (Signed)
Chronic Care Management Pharmacy Note  08/06/2021 Name:  Alicia Jordan MRN:  [id number] DOB:  August 11, 1945   Plan Updates:  Onboard to [facility] Pantoprazole written QD but patient taking BID, will ask PCP which she'd prefer patient to be on  Subjective: Alicia Jordan is an 76 y.o. year old female who is a primary patient of Cox, Kirsten, MD.  The CCM team was consulted for assistance with disease management and care coordination needs.    Engaged with patient face to face for follow up visit in response to provider referral for pharmacy case management and/or care coordination services.   Consent to Services:  The patient was given information about Chronic Care Management services, agreed to services, and gave verbal consent prior to initiation of services.  Please see initial visit note for detailed documentation.   Patient Care Team: [name], MD as PCP - General (Family Medicine) Jackquline Denmark, MD as Consulting Physician (Gastroenterology) Joya Salm, MD as Referring Physician (Orthopedic Surgery) Vickey Huger, MD as Consulting Physician (Orthopedic Surgery) Lane Hacker, [facility] (Pharmacist)   Recent office visits:  12/31/20 - Dr. Tobie Poet changed Alicia Jordan to 10/500 mg daily due to nephrotlogy recommendation.  12/21/20- Dr. Tobie Poet- PCP. Recommended to take Iron 325 mg twice daily. No other medication changes. Referred to nephrology and gastroenterology. Follow up in 3 months. 12/14/20- Dr. Tobie Poet- PCP. Ordered CBC with diff, CMP, A1C, Urinalysis/culture, TSH, Lipid panel, MRSA culture, PT-INR. Recommended Iron 325 mg once daily. No changes to prescribed medications.    Recent consult visits:  01/02/21 - GI - decrease Protonix 40 mg daily. Avoid NSAIDs. EGD/Colonoscopy ordered with Miralax prep.  12/05/20 - Nephrology - avoid NSAIDs. Continue with losartan and [place].    Hospital visits:  None in previous 6 months     Objective:  Lab Results  Component Value  Date   CREATININE 1.22 (H) 07/04/2021   BUN 13 07/04/2021   GFRNONAA 41 (L) 05/04/2020   GFRAA 47 (L) 05/04/2020   NA 142 07/04/2021   K 4.5 07/04/2021   CALCIUM 10.1 07/04/2021   CO2 24 07/04/2021   GLUCOSE 156 (H) 07/04/2021    Lab Results  Component Value Date/Time   HGBA1C 6.8 (H) 07/04/2021 09:49 AM   HGBA1C 7.3 (H) 03/29/2021 09:10 AM   MICROALBUR 30 03/29/2021 08:59 AM   MICROALBUR 30 05/04/2020 09:13 AM    Last diabetic Eye exam:  Lab Results  Component Value Date/Time   HMDIABEYEEXA No Retinopathy 02/09/2020 12:00 AM    Last diabetic Foot exam: No results found for: HMDIABFOOTEX   Lab Results  Component Value Date   CHOL 162 07/04/2021   HDL 45 07/04/2021   LDLCALC 85 07/04/2021   TRIG 191 (H) 07/04/2021   CHOLHDL 3.6 07/04/2021    Hepatic Function Latest Ref Rng & Units 07/04/2021 03/29/2021 02/01/2021  Total Protein 6.0 - 8.5 g/dL 7.3 7.2 -  Albumin 3.7 - 4.7 g/dL 4.5 4.7 4.6  AST 0 - 40 IU/L $Remov'16 14 23  'hFrhAy$ ALT 0 - 32 IU/L $Remov'10 9 13  'dMNkxC$ Alk Phosphatase 44 - 121 IU/L 131(H) 66 60  Total Bilirubin 0.0 - 1.2 mg/dL 0.5 0.9 -    Lab Results  Component Value Date/Time   TSH 1.890 07/04/2021 09:49 AM   TSH 4.890 (H) 12/14/2020 09:45 AM   FREET4 1.31 07/04/2021 09:49 AM    CBC Latest Ref Rng & Units 07/04/2021 03/29/2021 02/01/2021  WBC 3.4 - 10.8 x10E3/uL 6.8 5.5 5.7  Hemoglobin  11.1 - 15.9 g/dL 12.9 14.2 13.0  Hematocrit 34.0 - 46.6 % 39.5 45.1 41  Platelets 150 - 450 x10E3/uL 229 175 156    Lab Results  Component Value Date/Time   VD25OH 32.3 07/04/2021 09:49 AM   VD25OH 28.0 (L) 03/29/2021 09:10 AM    Clinical ASCVD: Yes  The 10-year ASCVD risk score (Arnett DK, et al., 2019) is: 26.9%   Values used to calculate the score:     Age: 76 years     Sex: Female     Is Non-Hispanic African American: No     Diabetic: Yes     Tobacco smoker: No     Systolic Blood Pressure: 250 mmHg     Is BP treated: Yes     HDL Cholesterol: 45 mg/dL     Total Cholesterol:  162 mg/dL    Depression screen [facility] 2/9 07/06/2021 03/29/2021 12/21/2020  Decreased Interest 0 0 0  Down, Depressed, Hopeless 0 1 0  PHQ - 2 Score 0 1 0  Altered sleeping 1 1 -  Tired, decreased energy 1 0 -  Change in appetite 1 1 -  Feeling bad or failure about yourself  0 0 -  Trouble concentrating 0 0 -  Moving slowly or fidgety/restless 0 0 -  Suicidal thoughts 0 0 -  PHQ-9 Score 3 3 -  Difficult doing work/chores Not difficult at all Not difficult at all -     Other: (CHADS2VASc if Afib, MMRC or CAT for COPD, ACT, DEXA)  Social History   Tobacco Use  Smoking Status Never  Smokeless Tobacco Never   BP Readings from Last 3 Encounters:  07/06/21 120/60  06/26/21 120/60  05/20/21 110/80   Pulse Readings from Last 3 Encounters:  07/06/21 76  06/26/21 96  05/20/21 77   Wt Readings from Last 3 Encounters:  07/06/21 138 lb (62.6 kg)  06/26/21 142 lb (64.4 kg)  05/20/21 144 lb 3.2 oz (65.4 kg)   BMI Readings from Last 3 Encounters:  07/06/21 25.72 kg/m  06/26/21 26.47 kg/m  05/20/21 26.37 kg/m    Assessment/Interventions: Review of patient past medical history, allergies, medications, health status, including review of consultants reports, laboratory and other test data, was performed as part of comprehensive evaluation and provision of chronic care management services.   SDOH:  (Social Determinants of Health) assessments and interventions performed: Yes SDOH Interventions    Flowsheet Row Most Recent Value  SDOH Interventions   Financial Strain Interventions Intervention Not Indicated  Transportation Interventions Intervention Not Indicated      SDOH Screenings   Alcohol Screen: Not on file  Depression (PHQ2-9): Low Risk    PHQ-2 Score: 3  Financial Resource Strain: Low Risk    Difficulty of Paying Living Expenses: Not hard at all  Food Insecurity: Not on file  Housing: [place] Risk Score: 0  Physical Activity: Not on file  Social  Connections: Not on file  Stress: Stress Concern Present   Feeling of Stress : To some extent  Tobacco Use: Low Risk    Smoking Tobacco Use: Never   Smokeless Tobacco Use: Never   Passive Exposure: Not on file  Transportation Needs: No Transportation Needs   Lack of Transportation (Medical): No   Lack of Transportation (Non-Medical): No    CCM Care Plan  Allergies  Allergen Reactions   Meloxicam     Medications Reviewed Today     Reviewed by Lane Hacker, RPH (  Pharmacist) on 08/06/21 at 1323  Med List Status: <None>   Medication Order Taking? Sig Documenting Provider Last Dose Status Informant  Acetaminophen (TYLENOL PO) [id number]  Take by mouth as needed. [provider]  Active   benzonatate (TESSALON) 100 MG capsule [id number]  Take 1 capsule (100 mg total) by mouth 2 (two) times daily as needed for cough. Marge Duncans, PA-C  Active   cefdinir (OMNICEF) 300 MG capsule [id number] No Take 1 capsule (300 mg total) by mouth 2 (two) times daily.  Patient not taking: Reported on 08/06/2021   Marge Duncans, PA-C Not Taking Consider Medication Status and Discontinue   Cetirizine HCl (ZYRTEC ALLERGY) 10 MG CAPS [id number]  Take by mouth daily. [provider]  Active   Cholecalciferol (VITAMIN D3) 25 MCG (1000 UT) CAPS [id number]  Take 1 capsule by mouth daily. [provider]  Active   dapagliflozin propanediol (FARXIGA) 10 MG TABS tablet [id number]  Take 1 tablet (10 mg total) by mouth daily before breakfast. Cox, Kirsten, MD  Active   diphenhydrAMINE (BENADRYL) 25 mg capsule [id number]  Take 25 mg by mouth every 6 (six) hours as needed. [provider]  Active   Ferrous Sulfate (IRON PO) [id number]  Take 2 tablets by mouth daily in the afternoon. [provider]  Active   FLUoxetine (PROZAC) 20 MG capsule [id number]  TAKE 3 CAPSULES BY MOUTH ONCE DAILY Cox, Kirsten, MD  Active   icosapent Ethyl (VASCEPA) 1 g capsule [id number]  Take 2 capsules (2 g  total) by mouth 2 (two) times daily. Cox, Kirsten, MD  Active   losartan (COZAAR) 100 MG tablet [id number]  Take 1 tablet by mouth once daily Cox, Kirsten, MD  Active   methocarbamol (ROBAXIN) 500 MG tablet [id number]  Take 500-1,000 mg by mouth 4 (four) times daily. [provider]  Active   oxyCODONE (OXY IR/ROXICODONE) 5 MG immediate release tablet [id number]  Take 5 mg by mouth every 4 (four) hours as needed. [provider]  Active   pantoprazole (PROTONIX) 40 MG tablet [id number]  Take 1 tablet (40 mg total) by mouth daily. [name], MD  Active   simvastatin (ZOCOR) 40 MG tablet [id number] Yes TAKE 1 TABLET BY MOUTH AT BEDTIME [name], MD Taking Active             Patient Active Problem List   Diagnosis Date Noted   Subacute cough 07/06/2021   Mild recurrent major depression ([place]) 04/14/2021   Preoperative clearance 04/14/2021   Vitamin D deficiency 03/31/2021   Primary osteoarthritis of both knees 03/31/2021   Left carpal tunnel syndrome 03/31/2021   Diabetic glomerulopathy ([place]) 03/31/2021   Iron deficiency anemia 02/09/2021   B12 deficiency 02/09/2021   Hypertensive kidney disease with stage 3b chronic kidney disease ([place]) 09/15/2020   Chronic pain of both knees 04/17/2020   Chronic right shoulder pain 08/21/2019   Dyslipidemia associated with type 2 diabetes mellitus ([place]) 08/11/2019   Hypertension associated with diabetes ([place]) 08/11/2019   Mixed hyperlipidemia 08/11/2019   GERD without esophagitis 04/18/2009    Immunization History  Administered Date(s) Administered   Fluad Quad(high Dose 65+) 02/08/2019, 03/29/2021   Influenza-Unspecified 02/27/2020   Moderna Sars-Covid-2 Vaccination 10/31/2019, 11/28/2019, 05/31/2020, 10/12/2020   Pfizer Covid-19 Vaccine Bivalent Booster 43yrs & up 03/29/2021   Pneumococcal Conjugate-13 05/13/2014   Pneumococcal Polysaccharide-23 01/29/2017   Tdap 05/03/2012   Zoster Recombinat (Shingrix) 06/11/2017    Zoster, Live 05/16/2010    Conditions to be addressed/monitored:  Hypertension, Hyperlipidemia and Diabetes  Care Plan : [place]  Updates made by Lane Hacker, RPH since 08/06/2021 12:00 AM     Problem: htn, hld, dm   Priority: High  Onset Date: 11/13/2020     Long-Range Goal: Disease State Management   Start Date: 11/13/2020  Expected End Date: 11/13/2021  Recent Progress: On track  Priority: High  Note:   Current Barriers: Unable to independently afford treatment regimen   Pharmacist Clinical Goal(s):  Patient will verbalize ability to afford treatment regimen through collaboration with PharmD and provider.   Interventions: 1:1 collaboration with [name], MD regarding development and update of comprehensive plan of care as evidenced by provider attestation and co-signature Inter-disciplinary care team collaboration (see longitudinal plan of care) Comprehensive medication review performed; medication list updated in electronic medical record   Hypertension (BP goal <130/80) BP Readings from Last 3 Encounters:  07/06/21 120/60  06/26/21 120/60  05/20/21 110/80  -Controlled. Diagnosed >25 years ago.  -Current treatment: Losartan 100 mg daily Appropriate, Effective, Safe, Accessible -Medications previously tried: Losartan-hydrochlorothiazide -Current home readings: well controlled -Current dietary habits: eats lunch out and working to [employer] -Current exercise habits: [place] [employer] -Denies hypotensive/hypertensive symptoms -Educated on BP goals and benefits of medications for prevention of heart attack, stroke and kidney damage; Daily salt intake goal < 2300 mg; Exercise goal of 150 minutes per week; -Counseled to monitor BP at home as needed, document, and provide log at future appointments -Counseled on diet and exercise extensively Recommended to continue current medication   Hyperlipidemia: (LDL goal <  70) The 10-year ASCVD risk score (Arnett DK, et al., 2019) is: 26.9%   Values used to calculate the score:     Age: 74 years     Sex: Female     Is Non-Hispanic African American: No     Diabetic: Yes     Tobacco smoker: No     Systolic Blood Pressure: 032 mmHg     Is BP treated: Yes     HDL Cholesterol: 45 mg/dL     Total Cholesterol: 162 mg/dL Lab Results  Component Value Date   CHOL 162 07/04/2021   CHOL 179 03/29/2021   CHOL 139 12/14/2020   Lab Results  Component Value Date   HDL 45 07/04/2021   HDL 49 03/29/2021   HDL 53 12/14/2020   Lab Results  Component Value Date   LDLCALC 85 07/04/2021   LDLCALC 93 03/29/2021   LDLCALC 65 12/14/2020   Lab Results  Component Value Date   TRIG 191 (H) 07/04/2021   TRIG 217 (H) 03/29/2021   TRIG 116 12/14/2020   Lab Results  Component Value Date   CHOLHDL 3.6 07/04/2021   CHOLHDL 3.7 03/29/2021   CHOLHDL 2.6 12/14/2020  No results found for: LDLDIRECT -Not ideally controlled -Current treatment: Simvastatin 40 mg daily at bedtime Appropriate, Effective, Query Safe, Accessible Vascepa 2 BID Query Appropriate, Query effective, Query Safe, Query accessible -Medications previously tried: none reported -Current dietary patterns: eats out at lunch. Trying to make smarter decisions -Current exercise habits: [place] [employer] -Educated on Cholesterol goals; Benefits of statin for ASCVD risk reduction; Importance of limiting foods high in cholesterol; Exercise goal of 150 minutes per week; -Counseled on diet and exercise extensively Feb 2023: Recommend Atorvastatin in case renal function worsens. Patient not taking Vascepa due to fears of it being a "High dose."  Used metaphor explaining how 1000 feathers still weighs less than 100 bricks and that you can't determine how "strong" a med is based on the mg   Diabetes (A1c goal <7%) Lab Results  Component Value Date   HGBA1C 6.8 (H) 07/04/2021   HGBA1C 7.3  (H) 03/29/2021   HGBA1C 7.1 (H) 12/14/2020   Lab Results  Component Value Date   MICROALBUR 30 03/29/2021   LDLCALC 85 07/04/2021   CREATININE 1.22 (H) 07/04/2021   Lab Results  Component Value Date   NA 142 07/04/2021   K 4.5 07/04/2021   CREATININE 1.22 (H) 07/04/2021   EGFR 47 (L) 07/04/2021   GFRNONAA 41 (L) 05/04/2020   GLUCOSE 156 (H) 07/04/2021   Lab Results  Component Value Date   WBC 6.8 07/04/2021   HGB 12.9 07/04/2021   HCT 39.5 07/04/2021   MCV 93 07/04/2021   PLT 229 07/04/2021   -Not ideally controlled -Current medications: Farxiga 10mg  Appropriate, Effective, Safe, Accessible -Medications previously tried: [place] -Current home glucose readings fasting glucose: ~130 mg/dL post prandial glucose: not checking -Denies hypoglycemic/hyperglycemic symptoms -Current meal patterns: Trying to make smart choices. Eats out with daughter during lunch some and sandwich or simple lunch other days.Cereal for breakfast.  -Current exercise: [place] [employer] but unable to currently due to anemia.  -Educated on A1c and blood sugar goals; Complications of diabetes including kidney damage, retinal damage, and cardiovascular disease; Exercise goal of 150 minutes per week; Benefits of weight loss; Benefits of routine self-monitoring of blood sugar; Carbohydrate counting and/or plate method -Counseled to check feet daily and get yearly eye exams -Counseled on diet and exercise extensively Recommended to continue current medication     Patient Goals/Self-Care Activities Patient will: - take medications as prescribed focus on medication adherence by using pill box check glucose daily, document, and provide at future appointments target a minimum of 150 minutes of moderate intensity exercise weekly  Follow Up Plan: Telephone follow up appointment with care management team member scheduled for: ~01/2022  [place] Constable,  Pharm.D. - [phone]        Medication Assistance:  Xigduo obtained through [facility] and Me medication assistance program.  Enrollment ends 06/15/2021  Compliance/Adherence/Medication fill history: Care Gaps: ED visit per thousand  Star-Rating Drugs: Xigduo - through patient assistance Losartan 100 mg daily - 09/17/2020 - 90 day supply Simvastatin 40 mg daily - 10/18/2020 - 90 day supply   Patient's preferred pharmacy is:  [facility] [address], [place] - [place] [address] [place] [address] Phone: [phone] Fax: [place], [place] [address] [place] [address] Phone: [phone] Fax: [phone]   Uses pill box? Yes Pt endorses 100% compliance  We discussed: Benefits of medication synchronization, packaging and delivery as well as enhanced pharmacist oversight with [facility]. Patient decided to: Utilize [facility] for medication synchronization, packaging and delivery -Verbal consent obtained for [facility] enhanced pharmacy services (medication synchronization, adherence packaging, delivery coordination). A medication sync plan was created to allow patient to get all medications delivered once every 30 to 90 days per patient preference. Patient understands they have freedom to choose pharmacy and clinical pharmacist will coordinate care between all prescribers and [facility]. Medication Name                        (  please note if Rx is PRN) Prescriber                                                                  (list Provider Name & Phone Number)                                  Timing    Refill Timing Last Fill Date & DS       (if last fill/DS unavailable, list pt.'s quantity on hand) Anticipated next due date    BB B L EM BT   Days Supply   TS/Lancets (Microthin lancets, Relion Premier TS) Cox [phone]       06/12/2021 90.00 09/10/21  Ferrous Sulfate $RemoveBeforeD'325mg'bcCYAKrPUeADjy$  BID Cox  [phone]   2    First packs please First packs please First packs please  Vit D3 1000 IU BID Cox [phone]   1 1   First packs please First packs please First packs please  Simvastatin $RemoveBefo'40mg'gvRpkcGzCuF$   [facility]ox [phone]    1   05/06/2021 90.00 08/09/21  Farxiga $Remove'10mg'albFBuL$  QD Cox [phone]  1     07/12/2021 90.00 10/10/21  Losartan $RemoveB'100mg'QsQHpUsU$  QD Cox [phone]    1   06/12/2021 90.00 09/10/21  Fluoxetine $RemoveBef'20mg'WdXzcRJBPc$  written TID (Put 2 in packs and an extra 3rd in the vial) Cox [phone]  2     06/12/2021 90.00 09/10/21  Pantoprazole $RemoveBefor'40mg'LhWDvJmEpadU$  [facility] [phone]  1     06/08/2021 90.00 09/06/21  Cetirizine $RemoveBef'10mg'Huuwfvoazg$  [facility]ox [phone]  1    Cycle Fill First packs please First packs please First packs please  Ellouise Newer [phone]  2  2   Never taken Never taken 08/09/21    Care Plan and Follow Up Patient Decision:  Patient agrees to Care Plan and Follow-up.  Plan: Telephone follow up appointment with care management team member scheduled for:  August 2023  [place] Constable, [place].D. - [phone]

## 2021-08-06 NOTE — Patient Instructions (Signed)
Visit Information   Goals Addressed   None    Patient Care Plan: CCM Pharmacy Care Plan     Problem Identified: htn, hld, dm   Priority: High  Onset Date: 11/13/2020     Long-Range Goal: Disease State Management   Start Date: 11/13/2020  Expected End Date: 11/13/2021  Recent Progress: On track  Priority: High  Note:   Current Barriers: Unable to independently afford treatment regimen   Pharmacist Clinical Goal(s):  Patient will verbalize ability to afford treatment regimen through collaboration with PharmD and provider.   Interventions: 1:1 collaboration with [name], MD regarding development and update of comprehensive plan of care as evidenced by provider attestation and co-signature Inter-disciplinary care team collaboration (see longitudinal plan of care) Comprehensive medication review performed; medication list updated in electronic medical record   Hypertension (BP goal <130/80) BP Readings from Last 3 Encounters:  07/06/21 120/60  06/26/21 120/60  05/20/21 110/80  -Controlled. Diagnosed >25 years ago.  -Current treatment: Losartan 100 mg daily Appropriate, Effective, Safe, Accessible -Medications previously tried: Losartan-hydrochlorothiazide -Current home readings: well controlled -Current dietary habits: eats lunch out and working to [employer] -Current exercise habits: [place] [employer] -Denies hypotensive/hypertensive symptoms -Educated on BP goals and benefits of medications for prevention of heart attack, stroke and kidney damage; Daily salt intake goal < 2300 mg; Exercise goal of 150 minutes per week; -Counseled to monitor BP at home as needed, document, and provide log at future appointments -Counseled on diet and exercise extensively Recommended to continue current medication   Hyperlipidemia: (LDL goal < 70) The 10-year ASCVD risk score (Arnett DK, et al., 2019) is: 26.9%   Values used to calculate the score:      Age: 76 years     Sex: Female     Is Non-Hispanic African American: No     Diabetic: Yes     Tobacco smoker: No     Systolic Blood Pressure: 759 mmHg     Is BP treated: Yes     HDL Cholesterol: 45 mg/dL     Total Cholesterol: 162 mg/dL Lab Results  Component Value Date   CHOL 162 07/04/2021   CHOL 179 03/29/2021   CHOL 139 12/14/2020   Lab Results  Component Value Date   HDL 45 07/04/2021   HDL 49 03/29/2021   HDL 53 12/14/2020   Lab Results  Component Value Date   LDLCALC 85 07/04/2021   LDLCALC 93 03/29/2021   LDLCALC 65 12/14/2020   Lab Results  Component Value Date   TRIG 191 (H) 07/04/2021   TRIG 217 (H) 03/29/2021   TRIG 116 12/14/2020   Lab Results  Component Value Date   CHOLHDL 3.6 07/04/2021   CHOLHDL 3.7 03/29/2021   CHOLHDL 2.6 12/14/2020  No results found for: LDLDIRECT -Not ideally controlled -Current treatment: Simvastatin 40 mg daily at bedtime Appropriate, Effective, Query Safe, Accessible Vascepa 2 BID Query Appropriate, Query effective, Query Safe, Query accessible -Medications previously tried: none reported -Current dietary patterns: eats out at lunch. Trying to make smarter decisions -Current exercise habits: [place] -Educated on Cholesterol goals; Benefits of statin for ASCVD risk reduction; Importance of limiting foods high in cholesterol; Exercise goal of 150 minutes per week; -Counseled on diet and exercise extensively Feb 2023: Recommend Atorvastatin in case renal function worsens. Patient not taking Vascepa due to fears of it being a "High dose." Used metaphor explaining how  1000 feathers still weighs less than 100 bricks and that you can't determine how "strong" a med is based on the mg   Diabetes (A1c goal <7%) Lab Results  Component Value Date   HGBA1C 6.8 (H) 07/04/2021   HGBA1C 7.3 (H) 03/29/2021   HGBA1C 7.1 (H) 12/14/2020   Lab Results  Component Value Date   MICROALBUR 30 03/29/2021    LDLCALC 85 07/04/2021   CREATININE 1.22 (H) 07/04/2021   Lab Results  Component Value Date   NA 142 07/04/2021   K 4.5 07/04/2021   CREATININE 1.22 (H) 07/04/2021   EGFR 47 (L) 07/04/2021   GFRNONAA 41 (L) 05/04/2020   GLUCOSE 156 (H) 07/04/2021   Lab Results  Component Value Date   WBC 6.8 07/04/2021   HGB 12.9 07/04/2021   HCT 39.5 07/04/2021   MCV 93 07/04/2021   PLT 229 07/04/2021   -Not ideally controlled -Current medications: Farxiga 21m Appropriate, Effective, Safe, Accessible -Medications previously tried: j[place]-Current home glucose readings fasting glucose: ~130 mg/dL post prandial glucose: not checking -Denies hypoglycemic/hyperglycemic symptoms -Current meal patterns: Trying to make smart choices. Eats out with daughter during lunch some and sandwich or simple lunch other days.Cereal for breakfast.  -Current exercise: [place] [employer] [place] unable to currently due to anemia.  -Educated on A1c and blood sugar goals; Complications of diabetes including kidney damage, retinal damage, and cardiovascular disease; Exercise goal of 150 minutes per week; Benefits of weight loss; Benefits of routine self-monitoring of blood sugar; Carbohydrate counting and/or plate method -Counseled to check feet daily and get yearly eye exams -Counseled on diet and exercise extensively Recommended to continue current medication     Patient Goals/Self-Care Activities Patient will: - take medications as prescribed focus on medication adherence by using pill box check glucose daily, document, and provide at future appointments target a minimum of 150 minutes of moderate intensity exercise weekly  Follow Up Plan: Telephone follow up appointment with care management team member scheduled for: ~01/2022  NArizona Constable Pharm.D. -- [phone]     Ms. SGontermanwas given information about Chronic Care Management services today  including:  CCM service includes personalized support from designated clinical staff supervised by her physician, including individualized plan of care and coordination with other care providers 24/7 contact phone numbers for assistance for urgent and routine care needs. Standard insurance, coinsurance, copays and deductibles apply for chronic care management only during months in which we provide at least 20 minutes of these services. Most insurances cover these services at 100%, however patients may be responsible for any copay, coinsurance and/or deductible if applicable. This service may help you avoid the need for more expensive face-to-face services. Only one practitioner may furnish and bill the service in a calendar month. The patient may stop CCM services at any time (effective at the end of the month) by phone call to the office staff.  Patient agreed to services and verbal consent obtained.   The patient verbalized understanding of instructions, educational materials, and care plan provided today and declined offer to receive copy of patient instructions, educational materials, and care plan.  The pharmacy team will reach out to the patient again over the next 90 days.   NLane Hacker RDe [place]

## 2021-08-09 DIAGNOSIS — R2689 Other abnormalities of gait and mobility: Secondary | ICD-10-CM | POA: Diagnosis not present

## 2021-08-09 DIAGNOSIS — Z96651 Presence of right artificial knee joint: Secondary | ICD-10-CM | POA: Diagnosis not present

## 2021-08-09 DIAGNOSIS — M25561 Pain in right knee: Secondary | ICD-10-CM | POA: Diagnosis not present

## 2021-08-13 DIAGNOSIS — I10 Essential (primary) hypertension: Secondary | ICD-10-CM

## 2021-08-13 DIAGNOSIS — E782 Mixed hyperlipidemia: Secondary | ICD-10-CM | POA: Diagnosis not present

## 2021-08-13 DIAGNOSIS — E1121 Type 2 diabetes mellitus with diabetic nephropathy: Secondary | ICD-10-CM | POA: Diagnosis not present

## 2021-08-13 DIAGNOSIS — E1159 Type 2 diabetes mellitus with other circulatory complications: Secondary | ICD-10-CM | POA: Diagnosis not present

## 2021-08-13 DIAGNOSIS — I152 Hypertension secondary to endocrine disorders: Secondary | ICD-10-CM

## 2021-08-14 ENCOUNTER — Other Ambulatory Visit: Payer: Self-pay | Admitting: Family Medicine

## 2021-08-14 DIAGNOSIS — M25561 Pain in right knee: Secondary | ICD-10-CM | POA: Diagnosis not present

## 2021-08-14 DIAGNOSIS — R2689 Other abnormalities of gait and mobility: Secondary | ICD-10-CM | POA: Diagnosis not present

## 2021-08-14 DIAGNOSIS — Z96651 Presence of right artificial knee joint: Secondary | ICD-10-CM | POA: Diagnosis not present

## 2021-08-14 MED ORDER — PANTOPRAZOLE SODIUM 40 MG PO TBEC: 40.0000 mg | DELAYED_RELEASE_TABLET | Freq: Every day | ORAL | 3 refills | Status: DC

## 2021-08-14 MED ORDER — ZYRTEC ALLERGY 10 MG PO CAPS: 10.0000 mg | ORAL_CAPSULE | Freq: Every day | ORAL | 3 refills | Status: DC

## 2021-08-14 MED ORDER — VITAMIN D3 25 MCG (1000 UT) PO CAPS: 1.0000 | ORAL_CAPSULE | Freq: Every day | ORAL | 3 refills | Status: DC

## 2021-08-14 MED ORDER — SIMVASTATIN 40 MG PO TABS: 40.0000 mg | ORAL_TABLET | Freq: Every day | ORAL | 3 refills | Status: DC

## 2021-08-14 MED ORDER — ICOSAPENT ETHYL 1 G PO CAPS: 2.0000 g | ORAL_CAPSULE | Freq: Two times a day (BID) | ORAL | 3 refills | Status: DC

## 2021-08-14 MED ORDER — DAPAGLIFLOZIN PROPANEDIOL 10 MG PO TABS: 10.0000 mg | ORAL_TABLET | Freq: Every day | ORAL | 3 refills | Status: DC

## 2021-08-14 MED ORDER — LOSARTAN POTASSIUM 100 MG PO TABS: 100.0000 mg | ORAL_TABLET | Freq: Every day | ORAL | 3 refills | Status: DC

## 2021-08-14 MED ORDER — FLUOXETINE HCL 20 MG PO CAPS
60.0000 mg | ORAL_CAPSULE | Freq: Every day | ORAL | 3 refills | Status: DC
Start: 2021-08-14 — End: 2022-01-14

## 2021-08-14 NOTE — Telephone Encounter (Signed)
I called pt today to discuss but had to lvm to call me back if they still had questions about cost of medications  ? ?Elray Mcgregor, CMA ?Clinical Pharmacist Assistant  ?[phone]  ?

## 2021-08-15 ENCOUNTER — Telehealth: Payer: Self-pay

## 2021-08-15 NOTE — Chronic Care Management (AMB) (Signed)
? ? ?  Chronic Care Management ?Pharmacy Assistant  ? ?Name: KENIDY CROSSLAND  MRN: [id number] DOB: [birth year] ? ?Reason for Encounter: Prior Authorization ?  ?08/15/2021- Prior authorization created in CovermyMeds for Vascepa. Patient approved. PA Case ID: [id number] - Rx #: [id number] Approved today 02-MAR-23:01-MAR-24 Icosapent Ethyl 1GM OR CAPS Quantity:224. [facility] notified. ? ? ?Medications: ?Outpatient Encounter Medications as of 08/15/2021  ?Medication Sig  ? Acetaminophen (TYLENOL PO) Take by mouth as needed.  ? Cetirizine HCl (ZYRTEC ALLERGY) 10 MG CAPS Take 1 capsule (10 mg total) by mouth daily.  ? Cholecalciferol (VITAMIN D3) 25 MCG (1000 UT) CAPS Take 1 capsule (1,000 Units total) by mouth daily.  ? dapagliflozin propanediol (FARXIGA) 10 MG TABS tablet Take 1 tablet (10 mg total) by mouth daily before breakfast.  ? Ferrous Sulfate (IRON PO) Take 2 tablets by mouth daily in the afternoon.  ? FLUoxetine (PROZAC) 20 MG capsule Take 3 capsules (60 mg total) by mouth daily.  ? icosapent Ethyl (VASCEPA) 1 g capsule Take 2 capsules (2 g total) by mouth 2 (two) times daily.  ? losartan (COZAAR) 100 MG tablet Take 1 tablet (100 mg total) by mouth daily.  ? methocarbamol (ROBAXIN) 500 MG tablet Take 500-1,000 mg by mouth 4 (four) times daily.  ? pantoprazole (PROTONIX) 40 MG tablet Take 1 tablet (40 mg total) by mouth daily.  ? simvastatin (ZOCOR) 40 MG tablet Take 1 tablet (40 mg total) by mouth at bedtime.  ? ?No facility-administered encounter medications on file as of 08/15/2021.  ? ?Pattricia Boss, CMA ?Clinical Pharmacist Assistant ?[phone] ? ?

## 2021-08-19 NOTE — Telephone Encounter (Signed)
I got in touch with the pt and she stated she is not taking the Januvia and she was asking the status of her Farxiga '10mg'$ . I read through the notes and it looks like [facility] faxed [place] the prescription in December 2022. I do not see her PAP application in our folder so I gave the pt the number to the company to follow up and she will be calling me back after finding out the status of her shipment.  ? ?Elray Mcgregor, CMA ?Clinical Pharmacist Assistant  ?[phone]  ?

## 2021-08-20 NOTE — Telephone Encounter (Signed)
Patient states she is no longer taking Januvia but does have [place] samples still. Asked her to call when she runs out of samples. She was out shopping and didn't have time to ask if she called the drug company ?

## 2021-08-21 DIAGNOSIS — Z96651 Presence of right artificial knee joint: Secondary | ICD-10-CM | POA: Diagnosis not present

## 2021-08-21 DIAGNOSIS — M25561 Pain in right knee: Secondary | ICD-10-CM | POA: Diagnosis not present

## 2021-08-21 DIAGNOSIS — R2689 Other abnormalities of gait and mobility: Secondary | ICD-10-CM | POA: Diagnosis not present

## 2021-08-22 ENCOUNTER — Telehealth: Payer: Self-pay

## 2021-08-22 NOTE — Chronic Care Management (AMB) (Unsigned)
[place]

## 2021-08-27 ENCOUNTER — Other Ambulatory Visit: Payer: Self-pay | Admitting: Family Medicine

## 2021-08-27 NOTE — Telephone Encounter (Signed)
Refill sent to pharmacy.   

## 2021-08-28 ENCOUNTER — Other Ambulatory Visit: Payer: Self-pay

## 2021-08-28 DIAGNOSIS — M25561 Pain in right knee: Secondary | ICD-10-CM | POA: Diagnosis not present

## 2021-08-28 DIAGNOSIS — Z96651 Presence of right artificial knee joint: Secondary | ICD-10-CM | POA: Diagnosis not present

## 2021-08-28 DIAGNOSIS — R2689 Other abnormalities of gait and mobility: Secondary | ICD-10-CM | POA: Diagnosis not present

## 2021-08-28 MED ORDER — DAPAGLIFLOZIN PROPANEDIOL 10 MG PO TABS
10.0000 mg | ORAL_TABLET | Freq: Every day | ORAL | 3 refills | Status: DC
Start: 2021-08-28 — End: 2022-08-01

## 2021-08-29 ENCOUNTER — Other Ambulatory Visit: Payer: Self-pay

## 2021-08-29 MED ORDER — PX LANCETS MICROTHIN 33G MISC: 1.0000 | Freq: Two times a day (BID) | 2 refills | Status: DC

## 2021-08-29 MED ORDER — GLUCOSE BLOOD VI STRP: 1.0000 | ORAL_STRIP | Freq: Two times a day (BID) | 2 refills | Status: DC

## 2021-09-02 ENCOUNTER — Other Ambulatory Visit: Payer: Self-pay

## 2021-09-02 DIAGNOSIS — E1121 Type 2 diabetes mellitus with diabetic nephropathy: Secondary | ICD-10-CM

## 2021-09-02 MED ORDER — GLUCOSE BLOOD VI STRP: 1.0000 | ORAL_STRIP | Freq: Two times a day (BID) | 2 refills | Status: AC

## 2021-09-02 MED ORDER — PX LANCETS MICROTHIN 33G MISC: 1.0000 | Freq: Two times a day (BID) | 2 refills | Status: DC

## 2021-09-03 DIAGNOSIS — M25561 Pain in right knee: Secondary | ICD-10-CM | POA: Diagnosis not present

## 2021-09-03 DIAGNOSIS — Z96651 Presence of right artificial knee joint: Secondary | ICD-10-CM | POA: Diagnosis not present

## 2021-09-03 DIAGNOSIS — R2689 Other abnormalities of gait and mobility: Secondary | ICD-10-CM | POA: Diagnosis not present

## 2021-10-03 ENCOUNTER — Other Ambulatory Visit: Payer: Self-pay | Admitting: Family Medicine

## 2021-10-03 ENCOUNTER — Ambulatory Visit (INDEPENDENT_AMBULATORY_CARE_PROVIDER_SITE_OTHER): Payer: PPO | Admitting: Family Medicine

## 2021-10-03 VITALS — BP 116/60 | HR 72 | Temp 97.1°F | Resp 14 | Ht 61.0 in | Wt 144.2 lb

## 2021-10-03 DIAGNOSIS — E782 Mixed hyperlipidemia: Secondary | ICD-10-CM

## 2021-10-03 DIAGNOSIS — I152 Hypertension secondary to endocrine disorders: Secondary | ICD-10-CM

## 2021-10-03 DIAGNOSIS — I129 Hypertensive chronic kidney disease with stage 1 through stage 4 chronic kidney disease, or unspecified chronic kidney disease: Secondary | ICD-10-CM

## 2021-10-03 DIAGNOSIS — E1121 Type 2 diabetes mellitus with diabetic nephropathy: Secondary | ICD-10-CM

## 2021-10-03 DIAGNOSIS — E1169 Type 2 diabetes mellitus with other specified complication: Secondary | ICD-10-CM | POA: Diagnosis not present

## 2021-10-03 DIAGNOSIS — F330 Major depressive disorder, recurrent, mild: Secondary | ICD-10-CM

## 2021-10-03 DIAGNOSIS — E1159 Type 2 diabetes mellitus with other circulatory complications: Secondary | ICD-10-CM | POA: Diagnosis not present

## 2021-10-03 DIAGNOSIS — N1832 Chronic kidney disease, stage 3b: Secondary | ICD-10-CM | POA: Diagnosis not present

## 2021-10-03 DIAGNOSIS — I10 Essential (primary) hypertension: Secondary | ICD-10-CM | POA: Diagnosis not present

## 2021-10-03 NOTE — Progress Notes (Signed)
? ?Subjective:  ?Patient ID: Alicia Jordan, female    DOB: 11/15/1945  Age: 76 y.o. MRN: [id number] ? ?Chief Complaint  ?Patient presents with  ? Diabetes  ? Hyperlipidemia  ? Hypertension  ? ? ?HPI ?Diabetes:  sugars check once in am. Sugars 120s. Checking feet daily. No polydipsia, no polyphagia, no polyuria. Taking farxiga 10 mg daily.  ? ?Hyperlipidemia:  Taking simvastatin 40 mg daily, Vascepa 2 grams twice daily. ? ?Hypertension:  Losartan 100 mg daily.   ? ?Depression/Anxiety: on prozac 20 g 2 daily.  ? ?Current Outpatient Medications on File Prior to Visit  ?Medication Sig Dispense Refill  ? Acetaminophen (TYLENOL PO) Take by mouth as needed.    ? Cetirizine HCl (ZYRTEC ALLERGY) 10 MG CAPS Take 1 capsule (10 mg total) by mouth daily. 90 capsule 3  ? Cholecalciferol (VITAMIN D3) 25 MCG (1000 UT) CAPS Take 1 capsule (1,000 Units total) by mouth daily. 180 capsule 3  ? dapagliflozin propanediol (FARXIGA) 10 MG TABS tablet Take 1 tablet (10 mg total) by mouth daily before breakfast. 90 tablet 3  ? Ferrous Sulfate (IRON PO) Take 2 tablets by mouth daily in the afternoon.    ? FLUoxetine (PROZAC) 20 MG capsule Take 3 capsules (60 mg total) by mouth daily. (Patient taking differently: Take 40 mg by mouth daily.) 270 capsule 3  ? glucose blood test strip 1 each by Other route 2 (two) times daily. Use as instructed 100 each 2  ? icosapent Ethyl (VASCEPA) 1 g capsule Take 2 capsules (2 g total) by mouth 2 (two) times daily. 360 capsule 3  ? losartan (COZAAR) 100 MG tablet Take 1 tablet by mouth once daily 90 tablet 0  ? pantoprazole (PROTONIX) 40 MG tablet Take 1 tablet (40 mg total) by mouth daily. 90 tablet 3  ? PX Lancets MicroThin 33G MISC 1 each by Does not apply route 2 (two) times daily. 100 each 2  ? ?No current facility-administered medications on file prior to visit.  ? ?Past Medical History:  ?Diagnosis Date  ? Allergy   ? Anemia   ? Anxiety   ? Arthralgia of left temporomandibular joint   ? Arthritis    ? Cervicalgia   ? Chronic kidney disease, stage 3a ([place])   ? Depression   ? Diabetes mellitus without complication ([place])   ? Hesitancy of micturition   ? Hyperlipidemia   ? Hypertension   ? Osteoporosis   ? Other psoriasis   ? Other specified menopausal and perimenopausal disorders   ? Overweight   ? Repeated falls   ? Residual hemorrhoidal skin tags   ? ?Past Surgical History:  ?Procedure Laterality Date  ? ABDOMINAL HYSTERECTOMY    ? APPENDECTOMY    ? CARPAL TUNNEL RELEASE Right   ? CHOLECYSTECTOMY    ? COLONOSCOPY  04/09/2016  ? Mild sigmoid diverticulosis. Otherwise normal colonoscopy  ? ESOPHAGOGASTRODUODENOSCOPY  09/10/2012  ? No obvious esophageal stricture, status post empiric esophageal dilation. Irregular GE junction, questionable significance. Mild gastritis.  ? eye lid surgery    ? KNEE SURGERY Left   ? REPLACEMENT TOTAL KNEE Right 06/03/2021  ?  ?Family History  ?Problem Relation Age of Onset  ? Diabetes type II Mother   ? Hyperlipidemia Mother   ? Hypertension Mother   ? Heart attack Father   ? Hyperlipidemia Father   ? Hypertension Father   ? Stroke Father   ? Diabetes type II Father   ? Colon cancer Daughter 80  ?  Cancer Daughter   ? Esophageal cancer Neg Hx   ? Rectal cancer Neg Hx   ? Stomach cancer Neg Hx   ? ?Social History  ? ?Socioeconomic History  ? Marital status: Married  ?  Spouse name: Joneen Boers  ? Number of children: 4  ? Years of education: Not on file  ? Highest education level: Not on file  ?Occupational History  ? Occupation: Homemaker  ?Tobacco Use  ? Smoking status: Never  ? Smokeless tobacco: Never  ?Vaping Use  ? Vaping Use: Never used  ?Substance and Sexual Activity  ? Alcohol use: Never  ? Drug use: Never  ? Sexual activity: Not on file  ?Other Topics Concern  ? Not on file  ?Social History Narrative  ? Kately spends a lot if time helping her daughter get to and from oncology appointments  ? Her husband has started drinking alcohol again  ? ?Social Determinants of Health   ? ?Financial Resource Strain: Low Risk   ? Difficulty of Paying Living Expenses: Not hard at all  ?Food Insecurity: Not on file  ?Transportation Needs: No Transportation Needs  ? Lack of Transportation (Medical): No  ? Lack of Transportation (Non-Medical): No  ?Physical Activity: Not on file  ?Stress: Not on file  ?Social Connections: Not on file  ? ? ?Review of Systems  ?Constitutional:  Negative for chills, fatigue and fever.  ?HENT:  Negative for congestion, rhinorrhea and sore throat.   ?Respiratory:  Negative for cough and shortness of breath.   ?Cardiovascular:  Negative for chest pain.  ?Gastrointestinal:  Negative for abdominal pain, constipation, diarrhea, nausea and vomiting.  ?Genitourinary:  Negative for dysuria and urgency.  ?Musculoskeletal:  Negative for back pain and myalgias.  ?Neurological:  Negative for dizziness, weakness, light-headedness and headaches.  ?Psychiatric/Behavioral:  Negative for dysphoric mood. The patient is not nervous/anxious.   ? ? ?Objective:  ?BP 116/60   Pulse 72   Temp (!) 97.1 ?F (36.2 ?C)   Resp 14   Ht '5\' 1"'$  (1.549 m)   Wt 144 lb 3.2 oz (65.4 kg)   BMI 27.25 kg/m?  ? ? ?  10/03/2021  ?  8:19 AM 07/06/2021  ? 12:40 PM 06/26/2021  ?  2:37 PM  ?BP/Weight  ?Systolic BP 025 852 778  ?Diastolic BP 60 60 60  ?Wt. (Lbs) 144.2 138 142  ?BMI 27.25 kg/m2 25.72 kg/m2 26.47 kg/m2  ? ? ?Physical Exam ?Vitals reviewed.  ?Constitutional:   ?   Appearance: Normal appearance. She is normal weight.  ?Neck:  ?   Vascular: No carotid bruit.  ?Cardiovascular:  ?   Rate and Rhythm: Normal rate and regular rhythm.  ?   Heart sounds: Normal heart sounds.  ?Pulmonary:  ?   Effort: Pulmonary effort is normal. No respiratory distress.  ?   Breath sounds: Normal breath sounds.  ?Abdominal:  ?   General: Abdomen is flat. Bowel sounds are normal.  ?   Palpations: Abdomen is soft.  ?   Tenderness: There is no abdominal tenderness.  ?Neurological:  ?   Mental Status: She is alert and oriented to  person, place, and time.  ?Psychiatric:     ?   Mood and Affect: Mood normal.     ?   Behavior: Behavior normal.  ? ? ?Diabetic Foot Exam - Simple   ?Simple Foot Form ?Diabetic Foot exam was performed with the following findings: Yes 10/03/2021  8:52 AM  ?Visual Inspection ?No deformities, no ulcerations,  no other skin breakdown bilaterally: Yes ?Sensation Testing ?Intact to touch and monofilament testing bilaterally: Yes ?Pulse Check ?Posterior Tibialis and Dorsalis pulse intact bilaterally: Yes ?Comments ?  ?  ? ?Lab Results  ?Component Value Date  ? WBC 5.3 10/03/2021  ? HGB 14.2 10/03/2021  ? HCT 42.1 10/03/2021  ? PLT 153 10/03/2021  ? GLUCOSE 154 (H) 10/03/2021  ? CHOL 171 10/03/2021  ? TRIG 102 10/03/2021  ? HDL 50 10/03/2021  ? LDLCALC 102 (H) 10/03/2021  ? ALT 12 10/03/2021  ? AST 15 10/03/2021  ? NA 145 (H) 10/03/2021  ? K 4.5 10/03/2021  ? CL 106 10/03/2021  ? CREATININE 1.40 (H) 10/03/2021  ? BUN 28 (H) 10/03/2021  ? CO2 24 10/03/2021  ? TSH 1.890 07/04/2021  ? INR 1.0 12/14/2020  ? HGBA1C 7.7 (H) 10/03/2021  ? MICROALBUR 30 03/29/2021  ? ? ? ? ?Assessment & Plan:  ? ?Problem List Items Addressed This Visit   ? ?  ? Cardiovascular and Mediastinum  ? Hypertension associated with diabetes ([place]) (Chronic)  ?  Well controlled.  ?No changes to medicines.  ?Continue to work on eating a healthy diet and exercise.  ?Labs drawn today.  ? ?  ?  ? Relevant Orders  ? CBC with Differential/Platelet  ? Comprehensive metabolic panel  ?  ? Endocrine  ? Diabetic glomerulopathy ([place]) - Primary  ?  Control: good ?Recommend check sugars fasting daily. ?Recommend check feet daily. ?Recommend annual eye exams. ?Medicines: farxiga 10 mg daily. Start rybelsus 3 mg daily. Then increase to 7 mg daily.  ?Continue to work on eating a healthy diet and exercise.  ?Labs drawn today.   ? ?  ?  ? Relevant Orders  ? Hemoglobin A1c  ? Microalbumin / creatinine urine ratio  ?  ? Genitourinary  ? Hypertensive kidney disease with stage 3b  chronic kidney disease ([place])  ?  Stable.  ? ?  ?  ?  ? Other  ? Mixed hyperlipidemia  ?  Not at goal. Change zocor to crestor.  ?Continue to work on eating a healthy diet and exercise.  ?Labs drawn today.  ? ?  ?  ? [place]

## 2021-10-04 LAB — CBC WITH DIFFERENTIAL/PLATELET
Basophils Absolute: 0 10*3/uL (ref 0.0–0.2)
Basos: 1 %
EOS (ABSOLUTE): 0.2 10*3/uL (ref 0.0–0.4)
Eos: 5 %
Hematocrit: 42.1 % (ref 34.0–46.6)
Hemoglobin: 14.2 g/dL (ref 11.1–15.9)
Immature Grans (Abs): 0 10*3/uL (ref 0.0–0.1)
Immature Granulocytes: 0 %
Lymphocytes Absolute: 1.4 10*3/uL (ref 0.7–3.1)
Lymphs: 26 %
MCH: 31.1 pg (ref 26.6–33.0)
MCHC: 33.7 g/dL (ref 31.5–35.7)
MCV: 92 fL (ref 79–97)
Monocytes Absolute: 0.3 10*3/uL (ref 0.1–0.9)
Monocytes: 6 %
Neutrophils Absolute: 3.4 10*3/uL (ref 1.4–7.0)
Neutrophils: 62 %
Platelets: 153 10*3/uL (ref 150–450)
RBC: 4.56 x10E6/uL (ref 3.77–5.28)
RDW: 13.4 % (ref 11.7–15.4)
WBC: 5.3 10*3/uL (ref 3.4–10.8)

## 2021-10-04 LAB — COMPREHENSIVE METABOLIC PANEL
ALT: 12 IU/L (ref 0–32)
AST: 15 IU/L (ref 0–40)
Albumin/Globulin Ratio: 2 (ref 1.2–2.2)
Albumin: 4.7 g/dL (ref 3.7–4.7)
Alkaline Phosphatase: 68 IU/L (ref 44–121)
BUN/Creatinine Ratio: 20 (ref 12–28)
BUN: 28 mg/dL — ABNORMAL HIGH (ref 8–27)
Bilirubin Total: 0.9 mg/dL (ref 0.0–1.2)
CO2: 24 mmol/L (ref 20–29)
Calcium: 9.6 mg/dL (ref 8.7–10.3)
Chloride: 106 mmol/L (ref 96–106)
Creatinine, Ser: 1.4 mg/dL — ABNORMAL HIGH (ref 0.57–1.00)
Globulin, Total: 2.4 g/dL (ref 1.5–4.5)
Glucose: 154 mg/dL — ABNORMAL HIGH (ref 70–99)
Potassium: 4.5 mmol/L (ref 3.5–5.2)
Sodium: 145 mmol/L — ABNORMAL HIGH (ref 134–144)
Total Protein: 7.1 g/dL (ref 6.0–8.5)
eGFR: 40 mL/min/{1.73_m2} — ABNORMAL LOW (ref >59)

## 2021-10-04 LAB — CARDIOVASCULAR RISK ASSESSMENT

## 2021-10-04 LAB — MICROALBUMIN / CREATININE URINE RATIO
Creatinine, Urine: 94.9 mg/dL
Microalb/Creat Ratio: <3 mg/g creat (ref 0–29)
Microalbumin, Urine: <3 ug/mL

## 2021-10-04 LAB — LIPID PANEL W/O CHOL/HDL RATIO
Cholesterol, Total: 171 mg/dL (ref 100–199)
HDL: 50 mg/dL (ref >39)
LDL Chol Calc (NIH): 102 mg/dL — ABNORMAL HIGH (ref 0–99)
Triglycerides: 102 mg/dL (ref 0–149)
VLDL Cholesterol Cal: 19 mg/dL (ref 5–40)

## 2021-10-04 LAB — HGB A1C W/O EAG: Hgb A1c MFr Bld: 7.7 % — ABNORMAL HIGH (ref 4.8–5.6)

## 2021-10-07 NOTE — Progress Notes (Signed)
Blood count normal.  ?Liver function normal.  ?Kidney function abnormal, but stable. ?Thyroid function normal.  ?Cholesterol: LDL a little worse at 102.  Was 52.  Her triglycerides are excellent.  Dropped from 191-102.  Recommend change simvastatin to Crestor 20 mg daily ?HBA1C: 7.7.  Increased from 6.8.  Not at goal.  Continue farxiga 10 mg daily.  Recommend Rybelsus 3 mg daily x1 month.  Then recommend increase to 7 mg daily until seen again.  Recommend follow-up in 3 months. ?Patient is not spilling any protein in her urine.

## 2021-10-08 ENCOUNTER — Other Ambulatory Visit: Payer: Self-pay

## 2021-10-08 MED ORDER — ROSUVASTATIN CALCIUM 20 MG PO TABS: 20.0000 mg | ORAL_TABLET | Freq: Every day | ORAL | 2 refills | Status: DC

## 2021-10-10 ENCOUNTER — Telehealth: Payer: Self-pay

## 2021-10-10 NOTE — Progress Notes (Signed)
? ? ?  Chronic Care Management ?Pharmacy Assistant  ? ?Name: Alicia Jordan  MRN: [id number] DOB: 1945/07/20 ? ? ?Reason for Encounter: Medication Coordination for [facility]  ?  ?Recent office visits:  ?10/03/21 [name] MD. Seen for routine visit. D/C Methocarbamol 500-'1000mg'$ .  ? ?10/03/21 [name] MD. Telephone Encounter. Recommend increase to 7 mg daily until seen again ? ?Recent consult visits:  ?09/03/21 Donia Ast PA-C. Seen for post op exam. No med changes.  ? ?Hospital visits:  ?None ? ?Medications: ?Outpatient Encounter Medications as of 10/10/2021  ?Medication Sig  ? Acetaminophen (TYLENOL PO) Take by mouth as needed.  ? Cetirizine HCl (ZYRTEC ALLERGY) 10 MG CAPS Take 1 capsule (10 mg total) by mouth daily.  ? Cholecalciferol (VITAMIN D3) 25 MCG (1000 UT) CAPS Take 1 capsule (1,000 Units total) by mouth daily.  ? dapagliflozin propanediol (FARXIGA) 10 MG TABS tablet Take 1 tablet (10 mg total) by mouth daily before breakfast.  ? Ferrous Sulfate (IRON PO) Take 2 tablets by mouth daily in the afternoon.  ? FLUoxetine (PROZAC) 20 MG capsule Take 3 capsules (60 mg total) by mouth daily. (Patient taking differently: Take 40 mg by mouth daily.)  ? glucose blood test strip 1 each by Other route 2 (two) times daily. Use as instructed  ? icosapent Ethyl (VASCEPA) 1 g capsule Take 2 capsules (2 g total) by mouth 2 (two) times daily.  ? losartan (COZAAR) 100 MG tablet Take 1 tablet by mouth once daily  ? pantoprazole (PROTONIX) 40 MG tablet Take 1 tablet (40 mg total) by mouth daily.  ? PX Lancets MicroThin 33G MISC 1 each by Does not apply route 2 (two) times daily.  ? rosuvastatin (CRESTOR) 20 MG tablet Take 1 tablet (20 mg total) by mouth daily.  ? ?No facility-administered encounter medications on file as of 10/10/2021.  ? ? ?Reviewed chart for medication changes ahead of medication coordination call. ? ?No hospital visits since last care coordination call/Pharmacist visit. ? ?No medication changes  indicated OR if recent visit, treatment plan here. ? ?BP Readings from Last 3 Encounters:  ?10/03/21 116/60  ?07/06/21 120/60  ?06/26/21 120/60  ?  ?Lab Results  ?Component Value Date  ? HGBA1C 7.7 (H) 10/03/2021  ?  ? ?Patient obtains medications through Adherence Packaging  90 Days  ? ?Patient is due for her first adherence delivery on: 10/21/21. ?Called patient and reviewed medications and coordinated delivery. ? ?This delivery to include: ?Ferrous Sulfate '325mg'$  2 at L ?Vitamin D3 95mg 1 at B ?Losartan '100mg'$  1 at EM ?Fluoxetine '20mg'$  2 at B ?Pantoprazole '40mg'$  1 at B ?Cetirizine '10mg'$  1 at B ?Vascepa 1gm 2 at B and 2 at EM ? ?Patient declined the following medications  ?Farxiga Gets PAP ?Simvastatin '40mg'$  - Change in therapy  ?Test Strips/Lancets- Pt has plenty of supply, does not need ?[facility] '20[facility]'$ [facility] up already. We will need to transfer ? ?Patient needs refills  ?None ? ?Confirmed delivery date of 10/31/21, advised patient that pharmacy will contact them the morning of delivery. ? ?DElray Mcgregor CMA ?Clinical Pharmacist Assistant  ?[phone] ?

## 2021-10-13 ENCOUNTER — Encounter: Payer: Self-pay | Admitting: Family Medicine

## 2021-10-13 NOTE — Assessment & Plan Note (Signed)
Well controlled.  ?No changes to medicines.  ?Continue to work on eating a healthy diet and exercise.  ?Labs drawn today.  ?

## 2021-10-13 NOTE — Assessment & Plan Note (Signed)
Stable

## 2021-10-13 NOTE — Assessment & Plan Note (Signed)
Control: good ?Recommend check sugars fasting daily. ?Recommend check feet daily. ?Recommend annual eye exams. ?Medicines: farxiga 10 mg daily. Start rybelsus 3 mg daily. Then increase to 7 mg daily.  ?Continue to work on eating a healthy diet and exercise.  ?Labs drawn today.   ? ?

## 2021-10-13 NOTE — Assessment & Plan Note (Addendum)
Not at goal. Change zocor to crestor.  ?Continue to work on eating a healthy diet and exercise.  ?Labs drawn today.  ? ?

## 2021-10-13 NOTE — Assessment & Plan Note (Addendum)
Continue prozac 20 mg 2 daily.  ?

## 2021-10-14 ENCOUNTER — Telehealth: Payer: Self-pay

## 2021-10-14 NOTE — Telephone Encounter (Signed)
Called to make an AWV appointment for patient, husband stated she was busy at the moment and would call back. ?

## 2021-10-14 NOTE — Telephone Encounter (Signed)
Compliant on meds 

## 2021-10-15 ENCOUNTER — Telehealth: Payer: Self-pay

## 2021-10-15 ENCOUNTER — Other Ambulatory Visit: Payer: Self-pay

## 2021-10-15 MED ORDER — ROSUVASTATIN CALCIUM 20 MG PO TABS
20.0000 mg | ORAL_TABLET | Freq: Every day | ORAL | 1 refills | Status: DC
Start: 2021-10-15 — End: 2022-03-27

## 2021-10-15 MED ORDER — LOSARTAN POTASSIUM 100 MG PO TABS: 100.0000 mg | ORAL_TABLET | Freq: Every day | ORAL | 1 refills | Status: DC

## 2021-10-15 NOTE — Telephone Encounter (Signed)
Will get new scripts per [facility] ? ? ?

## 2021-11-20 DIAGNOSIS — M5442 Lumbago with sciatica, left side: Secondary | ICD-10-CM | POA: Diagnosis not present

## 2021-11-20 DIAGNOSIS — M9903 Segmental and somatic dysfunction of lumbar region: Secondary | ICD-10-CM | POA: Diagnosis not present

## 2021-11-20 DIAGNOSIS — M5136 Other intervertebral disc degeneration, lumbar region: Secondary | ICD-10-CM | POA: Diagnosis not present

## 2021-11-20 DIAGNOSIS — M9904 Segmental and somatic dysfunction of sacral region: Secondary | ICD-10-CM | POA: Diagnosis not present

## 2021-12-05 DIAGNOSIS — Z96651 Presence of right artificial knee joint: Secondary | ICD-10-CM | POA: Diagnosis not present

## 2021-12-25 DIAGNOSIS — M9903 Segmental and somatic dysfunction of lumbar region: Secondary | ICD-10-CM | POA: Diagnosis not present

## 2021-12-25 DIAGNOSIS — M5136 Other intervertebral disc degeneration, lumbar region: Secondary | ICD-10-CM | POA: Diagnosis not present

## 2021-12-25 DIAGNOSIS — M5442 Lumbago with sciatica, left side: Secondary | ICD-10-CM | POA: Diagnosis not present

## 2021-12-25 DIAGNOSIS — M9904 Segmental and somatic dysfunction of sacral region: Secondary | ICD-10-CM | POA: Diagnosis not present

## 2022-01-07 ENCOUNTER — Telehealth: Payer: Self-pay

## 2022-01-07 NOTE — Progress Notes (Signed)
    Chronic Care Management Pharmacy Assistant   Name: Alicia Jordan  MRN: [id number] DOB: 05/27/1946   Reason for Encounter: Medication Coordination for [facility]    Recent office visits:  None  Recent consult visits:  12/05/21 Donia Ast PA-C. Seen for post op. Noted D/C Simvastatin '40mg'$ .   Hospital visits:  None  Medications: Outpatient Encounter Medications as of 01/07/2022  Medication Sig   Acetaminophen (TYLENOL PO) Take by mouth as needed.   Cetirizine HCl (ZYRTEC ALLERGY) 10 MG CAPS Take 1 capsule (10 mg total) by mouth daily.   Cholecalciferol (VITAMIN D3) 25 MCG (1000 UT) CAPS Take 1 capsule (1,000 Units total) by mouth daily.   dapagliflozin propanediol (FARXIGA) 10 MG TABS tablet Take 1 tablet (10 mg total) by mouth daily before breakfast.   Ferrous Sulfate (IRON PO) Take 2 tablets by mouth daily in the afternoon.   FLUoxetine (PROZAC) 20 MG capsule Take 3 capsules (60 mg total) by mouth daily. (Patient taking differently: Take 40 mg by mouth daily.)   glucose blood test strip 1 each by Other route 2 (two) times daily. Use as instructed   icosapent Ethyl (VASCEPA) 1 g capsule Take 2 capsules (2 g total) by mouth 2 (two) times daily.   losartan (COZAAR) 100 MG tablet Take 1 tablet (100 mg total) by mouth daily.   pantoprazole (PROTONIX) 40 MG tablet Take 1 tablet (40 mg total) by mouth daily.   PX Lancets MicroThin 33G MISC 1 each by Does not apply route 2 (two) times daily.   rosuvastatin (CRESTOR) 20 MG tablet Take 1 tablet (20 mg total) by mouth daily.   No facility-administered encounter medications on file as of 01/07/2022.    Reviewed chart for medication changes ahead of medication coordination call.  No OVs, or hospital visits since last care coordination call/Pharmacist visit.   No medication changes indicated OR if recent visit, treatment plan here.  BP Readings from Last 3 Encounters:  10/03/21 116/60  07/06/21 120/60  06/26/21 120/60     Lab Results  Component Value Date   HGBA1C 7.7 (H) 10/03/2021     Patient obtains medications through Adherence Packaging  90 Days   Last adherence delivery included: Ferrous Sulfate '325mg'$  2 at L Vitamin D3 70mg 1 at B Losartan '100mg'$  1 at EM Fluoxetine '20mg'$  2 at B Pantoprazole '40mg'$  1 at B Cetirizine '10mg'$  1 at B Vascepa 1gm 2 at B and 2 at EM  [facility]meds[facility]  - Change in therapy  Test Strips/Lancets- Pt has plenty of supply, does not need Rosuvastatin '20mg'$ - [facility] Pt picked up already. We will need to transfer  Patient is due for next adherence delivery on: 01/17/22. Called patient and reviewed medications and coordinated delivery.  This delivery to include: Ferrous Sulfate '325mg'$  2 at L Vitamin D3 283m 1 at B Rosuvastatin '20mg'$  1 at B Losartan '100mg'$  1 at EM Fluoxetine '20mg'$  2 at B Pantoprazole '40mg'$  1 at B Cetirizine '10mg'$  1 at B Vascepa 1gm 2 at B and 2 at EM  Patient declined the following medications  Farxiga Gets PAP Test Strips/Lancets- Pt has plenty of supply, does not need  Patient needs refills  None  Confirmed delivery date of 01/17/22, advised patient that pharmacy will contact them the morning of delivery.   DaElray McgregorCMScott Cityharmacist Assistant  [phone]

## 2022-01-09 ENCOUNTER — Ambulatory Visit: Payer: PPO | Admitting: Family Medicine

## 2022-01-13 NOTE — Progress Notes (Unsigned)
Subjective:  Patient ID: Alicia Jordan, female    DOB: 1946-02-27  Age: 76 y.o. MRN: [id number]  Chief Complaint  Patient presents with   Diabetes   Hyperlipidemia   Hypertension   HPI:  Diabetes:   Checks sugars 2-3 times per week Log: 160s. Checking feet daily.  Eye exam scheduled with [facility] in September 2023.  No polydipsia, no polyphagia, no polyuria.  Medications: Farxiga 10 mg daily. Rybelsus was too expensive. She was unable to get it.   Hyperlipidemia:  Taking Rosuvastatin 20 mg daily, Vascepa 2 grams twice daily.  Hypertension:  Losartan 100 mg daily.    Depression/Anxiety: on prozac 20 g 2 daily.     01/14/2022    7:48 AM 07/06/2021   12:41 PM 03/29/2021    8:06 AM  PHQ9 SCORE ONLY  PHQ-9 Total Score '1 3 3    '$ GERD: on protonix 40 mg once daily. Has to take mylanta 1-2 times every 3 weeks.   Allergic rhinitis. ON zyrtec. Has tried claritin, allegra, and benadryl. Has not tried singulair.    Current Outpatient Medications on File Prior to Visit  Medication Sig Dispense Refill   Acetaminophen (TYLENOL PO) Take by mouth as needed.     Cetirizine HCl (ZYRTEC ALLERGY) 10 MG CAPS Take 1 capsule (10 mg total) by mouth daily. 90 capsule 3   Cholecalciferol (VITAMIN D3) 25 MCG (1000 UT) CAPS Take 1 capsule (1,000 Units total) by mouth daily. 180 capsule 3   dapagliflozin propanediol (FARXIGA) 10 MG TABS tablet Take 1 tablet (10 mg total) by mouth daily before breakfast. 90 tablet 3   Ferrous Sulfate (IRON PO) Take 2 tablets by mouth daily in the afternoon.     glucose blood test strip 1 each by Other route 2 (two) times daily. Use as instructed 100 each 2   icosapent Ethyl (VASCEPA) 1 g capsule Take 2 capsules (2 g total) by mouth 2 (two) times daily. 360 capsule 3   losartan (COZAAR) 100 MG tablet Take 1 tablet (100 mg total) by mouth daily. 90 tablet 1   pantoprazole (PROTONIX) 40 MG tablet Take 1 tablet (40 mg total) by mouth daily. 90 tablet 3   PX Lancets  MicroThin 33G MISC 1 each by Does not apply route 2 (two) times daily. 100 each 2   rosuvastatin (CRESTOR) 20 MG tablet Take 1 tablet (20 mg total) by mouth daily. 90 tablet 1   No current facility-administered medications on file prior to visit.   Past Medical History:  Diagnosis Date   Allergy    Anemia    Anxiety    Arthralgia of left temporomandibular joint    Arthritis    Cervicalgia    Chronic kidney disease, stage 3a (HCC)    Depression    Diabetes mellitus without complication ([place])    Hesitancy of micturition    Hyperlipidemia    Hypertension    Osteoporosis    Other psoriasis    Other specified menopausal and perimenopausal disorders    Overweight    Repeated falls    Residual hemorrhoidal skin tags    Past Surgical History:  Procedure Laterality Date   ABDOMINAL HYSTERECTOMY     APPENDECTOMY     CARPAL TUNNEL RELEASE Right    CHOLECYSTECTOMY     COLONOSCOPY  04/09/2016   Mild sigmoid diverticulosis. Otherwise normal colonoscopy   ESOPHAGOGASTRODUODENOSCOPY  09/10/2012   No obvious esophageal stricture, status post empiric esophageal dilation. Irregular GE junction, questionable  significance. Mild gastritis.   eye lid surgery     KNEE SURGERY Left    REPLACEMENT TOTAL KNEE Right 06/03/2021    Family History  Problem Relation Age of Onset   Diabetes type II Mother    Hyperlipidemia Mother    Hypertension Mother    Heart attack Father    Hyperlipidemia Father    Hypertension Father    Stroke Father    Diabetes type II Father    Colon cancer Daughter 58   Cancer Daughter    Esophageal cancer Neg Hx    Rectal cancer Neg Hx    Stomach cancer Neg Hx    Social History   Socioeconomic History   Marital status: Married    Spouse name: Joneen Boers   Number of children: 4   Years of education: Not on file   Highest education level: Not on file  Occupational History   Occupation: Homemaker  Tobacco Use   Smoking status: Never   Smokeless tobacco: Never   Vaping Use   Vaping Use: Never used  Substance and Sexual Activity   Alcohol use: Never   Drug use: Never   Sexual activity: Not on file  Other Topics Concern   Not on file  Social History Narrative   Fenix spends a lot if time helping her daughter get to and from oncology appointments   Her husband has started drinking alcohol again   Social Determinants of Health   Financial Resource Strain: Low Risk  (08/06/2021)   Overall Financial Resource Strain (CARDIA)    Difficulty of Paying Living Expenses: Not hard at all  Food Insecurity: No Food Insecurity (04/03/2020)   Hunger Vital Sign    Worried About Running Out of Food in the Last Year: Never true    [place] in the Last Year: Never true  Transportation Needs: No Transportation Needs (08/06/2021)   PRAPARE - Hydrologist (Medical): No    Lack of Transportation (Non-Medical): No  Physical Activity: Not on file  Stress: Stress Concern Present (09/06/2020)   [place]    Feeling of Stress : To some extent  Social Connections: Not on file    Review of Systems  Constitutional:  Positive for fatigue. Negative for appetite change and fever.  HENT:  Positive for rhinorrhea and sinus pressure. Negative for congestion, ear pain and sore throat.   Respiratory:  Positive for cough. Negative for chest tightness, shortness of breath and wheezing.   Cardiovascular:  Negative for chest pain and palpitations.  Gastrointestinal:  Negative for abdominal pain, constipation, diarrhea, nausea and vomiting.  Genitourinary:  Negative for dysuria and hematuria.  Musculoskeletal:  Positive for arthralgias (right knee pain). Negative for back pain, joint swelling and myalgias.  Skin:  Negative for rash.  Neurological:  Positive for light-headedness. Negative for dizziness, weakness and headaches.  Psychiatric/Behavioral:  Negative for dysphoric mood.  The patient is not nervous/anxious.      Objective:  BP 130/68   Pulse 76   Temp (!) 97.3 F (36.3 C)   Resp 14   Ht '5\' 1"'$  (1.549 m)   Wt 147 lb (66.7 kg)   BMI 27.78 kg/m      01/14/2022    7:45 AM 10/03/2021    8:19 AM 07/06/2021   12:40 PM  BP/Weight  Systolic BP 937 902 409  Diastolic BP 68 60 60  Wt. (Lbs) 147 144.2 138  BMI 27.78 kg/m2 27.25 kg/m2 25.72 kg/m2    Physical Exam Vitals reviewed.  Constitutional:      Appearance: Normal appearance. She is normal weight.  HENT:     Right Ear: Tympanic membrane, ear canal and external ear normal.     Left Ear: Tympanic membrane, ear canal and external ear normal.     Nose: Congestion present.     Mouth/Throat:     Pharynx: Oropharynx is clear. Posterior oropharyngeal erythema (mild. pnd.) present.  Cardiovascular:     Rate and Rhythm: Normal rate and regular rhythm.     Pulses: Normal pulses.     Heart sounds: Normal heart sounds. No murmur heard. Pulmonary:     Effort: Pulmonary effort is normal. No respiratory distress.     Breath sounds: Normal breath sounds.  Abdominal:     General: Abdomen is flat. Bowel sounds are normal.     Palpations: Abdomen is soft.  Lymphadenopathy:     Cervical: No cervical adenopathy.  Neurological:     Mental Status: She is alert and oriented to person, place, and time.  Psychiatric:        Mood and Affect: Mood normal.        Behavior: Behavior normal.     Diabetic Foot Exam - Simple   Simple Foot Form Diabetic Foot exam was performed with the following findings: Yes 01/14/2022  8:26 AM  Visual Inspection See comments: Yes Sensation Testing Intact to touch and monofilament testing bilaterally: Yes Pulse Check Posterior Tibialis and Dorsalis pulse intact bilaterally: Yes Comments Hammer toe on left foot.      Lab Results  Component Value Date   WBC 5.3 10/03/2021   HGB 14.2 10/03/2021   HCT 42.1 10/03/2021   PLT 153 10/03/2021   GLUCOSE 154 (H) 10/03/2021   CHOL  171 10/03/2021   TRIG 102 10/03/2021   HDL 50 10/03/2021   LDLCALC 102 (H) 10/03/2021   ALT 12 10/03/2021   AST 15 10/03/2021   NA 145 (H) 10/03/2021   K 4.5 10/03/2021   CL 106 10/03/2021   CREATININE 1.40 (H) 10/03/2021   BUN 28 (H) 10/03/2021   CO2 24 10/03/2021   TSH 1.890 07/04/2021   INR 1.0 12/14/2020   HGBA1C 7.7 (H) 10/03/2021   MICROALBUR 30 03/29/2021      Assessment & Plan:   Problem List Items Addressed This Visit       Cardiovascular and Mediastinum   RESOLVED: Hypertension associated with diabetes ([place]) (Chronic)   Relevant Orders   CBC With Diff/Platelet   Comprehensive metabolic panel     Respiratory   Non-seasonal allergic rhinitis due to pollen    Start singulair. May continue zyrtec or wean off.         Digestive   GERD without esophagitis    Continue protonix 40 mg daily.         Endocrine   Diabetic glomerulopathy ([place]) - Primary    Control: good Recommend check feet daily. Recommend annual eye exams. Medicines: no changes. Continue to work on eating a healthy diet and exercise.  Labs drawn today.         Relevant Orders   Hemoglobin A1c     Genitourinary   Hypertensive kidney disease with stage 3b chronic kidney disease ([place])    Control: good Recommend check sugars fasting daily. Recommend check feet daily. Recommend annual eye exams. Medicines: continue Losartan 100 mg daily.   Continue to work on eating a healthy  diet and exercise.  Labs drawn today.         Stage 3b chronic kidney disease (CKD) ([place])     Other   Mixed hyperlipidemia    Well controlled.  No changes to medicines. Continue taking Rosuvastatin 20 mg daily, Vascepa 2 grams twice daily. Continue to work on eating a healthy diet and exercise.  Labs drawn today.        Relevant Orders   CBC With Diff/Platelet   Lipid panel   Mild recurrent major depression ([place])    The current medical regimen is effective;  continue present plan and medications.        Relevant Medications   FLUoxetine (PROZAC) 20 MG capsule  .  Meds ordered this encounter  Medications   montelukast (SINGULAIR) 10 MG tablet    Sig: Take 1 tablet (10 mg total) by mouth at bedtime.    Dispense:  90 tablet    Refill:  3   FLUoxetine (PROZAC) 20 MG capsule    Sig: Take 2 capsules (40 mg total) by mouth daily.    Dispense:  180 capsule    Refill:  1    Orders Placed This Encounter  Procedures   CBC With Diff/Platelet   Comprehensive metabolic panel   Lipid panel   Hemoglobin A1c     Follow-up: Return for chronic fasting, awv due now (please schedule before September.).  An After Visit Summary was printed and given to the patient.  [name], MD [facility] [phone]

## 2022-01-14 ENCOUNTER — Ambulatory Visit (INDEPENDENT_AMBULATORY_CARE_PROVIDER_SITE_OTHER): Payer: PPO | Admitting: Family Medicine

## 2022-01-14 ENCOUNTER — Ambulatory Visit (INDEPENDENT_AMBULATORY_CARE_PROVIDER_SITE_OTHER): Payer: PPO

## 2022-01-14 ENCOUNTER — Encounter: Payer: Self-pay | Admitting: Family Medicine

## 2022-01-14 VITALS — BP 130/68 | HR 76 | Temp 97.3°F | Resp 14 | Ht 61.0 in | Wt 147.0 lb

## 2022-01-14 DIAGNOSIS — E782 Mixed hyperlipidemia: Secondary | ICD-10-CM

## 2022-01-14 DIAGNOSIS — K219 Gastro-esophageal reflux disease without esophagitis: Secondary | ICD-10-CM

## 2022-01-14 DIAGNOSIS — I129 Hypertensive chronic kidney disease with stage 1 through stage 4 chronic kidney disease, or unspecified chronic kidney disease: Secondary | ICD-10-CM

## 2022-01-14 DIAGNOSIS — I152 Hypertension secondary to endocrine disorders: Secondary | ICD-10-CM | POA: Diagnosis not present

## 2022-01-14 DIAGNOSIS — N1832 Chronic kidney disease, stage 3b: Secondary | ICD-10-CM | POA: Insufficient documentation

## 2022-01-14 DIAGNOSIS — E1121 Type 2 diabetes mellitus with diabetic nephropathy: Secondary | ICD-10-CM | POA: Diagnosis not present

## 2022-01-14 DIAGNOSIS — J301 Allergic rhinitis due to pollen: Secondary | ICD-10-CM | POA: Diagnosis not present

## 2022-01-14 DIAGNOSIS — E1159 Type 2 diabetes mellitus with other circulatory complications: Secondary | ICD-10-CM

## 2022-01-14 DIAGNOSIS — F330 Major depressive disorder, recurrent, mild: Secondary | ICD-10-CM | POA: Diagnosis not present

## 2022-01-14 MED ORDER — MONTELUKAST SODIUM 10 MG PO TABS: 10.0000 mg | ORAL_TABLET | Freq: Every day | ORAL | 3 refills | Status: DC

## 2022-01-14 MED ORDER — FLUOXETINE HCL 20 MG PO CAPS: 40.0000 mg | ORAL_CAPSULE | Freq: Every day | ORAL | 1 refills | Status: DC

## 2022-01-14 NOTE — Patient Instructions (Signed)
Allergic rhinitis: start on singulair 10 mg daily.

## 2022-01-14 NOTE — Assessment & Plan Note (Signed)
Continue protonix 40mg daily.  

## 2022-01-14 NOTE — Assessment & Plan Note (Signed)
The current medical regimen is effective;  continue present plan and medications.  

## 2022-01-14 NOTE — Assessment & Plan Note (Signed)
Control: good Recommend check sugars fasting daily. Recommend check feet daily. Recommend annual eye exams. Medicines: continue Losartan 100 mg daily.   Continue to work on eating a healthy diet and exercise.  Labs drawn today.

## 2022-01-14 NOTE — Assessment & Plan Note (Signed)
Well controlled.  No changes to medicines. Continue taking Rosuvastatin 20 mg daily, Vascepa 2 grams twice daily. Continue to work on eating a healthy diet and exercise.  Labs drawn today.

## 2022-01-14 NOTE — Assessment & Plan Note (Signed)
Start singulair. May continue zyrtec or wean off.

## 2022-01-14 NOTE — Progress Notes (Signed)
Chronic Care Management Pharmacy Note  01/14/2022 Name:  Alicia Jordan MRN:  [id number] DOB:  [birth year]   Summary:  Patient was at grocery store. She forgot about visit. Counseled patient on importance of being home with meds for visit. Will have concierge reschedule  Plan Updates:  Patient has been paying for [place] all year. PAP was done in December. Will have concierge coordinate with patient to find disconnect. Re-do PAP in December 2022 Will re-do Vascepa grant in December for 2024  Subjective: Alicia Jordan is an 76 y.o. year old female who is a primary patient of Cox, Kirsten, MD.  The CCM team was consulted for assistance with disease management and care coordination needs.    Engaged with patient face to face for follow up visit in response to provider referral for pharmacy case management and/or care coordination services.   Consent to Services:  The patient was given information about Chronic Care Management services, agreed to services, and gave verbal consent prior to initiation of services.  Please see initial visit note for detailed documentation.   Patient Care Team: [name], MD as PCP - General (Family Medicine) Jackquline Denmark, MD as Consulting Physician (Gastroenterology) Joya Salm, MD as Referring Physician (Orthopedic Surgery) Vickey Huger, MD as Consulting Physician (Orthopedic Surgery) Lane Hacker, [facility] (Pharmacist)   Recent office visits:  None   Recent consult visits:  12/05/21 Donia Ast PA-C. Seen for post op. Noted D/C Simvastatin $RemoveBeforeD'40mg'EnNbgNKMYwEbRp$ .    Hospital visits:  None   Objective:  Lab Results  Component Value Date   CREATININE 1.40 (H) 10/03/2021   BUN 28 (H) 10/03/2021   GFRNONAA 41 (L) 05/04/2020   GFRAA 47 (L) 05/04/2020   NA 145 (H) 10/03/2021   K 4.5 10/03/2021   CALCIUM 9.6 10/03/2021   CO2 24 10/03/2021   GLUCOSE 154 (H) 10/03/2021    Lab Results  Component Value Date/Time   HGBA1C 7.7 (H) 10/03/2021  12:00 AM   HGBA1C 6.8 (H) 07/04/2021 09:49 AM   MICROALBUR 30 03/29/2021 08:59 AM   MICROALBUR 30 05/04/2020 09:13 AM    Last diabetic Eye exam:  Lab Results  Component Value Date/Time   HMDIABEYEEXA No Retinopathy 02/09/2020 12:00 AM    Last diabetic Foot exam: No results found for: "HMDIABFOOTEX"   Lab Results  Component Value Date   CHOL 171 10/03/2021   HDL 50 10/03/2021   LDLCALC 102 (H) 10/03/2021   TRIG 102 10/03/2021   CHOLHDL 3.6 07/04/2021       Latest Ref Rng & Units 10/03/2021   12:00 AM 07/04/2021    9:49 AM 03/29/2021    9:10 AM  Hepatic Function  Total Protein 6.0 - 8.5 g/dL 7.1  7.3  7.2   Albumin 3.7 - 4.7 g/dL 4.7  4.5  4.7   AST 0 - 40 IU/L $Remov'15  16  14   'MVFunx$ ALT 0 - 32 IU/L $Remov'12  10  9   'mNFhSz$ Alk Phosphatase 44 - 121 IU/L 68  131  66   Total Bilirubin 0.0 - 1.2 mg/dL 0.9  0.5  0.9     Lab Results  Component Value Date/Time   TSH 1.890 07/04/2021 09:49 AM   TSH 4.890 (H) 12/14/2020 09:45 AM   FREET4 1.31 07/04/2021 09:49 AM       Latest Ref Rng & Units 10/03/2021   12:00 AM 07/04/2021    9:49 AM 03/29/2021    9:10 AM  CBC  WBC 3.4 -  10.8 x10E3/uL 5.3  6.8  5.5   Hemoglobin 11.1 - 15.9 g/dL 14.2  12.9  14.2   Hematocrit 34.0 - 46.6 % 42.1  39.5  45.1   Platelets 150 - 450 x10E3/uL 153  229  175     Lab Results  Component Value Date/Time   VD25OH 32.3 07/04/2021 09:49 AM   VD25OH 28.0 (L) 03/29/2021 09:10 AM    Clinical ASCVD: Yes  The 10-year ASCVD risk score (Arnett DK, et al., 2019) is: 33.9%   Values used to calculate the score:     Age: 54 years     Sex: Female     Is Non-Hispanic African American: No     Diabetic: Yes     Tobacco smoker: No     Systolic Blood Pressure: 016 mmHg     Is BP treated: Yes     HDL Cholesterol: 50 mg/dL     Total Cholesterol: 171 mg/dL       01/14/2022    7:48 AM 07/06/2021   12:41 PM 03/29/2021    8:06 AM  Depression screen PHQ 2/9  Decreased Interest 0 0 0  Down, Depressed, Hopeless 0 0 1  PHQ - 2 Score 0  0 1  Altered sleeping 0 1 1  Tired, decreased energy 1 1 0  Change in appetite 0 1 1  Feeling bad or failure about yourself  0 0 0  Trouble concentrating 0 0 0  Moving slowly or fidgety/restless 0 0 0  Suicidal thoughts 0 0 0  PHQ-9 Score $RemoveBef'1 3 3  'xipHedjoYt$ Difficult doing work/chores Not difficult at all Not difficult at all Not difficult at all     Other: (CHADS2VASc if Afib, MMRC or CAT for COPD, ACT, DEXA)  Social History   Tobacco Use  Smoking Status Never  Smokeless Tobacco Never   BP Readings from Last 3 Encounters:  01/14/22 130/68  10/03/21 116/60  07/06/21 120/60   Pulse Readings from Last 3 Encounters:  01/14/22 76  10/03/21 72  07/06/21 76   Wt Readings from Last 3 Encounters:  01/14/22 147 lb (66.7 kg)  10/03/21 144 lb 3.2 oz (65.4 kg)  07/06/21 138 lb (62.6 kg)   BMI Readings from Last 3 Encounters:  01/14/22 27.78 kg/m  10/03/21 27.25 kg/m  07/06/21 25.72 kg/m    Assessment/Interventions: Review of patient past medical history, allergies, medications, health status, including review of consultants reports, laboratory and other test data, was performed as part of comprehensive evaluation and provision of chronic care management services.   SDOH:  (Social Determinants of Health) assessments and interventions performed: Yes SDOH Interventions    Flowsheet Row Most Recent Value  SDOH Interventions   Financial Strain Interventions Other (Comment)  [PAP]  Transportation Interventions Intervention Not Indicated      SDOH Screenings   Alcohol Screen: Not on file  Depression (PHQ2-9): Low Risk  (01/14/2022)   Depression (PHQ2-9)    PHQ-2 Score: 1  Financial Resource Strain: High Risk (01/14/2022)   Overall Financial Resource Strain (CARDIA)    Difficulty of Paying Living Expenses: Hard  Food Insecurity: No Food Insecurity (04/03/2020)   Hunger Vital Sign    Worried About Running Out of Food in the Last Year: Never true    Ran Out of Food in the Last Year:  Never true  Housing: Low Risk  (09/06/2020)   Housing    Last Housing Risk Score: 0  Physical Activity: Not on file  Social Connections: Not on  file  Stress: Stress Concern Present (09/06/2020)   [place]    Feeling of Stress : To some extent  Tobacco Use: Low Risk  (01/14/2022)   Patient History    Smoking Tobacco Use: Never    Smokeless Tobacco Use: Never    Passive Exposure: Not on file  Transportation Needs: No Transportation Needs (01/14/2022)   PRAPARE - Transportation    Lack of Transportation (Medical): No    Lack of Transportation (Non-Medical): No    CCM Care Plan  Allergies  Allergen Reactions   Meloxicam     Medications Reviewed Today     Reviewed by Lane Hacker, [facility] (Pharmacist) on 01/14/22 at 1332  Med List Status: <None>   Medication Order Taking? Sig Documenting Provider Last Dose Status Informant  Acetaminophen (TYLENOL PO) [id number] No Take by mouth as needed. [provider] Taking Active   Cetirizine HCl (ZYRTEC ALLERGY) 10 MG CAPS [id number] No Take 1 capsule (10 mg total) by mouth daily. Cox, Kirsten, MD Taking Active   Cholecalciferol (VITAMIN D3) 25 MCG (1000 UT) CAPS [id number] No Take 1 capsule (1,000 Units total) by mouth daily. Cox, Kirsten, MD Taking Active   dapagliflozin propanediol (FARXIGA) 10 MG TABS tablet [id number] No Take 1 tablet (10 mg total) by mouth daily before breakfast. Cox, Kirsten, MD Taking Active   Ferrous Sulfate (IRON PO) [id number] No Take 2 tablets by mouth daily in the afternoon. [provider] Taking Active   FLUoxetine (PROZAC) 20 MG capsule [id number]  Take 2 capsules (40 mg total) by mouth daily. Cox, Kirsten, MD  Active   glucose blood test strip [id number] No 1 each by Other route 2 (two) times daily. Use as instructed Lillard Anes, MD Taking Active   icosapent Ethyl (VASCEPA) 1 g capsule [id number] No Take 2 capsules (2 g total)  by mouth 2 (two) times daily. Cox, Kirsten, MD Taking Active   losartan (COZAAR) 100 MG tablet [id number]  Take 1 tablet (100 mg total) by mouth daily. Lillard Anes, MD  Active   montelukast (SINGULAIR) 10 MG tablet [id number]  Take 1 tablet (10 mg total) by mouth at bedtime. Cox, Kirsten, MD  Active   pantoprazole (PROTONIX) 40 MG tablet [id number] No Take 1 tablet (40 mg total) by mouth daily. [name], MD Taking Active   PX Lancets MicroThin 33G [place] [id number] No 1 each by Does not apply route 2 (two) times daily. Lillard Anes, MD Taking Active   rosuvastatin (CRESTOR) 20 MG tablet [id number]  Take 1 tablet (20 mg total) by mouth daily. Lillard Anes, MD  Active             Patient Active Problem List   Diagnosis Date Noted   Non-seasonal allergic rhinitis due to pollen 01/14/2022   Stage 3b chronic kidney disease (CKD) ([place]) 01/14/2022   Subacute cough 07/06/2021   Mild recurrent major depression ([place]) 04/14/2021   Preoperative clearance 04/14/2021   Vitamin D deficiency 03/31/2021   Primary osteoarthritis of both knees 03/31/2021   Left carpal tunnel syndrome 03/31/2021   Diabetic glomerulopathy ([place]) 03/31/2021   Iron deficiency anemia 02/09/2021   B12 deficiency 02/09/2021   Hypertensive kidney disease with stage 3b chronic kidney disease ([place]) 09/15/2020   Chronic pain of both knees 04/17/2020   Chronic right shoulder pain 08/21/2019   Dyslipidemia associated with type 2 diabetes mellitus ([place]) 08/11/2019   Mixed hyperlipidemia 08/11/2019   GERD without  esophagitis 04/18/2009    Immunization History  Administered Date(s) Administered   Fluad Quad(high Dose 65+) 02/08/2019, 03/29/2021   Influenza-Unspecified 02/27/2020   Moderna Sars-Covid-2 Vaccination 10/31/2019, 11/28/2019, 05/31/2020, 10/12/2020   PNEUMOCOCCAL CONJUGATE-20 11/22/2021   Pfizer Covid-19 Vaccine Bivalent Booster 51yrs & up 03/29/2021   Pneumococcal Conjugate-13 05/13/2014    Pneumococcal Polysaccharide-23 01/29/2017   Tdap 05/03/2012   Zoster Recombinat (Shingrix) 06/11/2017   Zoster, Live 05/16/2010    Conditions to be addressed/monitored:  Hypertension, Hyperlipidemia and Diabetes  Care Plan : [place]  Updates made by Lane Hacker, RPH since 01/14/2022 12:00 AM     Problem: htn, hld, dm   Priority: High  Onset Date: 11/13/2020     Long-Range Goal: Disease State Management   Start Date: 11/13/2020  Expected End Date: 11/13/2021  Recent Progress: On track  Priority: High  Note:   Current Barriers: Unable to independently afford treatment regimen   Pharmacist Clinical Goal(s):  Patient will verbalize ability to afford treatment regimen through collaboration with PharmD and provider.   Interventions: 1:1 collaboration with [name], MD regarding development and update of comprehensive plan of care as evidenced by provider attestation and co-signature Inter-disciplinary care team collaboration (see longitudinal plan of care) Comprehensive medication review performed; medication list updated in electronic medical record   Hypertension (BP goal <130/80) BP Readings from Last 3 Encounters:  07/06/21 120/60  06/26/21 120/60  05/20/21 110/80  -Controlled. Diagnosed >25 years ago.  -Current treatment: Losartan 100 mg daily Appropriate, Effective, Safe, Accessible -Medications previously tried: Losartan-hydrochlorothiazide -Current home readings: well controlled -Current dietary habits: eats lunch out and working to [employer] -Current exercise habits: [place] [employer] -Denies hypotensive/hypertensive symptoms -Educated on BP goals and benefits of medications for prevention of heart attack, stroke and kidney damage; Daily salt intake goal < 2300 mg; Exercise goal of 150 minutes per week; -Counseled to monitor BP at home as needed, document, and provide log at future appointments -Counseled on  diet and exercise extensively Recommended to continue current medication   Hyperlipidemia: (LDL goal < 70) The 10-year ASCVD risk score (Arnett DK, et al., 2019) is: 26.9%   Values used to calculate the score:     Age: 64 years     Sex: Female     Is Non-Hispanic African American: No     Diabetic: Yes     Tobacco smoker: No     Systolic Blood Pressure: 832 mmHg     Is BP treated: Yes     HDL Cholesterol: 45 mg/dL     Total Cholesterol: 162 mg/dL Lab Results  Component Value Date   CHOL 162 07/04/2021   CHOL 179 03/29/2021   CHOL 139 12/14/2020   Lab Results  Component Value Date   HDL 45 07/04/2021   HDL 49 03/29/2021   HDL 53 12/14/2020   Lab Results  Component Value Date   LDLCALC 85 07/04/2021   LDLCALC 93 03/29/2021   LDLCALC 65 12/14/2020   Lab Results  Component Value Date   TRIG 191 (H) 07/04/2021   TRIG 217 (H) 03/29/2021   TRIG 116 12/14/2020   Lab Results  Component Value Date   CHOLHDL 3.6 07/04/2021   CHOLHDL 3.7 03/29/2021   CHOLHDL 2.6 12/14/2020  No results found for: LDLDIRECT -Not ideally controlled -Current treatment: Rosuvastatin 20mg  mg daily at bedtime Appropriate, Effective, Safe, Accessible Vascepa 2 BID Query Appropriate, Query effective, Query Safe, Query accessible -Medications  previously tried: none reported -Current dietary patterns: eats out at lunch. Trying to make smarter decisions -Current exercise habits: [place] [employer] -Educated on Cholesterol goals; Benefits of statin for ASCVD risk reduction; Importance of limiting foods high in cholesterol; Exercise goal of 150 minutes per week; -Counseled on diet and exercise extensively Feb 2023: Recommend Atorvastatin in case renal function worsens. Patient not taking Vascepa due to fears of it being a "High dose." Used metaphor explaining how 1000 feathers still weighs less than 100 bricks and that you can't determine how "strong" a med is based on the mg    Diabetes (A1c goal <7%) Lab Results  Component Value Date   HGBA1C 6.8 (H) 07/04/2021   HGBA1C 7.3 (H) 03/29/2021   HGBA1C 7.1 (H) 12/14/2020   Lab Results  Component Value Date   MICROALBUR 30 03/29/2021   LDLCALC 85 07/04/2021   CREATININE 1.22 (H) 07/04/2021   Lab Results  Component Value Date   NA 142 07/04/2021   K 4.5 07/04/2021   CREATININE 1.22 (H) 07/04/2021   EGFR 47 (L) 07/04/2021   GFRNONAA 41 (L) 05/04/2020   GLUCOSE 156 (H) 07/04/2021   Lab Results  Component Value Date   WBC 6.8 07/04/2021   HGB 12.9 07/04/2021   HCT 39.5 07/04/2021   MCV 93 07/04/2021   PLT 229 07/04/2021   -Not ideally controlled -Current medications: Farxiga 10mg  (PAP 2023) Appropriate, Effective, Safe, Accessible -Medications previously tried: [place] -Current home glucose readings fasting glucose: ~130 mg/dL post prandial glucose: not checking -Denies hypoglycemic/hyperglycemic symptoms -Current meal patterns: Trying to make smart choices. Eats out with daughter during lunch some and sandwich or simple lunch other days.Cereal for breakfast.  -Current exercise: [place] [employer] but unable to currently due to anemia.  -Educated on A1c and blood sugar goals; Complications of diabetes including kidney damage, retinal damage, and cardiovascular disease; Exercise goal of 150 minutes per week; Benefits of weight loss; Benefits of routine self-monitoring of blood sugar; Carbohydrate counting and/or plate method -Counseled to check feet daily and get yearly eye exams -Counseled on diet and exercise extensively August 2023: Patient was at the grocery store when I called. She forgot about the visit. She stated she's having trouble paying for [place]. I asked why because we filled out PAP for her in December 2022. She does not remember that. Counseled her to be home and will reschedule appt ASAP.     Patient Goals/Self-Care Activities Patient  will: - take medications as prescribed focus on medication adherence by using pill box check glucose daily, document, and provide at future appointments target a minimum of 150 minutes of moderate intensity exercise weekly  Follow Up Plan: Telephone follow up appointment with care management team member scheduled for: ~05/2022  [place] Constable, Pharm.D. [phone]        Medication Assistance:  Farxiga PAP for 2023 completed in December 2022. Patient states she's been paying for it all year. Will have concierge look into this    Patient's preferred pharmacy is:  [facility] [address] [place] [address] [place] [address] Phone: [phone] Fax: [place], [place] [address] [place] [address] Phone: [phone] Fax: [phone]  [facility] - [place], [place] - [address] [employer]  [place] [address] Phone: [phone] Fax: [phone]   Uses pill box? Yes Pt endorses 100% compliance  We discussed: Benefits of medication synchronization, packaging and delivery as well as enhanced pharmacist oversight with [facility]. Patient decided to: Utilize [facility] for medication synchronization, packaging and delivery -Verbal consent obtained for [facility] enhanced pharmacy services (medication synchronization, adherence packaging, delivery coordination). A medication sync plan was created to allow patient to get all medications delivered once every 30 to 90 days per patient preference. Patient understands they have freedom to choose pharmacy and clinical pharmacist will coordinate care between all prescribers and [facility].   Care Plan and Follow Up Patient Decision:  Patient agrees to Care Plan and Follow-up.  Plan: Telephone follow up appointment with care management team member scheduled for:  December  2023  [place] Constable, [place].D. - [phone]

## 2022-01-14 NOTE — Assessment & Plan Note (Addendum)
Control: good ?Recommend check feet daily. ?Recommend annual eye exams. ?Medicines: no changes ?Continue to work on eating a healthy diet and exercise.  ?Labs drawn today.   ? ?

## 2022-01-14 NOTE — Patient Instructions (Signed)
Visit Information   Goals Addressed   None    Patient Care Plan: CCM Pharmacy Care Plan     Problem Identified: htn, hld, dm   Priority: High  Onset Date: 11/13/2020     Long-Range Goal: Disease State Management   Start Date: 11/13/2020  Expected End Date: 11/13/2021  Recent Progress: On track  Priority: High  Note:   Current Barriers: Unable to independently afford treatment regimen   Pharmacist Clinical Goal(s):  Patient will verbalize ability to afford treatment regimen through collaboration with PharmD and provider.   Interventions: 1:1 collaboration with [name], MD regarding development and update of comprehensive plan of care as evidenced by provider attestation and co-signature Inter-disciplinary care team collaboration (see longitudinal plan of care) Comprehensive medication review performed; medication list updated in electronic medical record   Hypertension (BP goal <130/80) BP Readings from Last 3 Encounters:  07/06/21 120/60  06/26/21 120/60  05/20/21 110/80  -Controlled. Diagnosed >25 years ago.  -Current treatment: Losartan 100 mg daily Appropriate, Effective, Safe, Accessible -Medications previously tried: Losartan-hydrochlorothiazide -Current home readings: well controlled -Current dietary habits: eats lunch out and working to [employer] -Current exercise habits: [place] [employer] -Denies hypotensive/hypertensive symptoms -Educated on BP goals and benefits of medications for prevention of heart attack, stroke and kidney damage; Daily salt intake goal < 2300 mg; Exercise goal of 150 minutes per week; -Counseled to monitor BP at home as needed, document, and provide log at future appointments -Counseled on diet and exercise extensively Recommended to continue current medication   Hyperlipidemia: (LDL goal < 70) The 10-year ASCVD risk score (Arnett DK, et al., 2019) is: 26.9%   Values used to calculate the score:      Age: 51 years     Sex: Female     Is Non-Hispanic African American: No     Diabetic: Yes     Tobacco smoker: No     Systolic Blood Pressure: 297 mmHg     Is BP treated: Yes     HDL Cholesterol: 45 mg/dL     Total Cholesterol: 162 mg/dL Lab Results  Component Value Date   CHOL 162 07/04/2021   CHOL 179 03/29/2021   CHOL 139 12/14/2020   Lab Results  Component Value Date   HDL 45 07/04/2021   HDL 49 03/29/2021   HDL 53 12/14/2020   Lab Results  Component Value Date   LDLCALC 85 07/04/2021   LDLCALC 93 03/29/2021   LDLCALC 65 12/14/2020   Lab Results  Component Value Date   TRIG 191 (H) 07/04/2021   TRIG 217 (H) 03/29/2021   TRIG 116 12/14/2020   Lab Results  Component Value Date   CHOLHDL 3.6 07/04/2021   CHOLHDL 3.7 03/29/2021   CHOLHDL 2.6 12/14/2020  No results found for: LDLDIRECT -Not ideally controlled -Current treatment: Rosuvastatin 20mg  mg daily at bedtime Appropriate, Effective, Safe, Accessible Vascepa 2 BID Query Appropriate, Query effective, Query Safe, Query accessible -Medications previously tried: none reported -Current dietary patterns: eats out at lunch. Trying to make smarter decisions -Current exercise habits: [place] -Educated on Cholesterol goals; Benefits of statin for ASCVD risk reduction; Importance of limiting foods high in cholesterol; Exercise goal of 150 minutes per week; -Counseled on diet and exercise extensively Feb 2023: Recommend Atorvastatin in case renal function worsens. Patient not taking Vascepa due to fears of it being a "High dose." Used metaphor explaining how 1000  feathers still weighs less than 100 bricks and that you can't determine how "strong" a med is based on the mg   Diabetes (A1c goal <7%) Lab Results  Component Value Date   HGBA1C 6.8 (H) 07/04/2021   HGBA1C 7.3 (H) 03/29/2021   HGBA1C 7.1 (H) 12/14/2020   Lab Results  Component Value Date   MICROALBUR 30 03/29/2021    LDLCALC 85 07/04/2021   CREATININE 1.22 (H) 07/04/2021   Lab Results  Component Value Date   NA 142 07/04/2021   K 4.5 07/04/2021   CREATININE 1.22 (H) 07/04/2021   EGFR 47 (L) 07/04/2021   GFRNONAA 41 (L) 05/04/2020   GLUCOSE 156 (H) 07/04/2021   Lab Results  Component Value Date   WBC 6.8 07/04/2021   HGB 12.9 07/04/2021   HCT 39.5 07/04/2021   MCV 93 07/04/2021   PLT 229 07/04/2021   -Not ideally controlled -Current medications: Farxiga 10mg  (PAP 2023) Appropriate, Effective, Safe, Accessible -Medications previously tried: [place] -Current home glucose readings fasting glucose: ~130 mg/dL post prandial glucose: not checking -Denies hypoglycemic/hyperglycemic symptoms -Current meal patterns: Trying to make smart choices. Eats out with daughter during lunch some and sandwich or simple lunch other days.Cereal for breakfast.  -Current exercise: [place] [employer] [place] unable to currently due to anemia.  -Educated on A1c and blood sugar goals; Complications of diabetes including kidney damage, retinal damage, and cardiovascular disease; Exercise goal of 150 minutes per week; Benefits of weight loss; Benefits of routine self-monitoring of blood sugar; Carbohydrate counting and/or plate method -Counseled to check feet daily and get yearly eye exams -Counseled on diet and exercise extensively August 2023: Patient was at the grocery store when I called. She forgot about the visit. She stated she's having trouble paying for [place]. I asked why because we filled out PAP for her in December 2022. She does not remember that. Counseled her to be home and will reschedule appt ASAP.     Patient Goals/Self-Care Activities Patient will: - take medications as prescribed focus on medication adherence by using pill box check glucose daily, document, and provide at future appointments target a minimum of 150 minutes of moderate intensity  exercise weekly  Follow Up Plan: Telephone follow up appointment with care management team member scheduled for: ~05/2022  [place] Constable, Pharm.D. - [phone]      Ms. Trent was given information about Chronic Care Management services today including:  CCM service includes personalized support from designated clinical staff supervised by her physician, including individualized plan of care and coordination with other care providers 24/7 contact phone numbers for assistance for urgent and routine care needs. Standard insurance, coinsurance, copays and deductibles apply for chronic care management only during months in which we provide at least 20 minutes of these services. Most insurances cover these services at 100%, however patients may be responsible for any copay, coinsurance and/or deductible if applicable. This service may help you avoid the need for more expensive face-to-face services. Only one practitioner may furnish and bill the service in a calendar month. The patient may stop CCM services at any time (effective at the end of the month) by phone call to the office staff.  Patient agreed to services and verbal consent obtained.   The patient verbalized understanding of instructions, educational materials, and care plan provided today and DECLINED offer to receive copy of patient instructions, educational materials, and care plan.  The pharmacy team will reach out to  the patient again over the next 60 days.   Lane Hacker, [place]

## 2022-01-15 DIAGNOSIS — E1169 Type 2 diabetes mellitus with other specified complication: Secondary | ICD-10-CM | POA: Diagnosis not present

## 2022-01-15 DIAGNOSIS — E1122 Type 2 diabetes mellitus with diabetic chronic kidney disease: Secondary | ICD-10-CM | POA: Diagnosis not present

## 2022-01-15 DIAGNOSIS — N183 Chronic kidney disease, stage 3 unspecified: Secondary | ICD-10-CM | POA: Diagnosis not present

## 2022-01-15 DIAGNOSIS — F330 Major depressive disorder, recurrent, mild: Secondary | ICD-10-CM | POA: Diagnosis not present

## 2022-01-15 DIAGNOSIS — J309 Allergic rhinitis, unspecified: Secondary | ICD-10-CM | POA: Diagnosis not present

## 2022-01-15 DIAGNOSIS — E1165 Type 2 diabetes mellitus with hyperglycemia: Secondary | ICD-10-CM | POA: Diagnosis not present

## 2022-01-15 DIAGNOSIS — E785 Hyperlipidemia, unspecified: Secondary | ICD-10-CM | POA: Diagnosis not present

## 2022-01-15 DIAGNOSIS — E1142 Type 2 diabetes mellitus with diabetic polyneuropathy: Secondary | ICD-10-CM | POA: Diagnosis not present

## 2022-01-15 DIAGNOSIS — G8929 Other chronic pain: Secondary | ICD-10-CM | POA: Diagnosis not present

## 2022-01-15 DIAGNOSIS — D509 Iron deficiency anemia, unspecified: Secondary | ICD-10-CM | POA: Diagnosis not present

## 2022-01-15 DIAGNOSIS — E663 Overweight: Secondary | ICD-10-CM | POA: Diagnosis not present

## 2022-01-15 DIAGNOSIS — I10 Essential (primary) hypertension: Secondary | ICD-10-CM | POA: Diagnosis not present

## 2022-01-15 LAB — COMPREHENSIVE METABOLIC PANEL
ALT: 14 IU/L (ref 0–32)
AST: 18 IU/L (ref 0–40)
Albumin/Globulin Ratio: 2 (ref 1.2–2.2)
Albumin: 4.7 g/dL (ref 3.8–4.8)
Alkaline Phosphatase: 67 IU/L (ref 44–121)
BUN/Creatinine Ratio: 17 (ref 12–28)
BUN: 24 mg/dL (ref 8–27)
Bilirubin Total: 1 mg/dL (ref 0.0–1.2)
CO2: 22 mmol/L (ref 20–29)
Calcium: 9.9 mg/dL (ref 8.7–10.3)
Chloride: 104 mmol/L (ref 96–106)
Creatinine, Ser: 1.44 mg/dL — ABNORMAL HIGH (ref 0.57–1.00)
Globulin, Total: 2.3 g/dL (ref 1.5–4.5)
Glucose: 173 mg/dL — ABNORMAL HIGH (ref 70–99)
Potassium: 4.2 mmol/L (ref 3.5–5.2)
Sodium: 141 mmol/L (ref 134–144)
Total Protein: 7 g/dL (ref 6.0–8.5)
eGFR: 38 mL/min/{1.73_m2} — ABNORMAL LOW (ref >59)

## 2022-01-15 LAB — LIPID PANEL
Chol/HDL Ratio: 3.4 ratio (ref 0.0–4.4)
Cholesterol, Total: 149 mg/dL (ref 100–199)
HDL: 44 mg/dL (ref >39)
LDL Chol Calc (NIH): 78 mg/dL (ref 0–99)
Triglycerides: 156 mg/dL — ABNORMAL HIGH (ref 0–149)
VLDL Cholesterol Cal: 27 mg/dL (ref 5–40)

## 2022-01-15 LAB — CBC WITH DIFF/PLATELET
Basophils Absolute: 0 10*3/uL (ref 0.0–0.2)
Basos: 1 %
EOS (ABSOLUTE): 0.3 10*3/uL (ref 0.0–0.4)
Eos: 6 %
Hematocrit: 40.2 % (ref 34.0–46.6)
Hemoglobin: 13.4 g/dL (ref 11.1–15.9)
Immature Grans (Abs): 0 10*3/uL (ref 0.0–0.1)
Immature Granulocytes: 0 %
Lymphocytes Absolute: 1.5 10*3/uL (ref 0.7–3.1)
Lymphs: 27 %
MCH: 30.7 pg (ref 26.6–33.0)
MCHC: 33.3 g/dL (ref 31.5–35.7)
MCV: 92 fL (ref 79–97)
Monocytes Absolute: 0.3 10*3/uL (ref 0.1–0.9)
Monocytes: 5 %
Neutrophils Absolute: 3.4 10*3/uL (ref 1.4–7.0)
Neutrophils: 61 %
Platelets: 161 10*3/uL (ref 150–450)
RBC: 4.37 x10E6/uL (ref 3.77–5.28)
RDW: 12.8 % (ref 11.7–15.4)
WBC: 5.5 10*3/uL (ref 3.4–10.8)

## 2022-01-15 LAB — HEMOGLOBIN A1C
Est. average glucose Bld gHb Est-mCnc: 171 mg/dL
Hgb A1c MFr Bld: 7.6 % — ABNORMAL HIGH (ref 4.8–5.6)

## 2022-01-15 LAB — CARDIOVASCULAR RISK ASSESSMENT

## 2022-01-15 NOTE — Progress Notes (Signed)
I spoke with Alicia Jordan today and she stated she did get a letter from [facility] stating she is approved for the [place] for this year and I gave her the number to call them and coordinate her shipment. I am not sure why this was not already shipped out but pt is going to call and get that taking care of. I messaged the pharmacy to transfer the RX for the Montelukast back to [facility]. December Appt has been scheduled.   Elray Mcgregor, [place] Pharmacist Assistant  [phone]

## 2022-01-16 NOTE — Progress Notes (Signed)
Blood count normal.  Liver function normal.  Kidney function abnormal, but stable.  Cholesterol: improved overall. HBA1C: 7.6. recommend add ozempic 0.25 mg once weekly to farxiga '10mg'$  daily.  Dr. Tobie Poet

## 2022-01-21 ENCOUNTER — Telehealth: Payer: Self-pay

## 2022-01-21 NOTE — Progress Notes (Signed)
Care Gap(s) Not Met that Need to be Addressed:  Breast Cancer Screening   Eye Exam for Patients With Diabetes   Action Taken: Pt has an appt DM exam in September. Pt will reach out to [facility] to schedule appt    Follow Up: None

## 2022-01-22 DIAGNOSIS — M5442 Lumbago with sciatica, left side: Secondary | ICD-10-CM | POA: Diagnosis not present

## 2022-01-22 DIAGNOSIS — M5136 Other intervertebral disc degeneration, lumbar region: Secondary | ICD-10-CM | POA: Diagnosis not present

## 2022-01-22 DIAGNOSIS — M9904 Segmental and somatic dysfunction of sacral region: Secondary | ICD-10-CM | POA: Diagnosis not present

## 2022-01-22 DIAGNOSIS — M9903 Segmental and somatic dysfunction of lumbar region: Secondary | ICD-10-CM | POA: Diagnosis not present

## 2022-01-23 ENCOUNTER — Telehealth: Payer: Self-pay

## 2022-01-23 ENCOUNTER — Encounter: Payer: Self-pay | Admitting: Family Medicine

## 2022-01-23 NOTE — Telephone Encounter (Signed)
Patient came to the office and stated that the crestor was causing her to have myalgia, so per Dr. Tobie Poet patient is to switch back to taking her Simvastatin.

## 2022-01-29 NOTE — Progress Notes (Signed)
Patient called number Danielle gave and hung up after 2 minutes. Told them to Hit 1 and then 9 in the dial menu and then to wait on hold until someone helps them

## 2022-02-12 ENCOUNTER — Telehealth: Payer: Self-pay

## 2022-02-12 NOTE — Progress Notes (Signed)
02/12/22- Called [place] to check [place] of Guadalupe Guerra  but was on hold for 10 mins. Will try another day.  02/13/22- Called [place]  and they stated Wilder Glade '10mg'$   will be delivered in 10-14 business days for a 90 day supply. They will be mailing to pt home address we have on file.  Pt has been informed and has a month supply on hand with samples from office to last her until shipment. [place] confirmed she is on automatic refills, so she does not need to call each time she needs a delivery. Pt has been informed of everything     Elray Mcgregor, [place] Pharmacist Assistant  [phone]

## 2022-02-13 DIAGNOSIS — E782 Mixed hyperlipidemia: Secondary | ICD-10-CM | POA: Diagnosis not present

## 2022-02-13 DIAGNOSIS — M79642 Pain in left hand: Secondary | ICD-10-CM | POA: Diagnosis not present

## 2022-02-13 DIAGNOSIS — I10 Essential (primary) hypertension: Secondary | ICD-10-CM

## 2022-02-13 DIAGNOSIS — Z7984 Long term (current) use of oral hypoglycemic drugs: Secondary | ICD-10-CM

## 2022-02-13 DIAGNOSIS — M1812 Unilateral primary osteoarthritis of first carpometacarpal joint, left hand: Secondary | ICD-10-CM | POA: Diagnosis not present

## 2022-02-13 DIAGNOSIS — G5602 Carpal tunnel syndrome, left upper limb: Secondary | ICD-10-CM | POA: Diagnosis not present

## 2022-02-13 DIAGNOSIS — M720 Palmar fascial fibromatosis [Dupuytren]: Secondary | ICD-10-CM | POA: Diagnosis not present

## 2022-02-13 DIAGNOSIS — E1121 Type 2 diabetes mellitus with diabetic nephropathy: Secondary | ICD-10-CM

## 2022-02-19 DIAGNOSIS — E119 Type 2 diabetes mellitus without complications: Secondary | ICD-10-CM | POA: Diagnosis not present

## 2022-02-19 DIAGNOSIS — H04201 Unspecified epiphora, right lacrimal gland: Secondary | ICD-10-CM | POA: Diagnosis not present

## 2022-02-19 DIAGNOSIS — H524 Presbyopia: Secondary | ICD-10-CM | POA: Diagnosis not present

## 2022-02-19 DIAGNOSIS — D3131 Benign neoplasm of right choroid: Secondary | ICD-10-CM | POA: Diagnosis not present

## 2022-02-19 DIAGNOSIS — H25813 Combined forms of age-related cataract, bilateral: Secondary | ICD-10-CM | POA: Diagnosis not present

## 2022-02-19 LAB — HM DIABETES EYE EXAM

## 2022-02-20 ENCOUNTER — Ambulatory Visit (INDEPENDENT_AMBULATORY_CARE_PROVIDER_SITE_OTHER): Payer: PPO

## 2022-02-20 VITALS — BP 130/78 | HR 75 | Resp 16 | Wt 147.0 lb

## 2022-02-20 DIAGNOSIS — Z0000 Encounter for general adult medical examination without abnormal findings: Secondary | ICD-10-CM | POA: Diagnosis not present

## 2022-02-20 DIAGNOSIS — Z1231 Encounter for screening mammogram for malignant neoplasm of breast: Secondary | ICD-10-CM

## 2022-02-20 DIAGNOSIS — N959 Unspecified menopausal and perimenopausal disorder: Secondary | ICD-10-CM

## 2022-02-20 DIAGNOSIS — Z9189 Other specified personal risk factors, not elsewhere classified: Secondary | ICD-10-CM

## 2022-02-20 NOTE — Progress Notes (Signed)
Subjective:   Alicia Jordan is a 76 y.o. female who presents for Medicare Annual (Subsequent) preventive examination.  This wellness visit is conducted by a nurse.  The patient's medications were reviewed and reconciled since the patient's last visit.  History details were provided by the patient.  The history appears to be reliable.    Medical History: Patient history and Family history was reviewed  Medications, Allergies, and preventative health maintenance was reviewed and updated.  Patient states that she has stopped the Crestor due to joint aches and has started taking the Zocor 40 mg again.  Cardiac Risk Factors include: advanced age (>81mn, >>81women);diabetes mellitus;dyslipidemia     Objective:    Today's Vitals   02/20/22 0836  BP: 130/78  Pulse: 75  Resp: 16  SpO2: 98%  Weight: 147 lb (66.7 kg)  PainSc: 0-No pain   Body mass index is 27.78 kg/m.     02/20/2022    9:04 AM 02/01/2021   11:49 AM 09/06/2020    9:44 AM 08/16/2019   10:27 AM  Advanced Directives  Does Patient Have a Medical Advance Directive? Yes Yes Yes Yes  Type of [place]amed[place] will  Healthcare Power of [place]  Does patient want to make changes to medical advance directive? No - Patient declined  No - Patient declined No - Patient declined  Copy of [place] Chart? Yes - validated most recent copy scanned in chart (See row information)  No - copy requested No - copy requested    Current Medications (verified) Outpatient Encounter Medications as of 02/20/2022  Medication Sig   Acetaminophen (TYLENOL PO) Take by mouth as needed.   Cetirizine HCl (ZYRTEC ALLERGY) 10 MG CAPS Take 1 capsule (10 mg total) by mouth daily.   Cholecalciferol (VITAMIN D3) 25 MCG (1000 UT) CAPS Take 1 capsule (1,000 Units total) by mouth daily.   dapagliflozin propanediol (FARXIGA) 10 MG TABS tablet Take 1 tablet (10 mg  total) by mouth daily before breakfast.   Ferrous Sulfate (IRON PO) Take 2 tablets by mouth daily in the afternoon.   FLUoxetine (PROZAC) 20 MG capsule Take 2 capsules (40 mg total) by mouth daily.   glucose blood test strip 1 each by Other route 2 (two) times daily. Use as instructed   icosapent Ethyl (VASCEPA) 1 g capsule Take 2 capsules (2 g total) by mouth 2 (two) times daily.   losartan (COZAAR) 100 MG tablet Take 1 tablet (100 mg total) by mouth daily.   montelukast (SINGULAIR) 10 MG tablet Take 1 tablet (10 mg total) by mouth at bedtime.   pantoprazole (PROTONIX) 40 MG tablet Take 1 tablet (40 mg total) by mouth daily.   PX Lancets MicroThin 33G MISC 1 each by Does not apply route 2 (two) times daily.   simvastatin (ZOCOR) 40 MG tablet Take 40 mg by mouth at bedtime.   rosuvastatin (CRESTOR) 20 MG tablet Take 1 tablet (20 mg total) by mouth daily. (Patient not taking: Reported on 02/20/2022)   No facility-administered encounter medications on file as of 02/20/2022.    Allergies (verified) Meloxicam and Rosuvastatin   History: Past Medical History:  Diagnosis Date   Allergy    Anemia    Anxiety    Arthralgia of left temporomandibular joint    Arthritis    Cervicalgia    Chronic kidney disease, stage 3a (HCC)    Depression    Diabetes mellitus  without complication ([place])    Hesitancy of micturition    Hyperlipidemia    Hypertension    Osteoporosis    Other psoriasis    Other specified menopausal and perimenopausal disorders    Overweight    Repeated falls    Residual hemorrhoidal skin tags    Past Surgical History:  Procedure Laterality Date   ABDOMINAL HYSTERECTOMY     APPENDECTOMY     CARPAL TUNNEL RELEASE Right    CHOLECYSTECTOMY     COLONOSCOPY  04/09/2016   Mild sigmoid diverticulosis. Otherwise normal colonoscopy   ESOPHAGOGASTRODUODENOSCOPY  09/10/2012   No obvious esophageal stricture, status post empiric esophageal dilation. Irregular GE junction,  questionable significance. Mild gastritis.   eye lid surgery     KNEE SURGERY Left    REPLACEMENT TOTAL KNEE Right 06/03/2021   Family History  Problem Relation Age of Onset   Diabetes type II Mother    Hyperlipidemia Mother    Hypertension Mother    Dementia Mother    Heart attack Father    Hyperlipidemia Father    Hypertension Father    Stroke Father    Diabetes type II Father    Colon cancer Daughter 32   Cancer Daughter    Esophageal cancer Neg Hx    Rectal cancer Neg Hx    Stomach cancer Neg Hx    Social History   Socioeconomic History   Marital status: Married    Spouse name: Joneen Boers   Number of children: 4   Years of education: Not on file   Highest education level: Not on file  Occupational History   Occupation: Homemaker  Tobacco Use   Smoking status: Never   Smokeless tobacco: Never  Vaping Use   Vaping Use: Never used  Substance and Sexual Activity   Alcohol use: Never   Drug use: Never   Sexual activity: Not on file  Other Topics Concern   Not on file  Social History Narrative   Trana spends a lot if time helping her daughter get to and from oncology appointments   Her husband has started drinking alcohol again   Social Determinants of Health   Financial Resource Strain: High Risk (01/14/2022)   Overall Financial Resource Strain (CARDIA)    Difficulty of Paying Living Expenses: Hard  Food Insecurity: No Food Insecurity (04/03/2020)   Hunger Vital Sign    Worried About Running Out of Food in the Last Year: Never true    [place] in the Last Year: Never true  Transportation Needs: No Transportation Needs (01/14/2022)   PRAPARE - Hydrologist (Medical): No    Lack of Transportation (Non-Medical): No  Physical Activity: Not on file  Stress: Stress Concern Present (09/06/2020)   [place]    Feeling of Stress : To some extent  Social Connections: Not  on file    Tobacco Counseling Counseling given: patient does not use tobacco products   Clinical Intake:  Pre-visit preparation completed: Yes Pain : No/denies pain Pain Score: 0-No pain   BMI - recorded: 27.78 Nutritional Status: BMI 25 -29 Overweight Nutritional Risks: None Diabetes: Yes (last A1C 7.6) CBG done?: No Did pt. bring in CBG monitor from home?: No  How often do you need to have someone help you when you read instructions, pamphlets, or other written materials from your doctor or pharmacy?: 1 - Never Interpreter Needed?: No     Activities  of Daily Living    02/20/2022    9:04 AM  In your present state of health, do you have any difficulty performing the following activities:  Hearing? 0  Vision? 0  Difficulty concentrating or making decisions? 0  Walking or climbing stairs? 0  Dressing or bathing? 0  Doing errands, shopping? 0  Preparing Food and eating ? N  Using the Toilet? N  In the past six months, have you accidently leaked urine? N  Do you have problems with loss of bowel control? N  Managing your Medications? N  Managing your Finances? N  Housekeeping or managing your Housekeeping? N    Patient Care Team: [name], MD as PCP - General (Family Medicine) Jackquline Denmark, MD as Consulting Physician (Gastroenterology) Joya Salm, MD as Referring Physician (Orthopedic Surgery) Vickey Huger, MD as Consulting Physician (Orthopedic Surgery) Lane Hacker, [facility] (Pharmacist)  Indicate any recent Medical Services you may have received from other than [facility] providers in the past year (date may be approximate). [facility]    Assessment:   This is a routine wellness examination for [place].  Hearing/Vision screen Patient had an eye exam yesterday, office note requested  Dietary issues and exercise activities discussed: Current Exercise Habits: Home exercise routine, Type of exercise: walking, Time (Minutes): 30, Frequency (Times/Week): 5,  Weekly Exercise (Minutes/Week): 150, Intensity: Mild, Exercise limited by: None identified  Depression Screen    02/20/2022    9:04 AM 01/14/2022    7:48 AM 07/06/2021   12:41 PM 03/29/2021    8:06 AM 12/21/2020    9:03 AM 09/14/2020    8:39 AM 09/06/2020    9:45 AM  PHQ 2/9 Scores  PHQ - 2 Score 0 0 0 1 0 4 0  PHQ- 9 Score '1 1 3 3  14     '$ Fall Risk    02/20/2022    9:04 AM 01/14/2022    7:47 AM 12/21/2020    9:03 AM 09/06/2020    9:45 AM 05/04/2020    7:58 AM  Fall Risk   Falls in the past year? 0 0 0 1 0  Number falls in past yr: 0 0 0 0 0  Injury with Fall? 0 0 0 1   Risk for fall due to : No Fall Risks No Fall Risks  History of fall(s)   Follow up Falls evaluation completed;Education provided;Falls prevention discussed Falls evaluation completed Falls evaluation completed Falls evaluation completed;Education provided;Falls prevention discussed     FALL RISK PREVENTION PERTAINING TO THE HOME:  Any stairs in or around the home? No  If so, are there any without handrails? No  Home free of loose throw rugs in walkways, pet beds, electrical cords, etc? Yes  Adequate lighting in your home to reduce risk of falls? Yes   ASSISTIVE DEVICES UTILIZED TO PREVENT FALLS:  Use of a cane, walker or w/c? No  Grab bars in the bathroom? Yes  Shower chair or bench in shower? Yes  Elevated toilet seat or a handicapped toilet? Yes   Gait steady and fast without use of assistive device  Cognitive Function:        02/20/2022    9:05 AM 09/06/2020   10:32 AM 08/16/2019   10:31 AM  6CIT Screen  What Year? 0 points 0 points 0 points  What month? 0 points 0 points 0 points  What time? 0 points 0 points 0 points  Count back from 20 0 points 0 points 0 points  Months in reverse 2 points 0 points 0 points  Repeat phrase 2 points 0 points 0 points  Total Score 4 points 0 points 0 points    Immunizations Immunization History  Administered Date(s) Administered   Fluad Quad(high Dose 65+) 02/08/2019,  03/29/2021   Influenza-Unspecified 02/27/2020   Moderna Sars-Covid-2 Vaccination 10/31/2019, 11/28/2019, 05/31/2020, 10/12/2020   PNEUMOCOCCAL CONJUGATE-20 11/22/2021   Pfizer Covid-19 Vaccine Bivalent Booster 89yr & up 03/29/2021   Pneumococcal Conjugate-13 05/13/2014   Pneumococcal Polysaccharide-23 01/29/2017   Tdap 05/03/2012, 01/31/2022   Zoster Recombinat (Shingrix) 06/11/2017   Zoster, Live 05/16/2010    TDAP status: Up to date  Flu Vaccine status: Due, Education has been provided regarding the importance of this vaccine. Advised may receive this vaccine at local pharmacy or Health Dept. Aware to provide a copy of the vaccination record if obtained from local pharmacy or Health Dept. Verbalized acceptance and understanding.  Pneumococcal vaccine status: Up to date  Covid-19 vaccine status: Completed vaccines  Qualifies for Shingles Vaccine? Yes   Zostavax completed No   Shingrix Completed?: No.    Education has been provided regarding the importance of this vaccine. Patient has been advised to call insurance company to determine out of pocket expense if they have not yet received this vaccine. Advised may also receive vaccine at local pharmacy or Health Dept. Verbalized acceptance and understanding.  Screening Tests Health Maintenance  Topic Date Due   Hepatitis C Screening  Never done   DEXA SCAN  07/21/2019   MAMMOGRAM  07/22/2019   OPHTHALMOLOGY EXAM  02/08/2021   COVID-19 Vaccine (6 - Moderna risk series) 05/24/2021   INFLUENZA VACCINE  03/30/2022 (Originally 01/14/2022)   HEMOGLOBIN A1C  07/17/2022   FOOT EXAM  01/15/2023   COLONOSCOPY (Pts 45-488yrInsurance coverage will need to be confirmed)  02/07/2031   TETANUS/TDAP  02/01/2032   Pneumonia Vaccine 6531Years old  Completed   HPV VACCINES  Aged Out   Zoster Vaccines- Shingrix  Discontinued    Health Maintenance  Health Maintenance Due  Topic Date Due   Hepatitis C Screening  Never done   DEXA SCAN   07/21/2019   MAMMOGRAM  07/22/2019   OPHTHALMOLOGY EXAM  02/08/2021   COVID-19 Vaccine (6 - Moderna risk series) 05/24/2021    Colorectal cancer screening: Type of screening: Colonoscopy. Completed 01/2021. Repeat every 10 years  Mammogram status: Ordered   Bone Density status: Ordered   Lung Cancer Screening: (Low Dose CT Chest recommended if Age 74-80ears, 30 pack-year currently smoking OR have quit w/in 15years.) does not qualify.   Additional Screening:  Vision Screening: Recommended annual ophthalmology exams for early detection of glaucoma and other disorders of the eye. Is the patient up to date with their annual eye exam?  Yes  Who is the provider or what is the name of the office in which the patient attends annual eye exams? [facility]: Recommended annual dental exams for proper oral hygiene  Community Resource Referral / Chronic Care Management: CRR required this visit?  No   CCM required this visit?  No      Plan:    1- [facility]- Flu Vaccine due - patient wants to wait until after her surgery next week 3- 2nd Shingrix Vaccine declined per patient 4- Discussed diet and exercise  I have personally reviewed and noted the following in the patient's chart:   Medical and social history  Use of alcohol, tobacco or illicit drugs  Current medications and supplements including opioid prescriptions. Patient is not currently taking opioid prescriptions. Functional ability and status Nutritional status Physical activity Advanced directives List of other physicians Hospitalizations, surgeries, and ER visits in previous 12 months Vitals Screenings to include cognitive, depression, and falls Referrals and appointments  In addition, I have reviewed and discussed with patient certain preventive protocols, quality metrics, and best practice recommendations. A written personalized care plan for  preventive services as well as general preventive health recommendations were provided to patient.     Erie Noe, LPN   12/17/7503

## 2022-02-20 NOTE — Patient Instructions (Signed)
Alicia Jordan , Thank you for taking time to come for your Medicare Wellness Visit. I appreciate your ongoing commitment to your health goals. Please review the following plan we discussed and let me know if I can assist you in the future.   Screening recommendations/referrals: Colonoscopy: Due 2032 Mammogram: Ordered Bone Density: Ordered Recommended yearly ophthalmology/optometry visit for glaucoma screening and checkup Recommended yearly dental visit for hygiene and checkup  Vaccinations: Influenza vaccine: Due - we have these at the office Pneumococcal vaccine: Complete Tdap vaccine: Due 2033 Shingles vaccine: 2nd Shingrix Due - declined by patient    Advanced directives: on file    Preventive Care 19 Years and Older, Female   Preventive care refers to lifestyle choices and visits with your health care provider that can promote health and wellness.  What does preventive care include? A yearly physical exam. This is also called an annual well check. Dental exams once or twice a year. Routine eye exams. Ask your health care provider how often you should have your eyes checked. Personal lifestyle choices, including: Daily care of your teeth and gums. Regular physical activity. Eating a healthy diet. Avoiding tobacco and drug use. Limiting alcohol use. Practicing safe sex. Taking low-dose aspirin every day. Taking vitamin and mineral supplements as recommended by your health care provider.  What happens during an annual well check? The services and screenings done by your health care provider during your annual well check will depend on your age, overall health, lifestyle risk factors, and family history of disease.  Counseling Your health care provider may ask you questions about your: Alcohol use. Tobacco use. Drug use. Emotional well-being. Home and relationship well-being. Sexual activity. Eating habits. History of falls. Memory and ability to understand  (cognition). Work and work Statistician. Reproductive health.  Screening You may have the following tests or measurements: Height, weight, and BMI. Blood pressure. Lipid and cholesterol levels. These may be checked every 5 years, or more frequently if you are over 51 years old. Skin check. Lung cancer screening. You may have this screening every year starting at age 28 if you have a 30-pack-year history of smoking and currently smoke or have quit within the past 15 years. Fecal occult blood test (FOBT) of the stool. You may have this test every year starting at age 31. Flexible sigmoidoscopy or colonoscopy. You may have a sigmoidoscopy every 5 years or a colonoscopy every 10 years starting at age 84. Hepatitis C blood test. Hepatitis B blood test. Sexually transmitted disease (STD) testing. Diabetes screening. This is done by checking your blood sugar (glucose) after you have not eaten for a while (fasting). You may have this done every 1-3 years. Bone density scan. This is done to screen for osteoporosis. You may have this done starting at age 27. Mammogram. This may be done every 1-2 years. Talk to your health care provider about how often you should have regular mammograms. Talk with your health care provider about your test results, treatment options, and if necessary, the need for more tests.  Vaccines Your health care provider may recommend certain vaccines, such as: Influenza vaccine. This is recommended every year. Tetanus, diphtheria, and acellular pertussis (Tdap, Td) vaccine. You may need a Td booster every 10 years. Zoster vaccine. You may need this after age 22. Pneumococcal 13-valent conjugate (PCV13) vaccine. One dose is recommended after age 37. Pneumococcal polysaccharide (PPSV23) vaccine. One dose is recommended after age 36. Talk to your health care provider about which screenings and  vaccines you need and how often you need them.  This information is not intended to  replace advice given to you by your health care provider. Make sure you discuss any questions you have with your health care provider. Document Released: 06/29/2015 Document Revised: 02/20/2016 Document Reviewed: 04/03/2015 Elsevier Interactive Patient Education  2017 [place] Prevention in the Home  Falls can cause injuries. They can happen to people of all ages. There are many things you can do to make your home safe and to help prevent falls.  What can I do on the outside of my home? Regularly fix the edges of walkways and driveways and fix any cracks. Remove anything that might make you trip as you walk through a door, such as a raised step or threshold. Trim any bushes or trees on the path to your home. Use bright outdoor lighting. Clear any walking paths of anything that might make someone trip, such as rocks or tools. Regularly check to see if handrails are loose or broken. Make sure that both sides of any steps have handrails. Any raised decks and porches should have guardrails on the edges. Have any leaves, snow, or ice cleared regularly. Use sand or salt on walking paths during winter. Clean up any spills in your garage right away. This includes oil or grease spills.  What can I do in the bathroom? Use night lights. Install grab bars by the toilet and in the tub and shower. Do not use towel bars as grab bars. Use non-skid mats or decals in the tub or shower. If you need to sit down in the shower, use a plastic, non-slip stool. Keep the floor dry. Clean up any water that spills on the floor as soon as it happens. Remove soap buildup in the tub or shower regularly. Attach bath mats securely with double-sided non-slip rug tape. Do not have throw rugs and other things on the floor that can make you trip.  What can I do in the bedroom? Use night lights. Make sure that you have a light by your bed that is easy to reach. Do not use any sheets or blankets that are too  big for your bed. They should not hang down onto the floor. Have a firm chair that has side arms. You can use this for support while you get dressed. Do not have throw rugs and other things on the floor that can make you trip.  What can I do in the kitchen? Clean up any spills right away. Avoid walking on wet floors. Keep items that you use a lot in easy-to-reach places. If you need to reach something above you, use a strong step stool that has a grab bar. Keep electrical cords out of the way. Do not use floor polish or wax that makes floors slippery. If you must use wax, use non-skid floor wax. Do not have throw rugs and other things on the floor that can make you trip.  What can I do with my stairs? Do not leave any items on the stairs. Make sure that there are handrails on both sides of the stairs and use them. Fix handrails that are broken or loose. Make sure that handrails are as long as the stairways. Check any carpeting to make sure that it is firmly attached to the stairs. Fix any carpet that is loose or worn. Avoid having throw rugs at the top or bottom of the stairs. If you do have throw rugs, attach  them to the floor with carpet tape. Make sure that you have a light switch at the top of the stairs and the bottom of the stairs. If you do not have them, ask someone to add them for you.  What else can I do to help prevent falls? Wear shoes that: Do not have high heels. Have rubber bottoms. Are comfortable and fit you well. Are closed at the toe. Do not wear sandals. If you use a stepladder: Make sure that it is fully opened. Do not climb a closed stepladder. Make sure that both sides of the stepladder are locked into place. Ask someone to hold it for you, if possible. Clearly mark and make sure that you can see: Any grab bars or handrails. First and last steps. Where the edge of each step is. Use tools that help you move around (mobility aids) if they are needed. These  include: Canes. Walkers. Scooters. Crutches. Turn on the lights when you go into a dark area. Replace any light bulbs as soon as they burn out. Set up your furniture so you have a clear path. Avoid moving your furniture around. If any of your floors are uneven, fix them. If there are any pets around you, be aware of where they are. Review your medicines with your doctor. Some medicines can make you feel dizzy. This can increase your chance of falling. Ask your doctor what other things that you can do to help prevent falls.  This information is not intended to replace advice given to you by your health care provider. Make sure you discuss any questions you have with your health care provider. Document Released: 03/29/2009 Document Revised: 11/08/2015 Document Reviewed: 07/07/2014 Elsevier Interactive Patient Education  2017 [employer].

## 2022-02-25 DIAGNOSIS — G5602 Carpal tunnel syndrome, left upper limb: Secondary | ICD-10-CM | POA: Diagnosis not present

## 2022-02-25 DIAGNOSIS — M1812 Unilateral primary osteoarthritis of first carpometacarpal joint, left hand: Secondary | ICD-10-CM | POA: Diagnosis not present

## 2022-02-26 ENCOUNTER — Encounter: Payer: Self-pay | Admitting: Family Medicine

## 2022-03-03 ENCOUNTER — Telehealth: Payer: Self-pay

## 2022-03-03 NOTE — Chronic Care Management (AMB) (Signed)
AZ&Me Notification:  Wilder Glade 10 mg was shipped on 02/14/2022, allow 1-2 business days for shipment to arrive. Shipment was sent via [employer], Tracking number [id number].  Pattricia Boss, [place] Pharmacist Assistant [phone] '

## 2022-03-12 DIAGNOSIS — Z4789 Encounter for other orthopedic aftercare: Secondary | ICD-10-CM | POA: Diagnosis not present

## 2022-03-12 DIAGNOSIS — M1812 Unilateral primary osteoarthritis of first carpometacarpal joint, left hand: Secondary | ICD-10-CM | POA: Diagnosis not present

## 2022-03-12 DIAGNOSIS — G5602 Carpal tunnel syndrome, left upper limb: Secondary | ICD-10-CM | POA: Diagnosis not present

## 2022-03-12 DIAGNOSIS — M79642 Pain in left hand: Secondary | ICD-10-CM | POA: Diagnosis not present

## 2022-03-24 ENCOUNTER — Other Ambulatory Visit: Payer: Self-pay | Admitting: Family Medicine

## 2022-03-26 DIAGNOSIS — M1812 Unilateral primary osteoarthritis of first carpometacarpal joint, left hand: Secondary | ICD-10-CM | POA: Diagnosis not present

## 2022-03-26 DIAGNOSIS — M79645 Pain in left finger(s): Secondary | ICD-10-CM | POA: Diagnosis not present

## 2022-03-26 DIAGNOSIS — G5602 Carpal tunnel syndrome, left upper limb: Secondary | ICD-10-CM | POA: Diagnosis not present

## 2022-03-26 DIAGNOSIS — Z4789 Encounter for other orthopedic aftercare: Secondary | ICD-10-CM | POA: Diagnosis not present

## 2022-03-26 DIAGNOSIS — M79642 Pain in left hand: Secondary | ICD-10-CM | POA: Diagnosis not present

## 2022-03-27 ENCOUNTER — Other Ambulatory Visit: Payer: Self-pay

## 2022-03-27 MED ORDER — SIMVASTATIN 40 MG PO TABS: 40.0000 mg | ORAL_TABLET | Freq: Every day | ORAL | 0 refills | Status: DC

## 2022-04-04 DIAGNOSIS — M79645 Pain in left finger(s): Secondary | ICD-10-CM | POA: Diagnosis not present

## 2022-04-07 ENCOUNTER — Telehealth: Payer: Self-pay

## 2022-04-07 NOTE — Chronic Care Management (AMB) (Cosign Needed Addendum)
Chronic Care Management Pharmacy Assistant   Name: Alicia Jordan  MRN: [id number] DOB: [birth year]  Reason for Encounter: Medication Review/ Medication coordination  Recent office visits:  02-20-2022 Erie Noe, LPN. Mammogram screening.  Recent consult visits:  03-26-2022 Barbara Cower, OT. Therapy on left thumb.  03-12-2022 Roseanne Kaufman, MD. Follow up orthopedic assessment.  02-19-2022 Alen Blew (Optometry). Unable to view encounter.   02-13-2022 Roseanne Kaufman, MD. Follow up.   01-22-2022 [place], Darin (Chiropractic Medicine). Unable to view encounter.  Hospital visits:  None in previous 6 months  Medications: Outpatient Encounter Medications as of 04/07/2022  Medication Sig   Acetaminophen (TYLENOL PO) Take by mouth as needed.   Cetirizine HCl (ZYRTEC ALLERGY) 10 MG CAPS Take 1 capsule (10 mg total) by mouth daily.   Cholecalciferol (VITAMIN D3) 25 MCG (1000 UT) CAPS Take 1 capsule (1,000 Units total) by mouth daily.   dapagliflozin propanediol (FARXIGA) 10 MG TABS tablet Take 1 tablet (10 mg total) by mouth daily before breakfast.   Ferrous Sulfate (IRON PO) Take 2 tablets by mouth daily in the afternoon.   FLUoxetine (PROZAC) 20 MG capsule Take 2 capsules (40 mg total) by mouth daily.   glucose blood test strip 1 each by Other route 2 (two) times daily. Use as instructed   icosapent Ethyl (VASCEPA) 1 g capsule Take 2 capsules (2 g total) by mouth 2 (two) times daily.   losartan (COZAAR) 100 MG tablet Take 1 tablet (100 mg total) by mouth daily.   montelukast (SINGULAIR) 10 MG tablet Take 1 tablet (10 mg total) by mouth at bedtime.   pantoprazole (PROTONIX) 40 MG tablet Take 1 tablet (40 mg total) by mouth daily.   PX Lancets MicroThin 33G MISC 1 each by Does not apply route 2 (two) times daily.   simvastatin (ZOCOR) 40 MG tablet Take 1 tablet (40 mg total) by mouth at bedtime.   No facility-administered encounter medications on file as of  04/07/2022.   Reviewed chart for medication changes ahead of medication coordination call.  No hospital visits since last care coordination call/Pharmacist visit.   No medication changes indicated   BP Readings from Last 3 Encounters:  02/20/22 130/78  01/14/22 130/68  10/03/21 116/60    Lab Results  Component Value Date   HGBA1C 7.6 (H) 01/14/2022     Patient obtains medications through Adherence Packaging  90 Days   Last adherence delivery included:  Ferrous Sulfate '325mg'$  2 at L Vitamin D3 59mg 1 at B Rosuvastatin '20mg'$  1 at B Losartan '100mg'$  1 at EM Fluoxetine '20mg'$  2 at B Pantoprazole '40mg'$  1 at B Cetirizine '10mg'$  1 at B Vascepa 1gm 2 at B and 2 at EM  Patient declined (meds) last delivery: Farxiga Gets PAP Test Strips/Lancets- Pt has plenty of supply, does not need  Patient is due for next adherence delivery on: 04-17-2022  Called patient and reviewed medications and coordinated delivery.  This delivery to include: Ferrous Sulfate '325mg'$  2 at L Vitamin D3 261m 1 at B Losartan '100mg'$  1 at EM Fluoxetine '20mg'$  2 at B Pantoprazole '40mg'$  1 at B Cetirizine '10mg'$  1 at B Vascepa 1gm 2 at B and 2 at EM   No acute/short fill needed  Patient declined the following medications: Farxiga Gets PAP Test Strips/Lancets- Pt has plenty of supply, does not need Simvastatin- Gets from [facility] Montelukast- Gets from [facility] Tylenol- OTC Rosuvastatin- D/C  Patient needs refills for: None  Confirmed delivery date of 04-17-2022 advised  patient that pharmacy will contact them the morning of delivery.  Care Gaps: Hep C screening overdue Covid booster overdue Flu vaccine overdue  Star Rating Drugs: Losartan 100 mg- Last filled 01-10-2022 [facility] Farxiga 10 mg- PAP Simvastatin 40 mg- Last filled 03-27-2022 90 DS [place] Clinical Pharmacist Assistant [phone]

## 2022-04-08 DIAGNOSIS — M79645 Pain in left finger(s): Secondary | ICD-10-CM | POA: Diagnosis not present

## 2022-04-09 NOTE — Telephone Encounter (Signed)
Compliant on meds 

## 2022-04-15 DIAGNOSIS — M79645 Pain in left finger(s): Secondary | ICD-10-CM | POA: Diagnosis not present

## 2022-04-21 DIAGNOSIS — Z1159 Encounter for screening for other viral diseases: Secondary | ICD-10-CM | POA: Insufficient documentation

## 2022-04-21 NOTE — Assessment & Plan Note (Signed)
Control:  Recommend check sugars fasting daily. Recommend check feet daily. Recommend annual eye exams. Medicines:  Continue to work on eating a healthy diet and exercise.  Labs drawn today.    

## 2022-04-21 NOTE — Assessment & Plan Note (Signed)
Check D.

## 2022-04-21 NOTE — Progress Notes (Unsigned)
Subjective:  Patient ID: Alicia Jordan, female    DOB: 03-25-1946  Age: 76 y.o. MRN: [id number]  No chief complaint on file.   HPI  Diabetes:   Checks sugars 2-3 times per week Log: 160s. Checking feet daily.  Eye exam scheduled with [facility] in September 2023.  No polydipsia, no polyphagia, no polyuria.  Medications: Farxiga 10 mg daily. Rybelsus was too expensive. She was unable to get it.   Hyperlipidemia:  Taking Rosuvastatin 20 mg daily, Vascepa 2 grams twice daily.  Hypertension:  Losartan 100 mg daily.    Depression/Anxiety: on prozac 20 mg 2 daily.     01/14/2022    7:48 AM 07/06/2021   12:41 PM 03/29/2021    8:06 AM  PHQ9 SCORE ONLY  PHQ-9 Total Score '1 3 3    '$ GERD: on protonix 40 mg once daily. Has to take mylanta 1-2 times every 3 weeks.   Allergic rhinitis. ON zyrtec and montelukast. Has tried claritin, allegra, and benadryl.   Current Outpatient Medications on File Prior to Visit  Medication Sig Dispense Refill   Acetaminophen (TYLENOL PO) Take by mouth as needed.     Cetirizine HCl (ZYRTEC ALLERGY) 10 MG CAPS Take 1 capsule (10 mg total) by mouth daily. 90 capsule 3   Cholecalciferol (VITAMIN D3) 25 MCG (1000 UT) CAPS Take 1 capsule (1,000 Units total) by mouth daily. 180 capsule 3   dapagliflozin propanediol (FARXIGA) 10 MG TABS tablet Take 1 tablet (10 mg total) by mouth daily before breakfast. 90 tablet 3   Ferrous Sulfate (IRON PO) Take 2 tablets by mouth daily in the afternoon.     FLUoxetine (PROZAC) 20 MG capsule Take 2 capsules (40 mg total) by mouth daily. 180 capsule 1   glucose blood test strip 1 each by Other route 2 (two) times daily. Use as instructed 100 each 2   icosapent Ethyl (VASCEPA) 1 g capsule Take 2 capsules (2 g total) by mouth 2 (two) times daily. 360 capsule 3   losartan (COZAAR) 100 MG tablet Take 1 tablet (100 mg total) by mouth daily. 90 tablet 1   montelukast (SINGULAIR) 10 MG tablet Take 1 tablet (10 mg total) by mouth at  bedtime. 90 tablet 3   pantoprazole (PROTONIX) 40 MG tablet Take 1 tablet (40 mg total) by mouth daily. 90 tablet 3   PX Lancets MicroThin 33G MISC 1 each by Does not apply route 2 (two) times daily. 100 each 2   simvastatin (ZOCOR) 40 MG tablet Take 1 tablet (40 mg total) by mouth at bedtime. 90 tablet 0   No current facility-administered medications on file prior to visit.   Past Medical History:  Diagnosis Date   Allergy    Anemia    Anxiety    Arthralgia of left temporomandibular joint    Arthritis    Cervicalgia    Chronic kidney disease, stage 3a (HCC)    Depression    Diabetes mellitus without complication ([place])    Hesitancy of micturition    Hyperlipidemia    Hypertension    Osteoporosis    Other psoriasis    Other specified menopausal and perimenopausal disorders    Overweight    Repeated falls    Residual hemorrhoidal skin tags    Past Surgical History:  Procedure Laterality Date   ABDOMINAL HYSTERECTOMY     APPENDECTOMY     CARPAL TUNNEL RELEASE Right    CHOLECYSTECTOMY     COLONOSCOPY  04/09/2016  Mild sigmoid diverticulosis. Otherwise normal colonoscopy   ESOPHAGOGASTRODUODENOSCOPY  09/10/2012   No obvious esophageal stricture, status post empiric esophageal dilation. Irregular GE junction, questionable significance. Mild gastritis.   eye lid surgery     KNEE SURGERY Left    REPLACEMENT TOTAL KNEE Right 06/03/2021    Family History  Problem Relation Age of Onset   Diabetes type II Mother    Hyperlipidemia Mother    Hypertension Mother    Dementia Mother    Heart attack Father    Hyperlipidemia Father    Hypertension Father    Stroke Father    Diabetes type II Father    Colon cancer Daughter 36   Cancer Daughter    Esophageal cancer Neg Hx    Rectal cancer Neg Hx    Stomach cancer Neg Hx    Social History   Socioeconomic History   Marital status: Married    Spouse name: Joneen Boers   Number of children: 4   Years of education: Not on file    Highest education level: Not on file  Occupational History   Occupation: Homemaker  Tobacco Use   Smoking status: Never   Smokeless tobacco: Never  Vaping Use   Vaping Use: Never used  Substance and Sexual Activity   Alcohol use: Never   Drug use: Never   Sexual activity: Not on file  Other Topics Concern   Not on file  Social History Narrative   Anniebell spends a lot if time helping her daughter get to and from oncology appointments   Her husband has started drinking alcohol again   Social Determinants of Health   Financial Resource Strain: High Risk (01/14/2022)   Overall Financial Resource Strain (CARDIA)    Difficulty of Paying Living Expenses: Hard  Food Insecurity: No Food Insecurity (04/03/2020)   Hunger Vital Sign    Worried About Running Out of Food in the Last Year: Never true    [place] in the Last Year: Never true  Transportation Needs: No Transportation Needs (01/14/2022)   PRAPARE - Hydrologist (Medical): No    Lack of Transportation (Non-Medical): No  Physical Activity: Not on file  Stress: Stress Concern Present (09/06/2020)   [place]    Feeling of Stress : To some extent  Social Connections: Not on file    Review of Systems  Constitutional:  Negative for chills, fatigue and fever.  HENT:  Negative for congestion, ear pain and sore throat.   Respiratory:  Negative for cough and shortness of breath.   Cardiovascular:  Negative for chest pain.  Gastrointestinal:  Negative for abdominal pain, constipation, diarrhea, nausea and vomiting.  Genitourinary:  Negative for dysuria and urgency.  Musculoskeletal:  Negative for arthralgias and myalgias.  Skin:  Negative for rash.  Neurological:  Negative for dizziness and headaches.  Psychiatric/Behavioral:  Negative for dysphoric mood. The patient is not nervous/anxious.      Objective:  There were no vitals taken  for this visit.     02/20/2022    8:36 AM 01/14/2022    7:45 AM 10/03/2021    8:19 AM  BP/Weight  Systolic BP 672 094 709  Diastolic BP 78 68 60  Wt. (Lbs) 147 147 144.2  BMI 27.78 kg/m2 27.78 kg/m2 27.25 kg/m2    Physical Exam Vitals reviewed.  Constitutional:      Appearance: Normal appearance. She is normal weight.  Neck:  Vascular: No carotid bruit.  Cardiovascular:     Rate and Rhythm: Normal rate and regular rhythm.     Heart sounds: Normal heart sounds.  Pulmonary:     Effort: Pulmonary effort is normal. No respiratory distress.     Breath sounds: Normal breath sounds.  Abdominal:     General: Abdomen is flat. Bowel sounds are normal.     Palpations: Abdomen is soft.     Tenderness: There is no abdominal tenderness.  Neurological:     Mental Status: She is alert and oriented to person, place, and time.  Psychiatric:        Mood and Affect: Mood normal.        Behavior: Behavior normal.     Diabetic Foot Exam - Simple   No data filed      Lab Results  Component Value Date   WBC 5.5 01/14/2022   HGB 13.4 01/14/2022   HCT 40.2 01/14/2022   PLT 161 01/14/2022   GLUCOSE 173 (H) 01/14/2022   CHOL 149 01/14/2022   TRIG 156 (H) 01/14/2022   HDL 44 01/14/2022   LDLCALC 78 01/14/2022   ALT 14 01/14/2022   AST 18 01/14/2022   NA 141 01/14/2022   K 4.2 01/14/2022   CL 104 01/14/2022   CREATININE 1.44 (H) 01/14/2022   BUN 24 01/14/2022   CO2 22 01/14/2022   TSH 1.890 07/04/2021   INR 1.0 12/14/2020   HGBA1C 7.6 (H) 01/14/2022   MICROALBUR 30 03/29/2021      Assessment & Plan:   Problem List Items Addressed This Visit       Digestive   GERD without esophagitis    The current medical regimen is effective;  continue present plan and medications.         Endocrine   Dyslipidemia associated with type 2 diabetes mellitus ([place])    Control:  Recommend check sugars fasting daily. Recommend check feet daily. Recommend annual eye exams. Medicines:   Continue to work on eating a healthy diet and exercise.  Labs drawn today.         Diabetic glomerulopathy (HCC)    Control:  Recommend check sugars fasting daily. Recommend check feet daily. Recommend annual eye exams. Medicines:  Continue to work on eating a healthy diet and exercise.  Labs drawn today.           Other   Iron deficiency anemia - Primary (Chronic)    Check cbc      B12 deficiency (Chronic)    Check b12.      Mixed hyperlipidemia    Well controlled.  No changes to medicines.  Continue to work on eating a healthy diet and exercise.  Labs drawn today.        Vitamin D deficiency    Check D.      Mild recurrent major depression (HCC)    The current medical regimen is effective;  continue present plan and medications. Continue prozac 20 mg 2 daily.       Other Visit Diagnoses     Need for hepatitis C screening test         .  No orders of the defined types were placed in this encounter.   No orders of the defined types were placed in this encounter.    Follow-up: No follow-ups on file.  An After Visit Summary was printed and given to the patient.  [name], MD [facility] [phone]

## 2022-04-21 NOTE — Assessment & Plan Note (Signed)
Check b12.

## 2022-04-21 NOTE — Assessment & Plan Note (Signed)
Check cbc 

## 2022-04-21 NOTE — Assessment & Plan Note (Signed)
The current medical regimen is effective;  continue present plan and medications. Continue prozac 20 mg 2 daily. 

## 2022-04-21 NOTE — Assessment & Plan Note (Signed)
The current medical regimen is effective;  continue present plan and medications.  

## 2022-04-21 NOTE — Assessment & Plan Note (Signed)
Well controlled.  ?No changes to medicines.  ?Continue to work on eating a healthy diet and exercise.  ?Labs drawn today.  ?

## 2022-04-22 ENCOUNTER — Encounter: Payer: Self-pay | Admitting: Family Medicine

## 2022-04-22 ENCOUNTER — Ambulatory Visit (INDEPENDENT_AMBULATORY_CARE_PROVIDER_SITE_OTHER): Payer: PPO | Admitting: Family Medicine

## 2022-04-22 VITALS — BP 118/62 | HR 75 | Temp 97.3°F | Resp 16 | Ht 61.0 in | Wt 145.2 lb

## 2022-04-22 DIAGNOSIS — D508 Other iron deficiency anemias: Secondary | ICD-10-CM | POA: Diagnosis not present

## 2022-04-22 DIAGNOSIS — Z1159 Encounter for screening for other viral diseases: Secondary | ICD-10-CM | POA: Diagnosis not present

## 2022-04-22 DIAGNOSIS — E1121 Type 2 diabetes mellitus with diabetic nephropathy: Secondary | ICD-10-CM

## 2022-04-22 DIAGNOSIS — E782 Mixed hyperlipidemia: Secondary | ICD-10-CM

## 2022-04-22 DIAGNOSIS — F330 Major depressive disorder, recurrent, mild: Secondary | ICD-10-CM | POA: Diagnosis not present

## 2022-04-22 DIAGNOSIS — E559 Vitamin D deficiency, unspecified: Secondary | ICD-10-CM

## 2022-04-22 DIAGNOSIS — E785 Hyperlipidemia, unspecified: Secondary | ICD-10-CM | POA: Diagnosis not present

## 2022-04-22 DIAGNOSIS — E538 Deficiency of other specified B group vitamins: Secondary | ICD-10-CM

## 2022-04-22 DIAGNOSIS — E1169 Type 2 diabetes mellitus with other specified complication: Secondary | ICD-10-CM | POA: Diagnosis not present

## 2022-04-22 DIAGNOSIS — K219 Gastro-esophageal reflux disease without esophagitis: Secondary | ICD-10-CM | POA: Diagnosis not present

## 2022-04-22 DIAGNOSIS — Z23 Encounter for immunization: Secondary | ICD-10-CM | POA: Diagnosis not present

## 2022-04-23 DIAGNOSIS — G5602 Carpal tunnel syndrome, left upper limb: Secondary | ICD-10-CM | POA: Diagnosis not present

## 2022-04-23 DIAGNOSIS — M1812 Unilateral primary osteoarthritis of first carpometacarpal joint, left hand: Secondary | ICD-10-CM | POA: Diagnosis not present

## 2022-04-23 DIAGNOSIS — M79645 Pain in left finger(s): Secondary | ICD-10-CM | POA: Diagnosis not present

## 2022-04-23 DIAGNOSIS — Z4789 Encounter for other orthopedic aftercare: Secondary | ICD-10-CM | POA: Diagnosis not present

## 2022-04-23 DIAGNOSIS — M79642 Pain in left hand: Secondary | ICD-10-CM | POA: Diagnosis not present

## 2022-04-30 LAB — HEMOGLOBIN A1C
Est. average glucose Bld gHb Est-mCnc: 171 mg/dL
Hgb A1c MFr Bld: 7.6 % — ABNORMAL HIGH (ref 4.8–5.6)

## 2022-04-30 LAB — IRON,TIBC AND FERRITIN PANEL
Ferritin: 253 ng/mL — ABNORMAL HIGH (ref 15–150)
Iron Saturation: 31 % (ref 15–55)
Iron: 96 ug/dL (ref 27–139)
Total Iron Binding Capacity: 311 ug/dL (ref 250–450)
UIBC: 215 ug/dL (ref 118–369)

## 2022-04-30 LAB — COMPREHENSIVE METABOLIC PANEL
ALT: 11 IU/L (ref 0–32)
AST: 15 IU/L (ref 0–40)
Albumin/Globulin Ratio: 2.3 — ABNORMAL HIGH (ref 1.2–2.2)
Albumin: 4.9 g/dL — ABNORMAL HIGH (ref 3.8–4.8)
Alkaline Phosphatase: 72 IU/L (ref 44–121)
BUN/Creatinine Ratio: 16 (ref 12–28)
BUN: 21 mg/dL (ref 8–27)
Bilirubin Total: 0.9 mg/dL (ref 0.0–1.2)
CO2: 24 mmol/L (ref 20–29)
Calcium: 10.1 mg/dL (ref 8.7–10.3)
Chloride: 102 mmol/L (ref 96–106)
Creatinine, Ser: 1.28 mg/dL — ABNORMAL HIGH (ref 0.57–1.00)
Globulin, Total: 2.1 g/dL (ref 1.5–4.5)
Glucose: 180 mg/dL — ABNORMAL HIGH (ref 70–99)
Potassium: 4.3 mmol/L (ref 3.5–5.2)
Sodium: 142 mmol/L (ref 134–144)
Total Protein: 7 g/dL (ref 6.0–8.5)
eGFR: 44 mL/min/{1.73_m2} — ABNORMAL LOW (ref >59)

## 2022-04-30 LAB — VITAMIN D 25 HYDROXY (VIT D DEFICIENCY, FRACTURES): Vit D, 25-Hydroxy: 28.3 ng/mL — ABNORMAL LOW (ref 30.0–100.0)

## 2022-04-30 LAB — HCV AB W REFLEX TO QUANT PCR: HCV Ab: NONREACTIVE

## 2022-04-30 LAB — CBC WITH DIFFERENTIAL/PLATELET
Basophils Absolute: 0 10*3/uL (ref 0.0–0.2)
Basos: 1 %
EOS (ABSOLUTE): 0.2 10*3/uL (ref 0.0–0.4)
Eos: 3 %
Hematocrit: 43.1 % (ref 34.0–46.6)
Hemoglobin: 14.1 g/dL (ref 11.1–15.9)
Immature Grans (Abs): 0 10*3/uL (ref 0.0–0.1)
Immature Granulocytes: 0 %
Lymphocytes Absolute: 1.3 10*3/uL (ref 0.7–3.1)
Lymphs: 24 %
MCH: 30.4 pg (ref 26.6–33.0)
MCHC: 32.7 g/dL (ref 31.5–35.7)
MCV: 93 fL (ref 79–97)
Monocytes Absolute: 0.2 10*3/uL (ref 0.1–0.9)
Monocytes: 4 %
Neutrophils Absolute: 3.5 10*3/uL (ref 1.4–7.0)
Neutrophils: 68 %
Platelets: 176 10*3/uL (ref 150–450)
RBC: 4.64 x10E6/uL (ref 3.77–5.28)
RDW: 13.8 % (ref 11.7–15.4)
WBC: 5.2 10*3/uL (ref 3.4–10.8)

## 2022-04-30 LAB — METHYLMALONIC ACID, SERUM: Methylmalonic Acid: 622 nmol/L — ABNORMAL HIGH (ref 0–378)

## 2022-04-30 LAB — LIPID PANEL
Chol/HDL Ratio: 4.3 ratio (ref 0.0–4.4)
Cholesterol, Total: 183 mg/dL (ref 100–199)
HDL: 43 mg/dL (ref >39)
LDL Chol Calc (NIH): 108 mg/dL — ABNORMAL HIGH (ref 0–99)
Triglycerides: 182 mg/dL — ABNORMAL HIGH (ref 0–149)
VLDL Cholesterol Cal: 32 mg/dL (ref 5–40)

## 2022-04-30 LAB — HCV INTERPRETATION

## 2022-04-30 LAB — B12 AND FOLATE PANEL
Folate: >20 ng/mL (ref >3.0)
Vitamin B-12: 365 pg/mL (ref 232–1245)

## 2022-04-30 LAB — TSH: TSH: 2.83 u[IU]/mL (ref 0.450–4.500)

## 2022-04-30 LAB — CARDIOVASCULAR RISK ASSESSMENT

## 2022-05-06 ENCOUNTER — Other Ambulatory Visit: Payer: Self-pay

## 2022-05-06 MED ORDER — VITAMIN D (ERGOCALCIFEROL) 1.25 MG (50000 UNIT) PO CAPS: 50000.0000 [IU] | ORAL_CAPSULE | ORAL | 2 refills | Status: DC

## 2022-05-06 MED ORDER — EZETIMIBE 10 MG PO TABS: 10.0000 mg | ORAL_TABLET | Freq: Every day | ORAL | 1 refills | Status: DC

## 2022-05-14 ENCOUNTER — Ambulatory Visit (INDEPENDENT_AMBULATORY_CARE_PROVIDER_SITE_OTHER): Payer: PPO | Admitting: Family Medicine

## 2022-05-14 VITALS — BP 140/60 | HR 84 | Temp 96.8°F | Resp 16 | Ht 61.0 in | Wt 145.0 lb

## 2022-05-14 DIAGNOSIS — J0100 Acute maxillary sinusitis, unspecified: Secondary | ICD-10-CM

## 2022-05-14 DIAGNOSIS — R051 Acute cough: Secondary | ICD-10-CM | POA: Diagnosis not present

## 2022-05-14 LAB — POCT INFLUENZA A/B
Influenza A, POC: NEGATIVE
Influenza B, POC: NEGATIVE

## 2022-05-14 LAB — POC COVID19 BINAXNOW: SARS Coronavirus 2 Ag: NEGATIVE

## 2022-05-14 MED ORDER — AMOXICILLIN-POT CLAVULANATE 875-125 MG PO TABS: 1.0000 | ORAL_TABLET | Freq: Two times a day (BID) | ORAL | 0 refills | Status: DC

## 2022-05-14 NOTE — Progress Notes (Signed)
Acute Office Visit  Subjective:    Patient ID: Alicia Jordan, female    DOB: 05/19/1946, 76 y.o.   MRN: [id number]  Chief Complaint  Patient presents with   Cough   HPI: Patient came to the clinic today for cough and chest congestion which started about 1 week ago.  She denies fever or chills but has been very fatigued.Cough is constant, non productive, worse at night. Patient is taking OTC cough syrup , mucinex, and benadryl with no effective. Patient also complaints post nasal drip, sinus pressure and congestion since past 7-10 days. Patient is drinking a lot of water to ease her pain but nothing seems helping.   Past Medical History:  Diagnosis Date   Allergy    Anemia    Anxiety    Arthralgia of left temporomandibular joint    Arthritis    Cervicalgia    Chronic kidney disease, stage 3a (HCC)    Depression    Diabetes mellitus without complication ([place])    Hesitancy of micturition    Hyperlipidemia    Hypertension    Osteoporosis    Other psoriasis    Other specified menopausal and perimenopausal disorders    Overweight    Repeated falls    Residual hemorrhoidal skin tags     Past Surgical History:  Procedure Laterality Date   ABDOMINAL HYSTERECTOMY     APPENDECTOMY     CARPAL TUNNEL RELEASE Right    CHOLECYSTECTOMY     COLONOSCOPY  04/09/2016   Mild sigmoid diverticulosis. Otherwise normal colonoscopy   ESOPHAGOGASTRODUODENOSCOPY  09/10/2012   No obvious esophageal stricture, status post empiric esophageal dilation. Irregular GE junction, questionable significance. Mild gastritis.   eye lid surgery     KNEE SURGERY Left    REPLACEMENT TOTAL KNEE Right 06/03/2021    Family History  Problem Relation Age of Onset   Diabetes type II Mother    Hyperlipidemia Mother    Hypertension Mother    Dementia Mother    Heart attack Father    Hyperlipidemia Father    Hypertension Father    Stroke Father    Diabetes type II Father    Colon cancer Daughter 74    Cancer Daughter    Esophageal cancer Neg Hx    Rectal cancer Neg Hx    Stomach cancer Neg Hx     Social History   Socioeconomic History   Marital status: Married    Spouse name: Joneen Boers   Number of children: 4   Years of education: Not on file   Highest education level: Not on file  Occupational History   Occupation: Homemaker  Tobacco Use   Smoking status: Never   Smokeless tobacco: Never  Vaping Use   Vaping Use: Never used  Substance and Sexual Activity   Alcohol use: Never   Drug use: Never   Sexual activity: Not on file  Other Topics Concern   Not on file  Social History Narrative   Ereka spends a lot if time helping her daughter get to and from oncology appointments   Her husband has started drinking alcohol again   Social Determinants of Health   Financial Resource Strain: High Risk (01/14/2022)   Overall Financial Resource Strain (CARDIA)    Difficulty of Paying Living Expenses: Hard  Food Insecurity: No Food Insecurity (04/03/2020)   Hunger Vital Sign    Worried About Running Out of Food in the Last Year: Never true    Ran Out of Food  in the Last Year: Never true  Transportation Needs: No Transportation Needs (01/14/2022)   PRAPARE - Hydrologist (Medical): No    Lack of Transportation (Non-Medical): No  Physical Activity: Not on file  Stress: Stress Concern Present (09/06/2020)   [place]    Feeling of Stress : To some extent  Social Connections: Not on file  Intimate Partner Violence: Not At Risk (09/06/2020)   Humiliation, Afraid, Rape, and Kick questionnaire    Fear of Current or Ex-Partner: No    Emotionally Abused: No    Physically Abused: No    Sexually Abused: No    Outpatient Medications Prior to Visit  Medication Sig Dispense Refill   Acetaminophen (TYLENOL PO) Take by mouth as needed.     Cetirizine HCl (ZYRTEC ALLERGY) 10 MG CAPS Take 1 capsule (10  mg total) by mouth daily. 90 capsule 3   Cholecalciferol (VITAMIN D3) 25 MCG (1000 UT) CAPS Take 1 capsule (1,000 Units total) by mouth daily. 180 capsule 3   dapagliflozin propanediol (FARXIGA) 10 MG TABS tablet Take 1 tablet (10 mg total) by mouth daily before breakfast. 90 tablet 3   ezetimibe (ZETIA) 10 MG tablet Take 1 tablet (10 mg total) by mouth daily. 90 tablet 1   Ferrous Sulfate (IRON PO) Take 2 tablets by mouth daily in the afternoon.     FLUoxetine (PROZAC) 20 MG capsule Take 2 capsules (40 mg total) by mouth daily. 180 capsule 1   glucose blood test strip 1 each by Other route 2 (two) times daily. Use as instructed 100 each 2   icosapent Ethyl (VASCEPA) 1 g capsule Take 2 capsules (2 g total) by mouth 2 (two) times daily. 360 capsule 3   losartan (COZAAR) 100 MG tablet Take 1 tablet (100 mg total) by mouth daily. 90 tablet 1   montelukast (SINGULAIR) 10 MG tablet Take 1 tablet (10 mg total) by mouth at bedtime. 90 tablet 3   pantoprazole (PROTONIX) 40 MG tablet Take 1 tablet (40 mg total) by mouth daily. 90 tablet 3   PX Lancets MicroThin 33G MISC 1 each by Does not apply route 2 (two) times daily. 100 each 2   simvastatin (ZOCOR) 40 MG tablet Take 1 tablet (40 mg total) by mouth at bedtime. 90 tablet 0   Vitamin D, Ergocalciferol, (DRISDOL) 1.25 MG (50000 UNIT) CAPS capsule Take 1 capsule (50,000 Units total) by mouth every 7 (seven) days. 5 capsule 2   No facility-administered medications prior to visit.    Allergies  Allergen Reactions   Meloxicam Other (See Comments)   Rosuvastatin     Muscle pain    Review of Systems  Constitutional:  Positive for fatigue. Negative for chills and fever.  HENT:  Positive for congestion, ear pain, rhinorrhea, sinus pressure and sinus pain. Negative for sore throat.   Eyes:  Eye pain: not constant.  Respiratory:  Positive for cough, shortness of breath and wheezing.   Cardiovascular:  Negative for chest pain.  Gastrointestinal:   Negative for abdominal pain, diarrhea and nausea.  Neurological:  Positive for light-headedness and headaches.  Psychiatric/Behavioral:  Negative for dysphoric mood. The patient is not hyperactive.        Objective:    Physical Exam HENT:     Right Ear: Tympanic membrane normal.     Left Ear: Tympanic membrane normal.     Nose: Congestion and rhinorrhea present.  Right Sinus: Maxillary sinus tenderness present.     Left Sinus: Maxillary sinus tenderness present.     Mouth/Throat:     Mouth: Mucous membranes are moist.  Eyes:     Pupils: Pupils are equal, round, and reactive to light.  Cardiovascular:     Rate and Rhythm: Normal rate and regular rhythm.     Pulses: Normal pulses.  Pulmonary:     Effort: Pulmonary effort is normal.  Neurological:     Mental Status: She is alert.    BP (!) 140/60   Pulse 84   Temp (!) 96.8 F (36 C)   Resp 16   Ht _0  (1.549 m)   Wt 145 lb (65.8 kg)   BMI 27.40 kg/m  Wt Readings from Last 3 Encounters:  05/14/22 145 lb (65.8 kg)  04/22/22 145 lb 3.2 oz (65.9 kg)  02/20/22 147 lb (66.7 kg)    Health Maintenance Due  Topic Date Due   DEXA SCAN  07/21/2019   MAMMOGRAM  07/22/2019   COVID-19 Vaccine (6 - 2023-24 season) 02/14/2022    There are no preventive care reminders to display for this patient.   Lab Results  Component Value Date   TSH 2.830 04/22/2022   Lab Results  Component Value Date   WBC 5.2 04/22/2022   HGB 14.1 04/22/2022   HCT 43.1 04/22/2022   MCV 93 04/22/2022   PLT 176 04/22/2022   Lab Results  Component Value Date   NA 142 04/22/2022   K 4.3 04/22/2022   CO2 24 04/22/2022   GLUCOSE 180 (H) 04/22/2022   BUN 21 04/22/2022   CREATININE 1.28 (H) 04/22/2022   BILITOT 0.9 04/22/2022   ALKPHOS 72 04/22/2022   AST 15 04/22/2022   ALT 11 04/22/2022   PROT 7.0 04/22/2022   ALBUMIN 4.9 (H) 04/22/2022   CALCIUM 10.1 04/22/2022   EGFR 44 (L) 04/22/2022   Lab Results  Component Value Date   CHOL  183 04/22/2022   Lab Results  Component Value Date   HDL 43 04/22/2022   Lab Results  Component Value Date   LDLCALC 108 (H) 04/22/2022   Lab Results  Component Value Date   TRIG 182 (H) 04/22/2022   Lab Results  Component Value Date   CHOLHDL 4.3 04/22/2022   Lab Results  Component Value Date   HGBA1C 7.6 (H) 04/22/2022       Assessment & Plan:   Problem List Items Addressed This Visit       Respiratory   Acute non-recurrent maxillary sinusitis    COVID and Flu negative Treated for acute sinusitis with Augmentin Finish the course of antibiotics Drink lots of water Call to the clinic if symptoms do not relief to reevaluate.       Relevant Medications   amoxicillin-clavulanate (AUGMENTIN) 875-125 MG tablet     Other   Acute cough - Primary    COVID and Flu negative.      Relevant Orders   POC COVID-19 (Completed)   POCT Influenza A/B (Completed)     Follow-up: Suggested to come back if there is no recovery in symptoms after finishing the course of antibiotics.  An After Visit Summary was printed and given to the patient.   Neil Crouch, DNP, [place] [phone]

## 2022-05-14 NOTE — Patient Instructions (Signed)
Finish the course of antibiotics Drink lots of water Call to the clinic if symptoms do not relief to reevaluate.

## 2022-05-14 NOTE — Progress Notes (Deleted)
   Acute Office Visit  Subjective:     Patient ID: ANAHIT KLUMB, female    DOB: 10-Mar-1946, 76 y.o.   MRN: [id number]  No chief complaint on file.   HPI Patient is in today for ***  ROS      Objective:    There were no vitals taken for this visit. {Vitals History (Optional):23777}  Physical Exam  No results found for any visits on 05/14/22.      Assessment & Plan:   Problem List Items Addressed This Visit   None Visit Diagnoses     Acute cough    -  Primary   Relevant Orders   POC COVID-19       No orders of the defined types were placed in this encounter.   No follow-ups on file.  Neil Crouch, FNP

## 2022-05-18 ENCOUNTER — Encounter: Payer: Self-pay | Admitting: Family Medicine

## 2022-05-18 DIAGNOSIS — J0100 Acute maxillary sinusitis, unspecified: Secondary | ICD-10-CM | POA: Insufficient documentation

## 2022-05-18 DIAGNOSIS — R051 Acute cough: Secondary | ICD-10-CM | POA: Insufficient documentation

## 2022-05-18 NOTE — Assessment & Plan Note (Signed)
COVID and Flu negative.

## 2022-05-18 NOTE — Assessment & Plan Note (Addendum)
COVID and Flu negative Treated for acute sinusitis with Augmentin Finish the course of antibiotics Drink lots of water Call to the clinic if symptoms do not relief to reevaluate.

## 2022-05-20 ENCOUNTER — Ambulatory Visit (INDEPENDENT_AMBULATORY_CARE_PROVIDER_SITE_OTHER): Payer: PPO

## 2022-05-20 DIAGNOSIS — E782 Mixed hyperlipidemia: Secondary | ICD-10-CM

## 2022-05-20 DIAGNOSIS — I152 Hypertension secondary to endocrine disorders: Secondary | ICD-10-CM

## 2022-05-20 DIAGNOSIS — E1169 Type 2 diabetes mellitus with other specified complication: Secondary | ICD-10-CM

## 2022-05-20 NOTE — Patient Instructions (Signed)
Visit Information   Goals Addressed   None    Patient Care Plan: CCM Pharmacy Care Plan     Problem Identified: htn, hld, dm   Priority: High  Onset Date: 11/13/2020     Long-Range Goal: Disease State Management   Start Date: 11/13/2020  Expected End Date: 11/13/2021  Recent Progress: On track  Priority: High  Note:   Current Barriers: Unable to independently afford treatment regimen   Pharmacist Clinical Goal(s):  Patient will verbalize ability to afford treatment regimen through collaboration with PharmD and provider.   Interventions: 1:1 collaboration with [name], MD regarding development and update of comprehensive plan of care as evidenced by provider attestation and co-signature Inter-disciplinary care team collaboration (see longitudinal plan of care) Comprehensive medication review performed; medication list updated in electronic medical record   Hypertension (BP goal <130/80) BP Readings from Last 3 Encounters:  05/14/22 (!) 140/60  04/22/22 118/62  02/20/22 130/78  -Controlled. Diagnosed >25 years ago.  -Current treatment: Losartan 100 mg daily Appropriate, Effective, Safe, Accessible -Medications previously tried: Losartan-hydrochlorothiazide -Current home readings: Doesn't check at home -Current dietary habits: eats lunch out and working to [employer] -Current exercise habits: [place] [employer] -Denies hypotensive/hypertensive symptoms -Educated on BP goals and benefits of medications for prevention of heart attack, stroke and kidney damage; Daily salt intake goal < 2300 mg; Exercise goal of 150 minutes per week; -Counseled to monitor BP at home as needed, document, and provide log at future appointments -Counseled on diet and exercise extensively December 2023: Patient's BP elevated, will most likely fail BP metric for the year because of this (Normally good but she's sick at visit). Will ask PCP about having nurse visit with  patient.    Hyperlipidemia: (LDL goal < 70) The 10-year ASCVD risk score (Arnett DK, et al., 2019) is: 38.5%   Values used to calculate the score:     Age: 76 years     Sex: Female     Is Non-Hispanic African American: No     Diabetic: Yes     Tobacco smoker: No     Systolic Blood Pressure: 329 mmHg     Is BP treated: Yes     HDL Cholesterol: 43 mg/dL     Total Cholesterol: 183 mg/dL Lab Results  Component Value Date   CHOL 183 04/22/2022   CHOL 149 01/14/2022   CHOL 171 10/03/2021   Lab Results  Component Value Date   HDL 43 04/22/2022   HDL 44 01/14/2022   HDL 50 10/03/2021   Lab Results  Component Value Date   LDLCALC 108 (H) 04/22/2022   LDLCALC 78 01/14/2022   LDLCALC 102 (H) 10/03/2021   Lab Results  Component Value Date   TRIG 182 (H) 04/22/2022   TRIG 156 (H) 01/14/2022   TRIG 102 10/03/2021   Lab Results  Component Value Date   CHOLHDL 4.3 04/22/2022   CHOLHDL 3.4 01/14/2022   CHOLHDL 3.6 07/04/2021  No results found for: "LDLDIRECT" -Not ideally controlled -Current treatment: Rosuvastatin 68m mg daily at bedtime Appropriate, Effective, Safe, Accessible Vascepa 2 BID Query Appropriate, Query effective, Query Safe, Query accessible Ezetimibe 159mAppropriate, Effective, Safe, Accessible -Medications previously tried: none reported -Current dietary patterns: eats out at lunch. Trying to make smarter decisions -Current exercise habits: [place] [employer] [place] -Educated on Cholesterol goals; Benefits of statin for ASCVD risk reduction; Importance of limiting foods high in cholesterol;  Exercise goal of 150 minutes per week; -Counseled on diet and exercise extensively Feb 2023: Recommend Atorvastatin in case renal function worsens. Patient not taking Vascepa due to fears of it being a "High dose." Used metaphor explaining how 1000 feathers still weighs less than 100 bricks and that you can't determine how "strong" a med is based on the  mg December 2023: Maintain current therapy per PCP. Will re-do Vascepa grant   Diabetes (A1c goal <7%) Lab Results  Component Value Date   HGBA1C 7.6 (H) 04/22/2022   HGBA1C 7.6 (H) 01/14/2022   HGBA1C 7.7 (H) 10/03/2021   Lab Results  Component Value Date   MICROALBUR 30 03/29/2021   LDLCALC 108 (H) 04/22/2022   CREATININE 1.28 (H) 04/22/2022   Lab Results  Component Value Date   NA 142 04/22/2022   K 4.3 04/22/2022   CREATININE 1.28 (H) 04/22/2022   EGFR 44 (L) 04/22/2022   GFRNONAA 41 (L) 05/04/2020   GLUCOSE 180 (H) 04/22/2022   Lab Results  Component Value Date   WBC 5.2 04/22/2022   HGB 14.1 04/22/2022   HCT 43.1 04/22/2022   MCV 93 04/22/2022   PLT 176 04/22/2022   -Not ideally controlled -Current medications: Farxiga 39m Appropriate, Effective, Safe, Accessible 2023: Approved 2024: Approved -Medications previously tried: jJoline Maxcy-Current home glucose readings fasting glucose:  December 2023: Patient not testing at home. Counseled to start and to write down post prandial glucose: not checking -Denies hypoglycemic/hyperglycemic symptoms -Current meal patterns: Trying to make smart choices. Eats out with daughter during lunch some and sandwich or simple lunch other days.Cereal for breakfast.  -Current exercise: [place] [employer] [place] unable to currently due to anemia.  -Educated on A1c and blood sugar goals; Complications of diabetes including kidney damage, retinal damage, and cardiovascular disease; Exercise goal of 150 minutes per week; Benefits of weight loss; Benefits of routine self-monitoring of blood sugar; Carbohydrate counting and/or plate method -Counseled to check feet daily and get yearly eye exams -Counseled on diet and exercise extensively August 2023: Patient was at the grocery store when I called. She forgot about the visit. She stated she's having trouble paying for FIran I asked why because  we filled out PAP for her in December 2022. She does not remember that. Counseled her to be home and will reschedule appt ASAP. December 2023: PCP note in past had something about Rybelsus but patient unable to afford. Patient most likely would qualify for PAP. Most recent note from PCP had Pioglitazone recommendation, although that wasn't sent. Will ask PCP about Rybelsus PAP.  -Farxiga PAP auto-renewed for 2024 per patient but she needs refill script. Coordinate with CCM team to get sent from [facility]     Patient Goals/Self-Care Activities Patient will: - take medications as prescribed focus on medication adherence by using pill box check glucose daily, document, and provide at future appointments target a minimum of 150 minutes of moderate intensity exercise weekly  Follow Up Plan: Telephone follow up appointment with care management team member scheduled for: May 2024  NArizona Constable PFloridaD. -- [phone]     Ms. SLuckingwas given information about Chronic Care Management services today including:  CCM service includes personalized support from designated clinical staff supervised by her physician, including individualized plan of care and coordination with other care providers 24/7 contact phone numbers for assistance for urgent and routine care needs. Standard insurance, coinsurance, copays and deductibles apply for chronic  care management only during months in which we provide at least 20 minutes of these services. Most insurances cover these services at 100%, however patients may be responsible for any copay, coinsurance and/or deductible if applicable. This service may help you avoid the need for more expensive face-to-face services. Only one practitioner may furnish and bill the service in a calendar month. The patient may stop CCM services at any time (effective at the end of the month) by phone call to the office staff.  Patient agreed to services and verbal consent  obtained.   The patient verbalized understanding of instructions, educational materials, and care plan provided today and DECLINED offer to receive copy of patient instructions, educational materials, and care plan.  The pharmacy team will reach out to the patient again over the next 30 days.   Lane Hacker, [place]

## 2022-05-20 NOTE — Progress Notes (Signed)
Chronic Care Management Pharmacy Note  05/20/2022 Name:  Alicia Jordan MRN:  [id number] DOB:  09/06/1945   Summary:  Patient was at grocery store. She forgot about visit. Counseled patient on importance of being home with meds for visit. Will have concierge reschedule  Plan Updates:  PCP wrote about prescribing Pioglitazone per recent note. However, CCM team should be able to get Rybelsus approved. Will ask PCP about adding that instead of Pioglitazone. Patient's final BP reading of 2023 will be above 409 systolic. She has no f/u until 2024. Will let PCP know so we can schedule nurse f/u before end of year so we don't fail BP metric. Patient's BP tends to be great but she was sick at last visit.  Subjective: Alicia Jordan is an 76 y.o. year old female who is a primary patient of Cox, Kirsten, MD.  The CCM team was consulted for assistance with disease management and care coordination needs.    Engaged with patient face to face for follow up visit in response to provider referral for pharmacy case management and/or care coordination services.   Consent to Services:  The patient was given information about Chronic Care Management services, agreed to services, and gave verbal consent prior to initiation of services.  Please see initial visit note for detailed documentation.   Patient Care Team: [name], MD as PCP - General (Family Medicine) Jackquline Denmark, MD as Consulting Physician (Gastroenterology) Joya Salm, MD as Referring Physician (Orthopedic Surgery) Vickey Huger, MD as Consulting Physician (Orthopedic Surgery) Lane Hacker, [facility] (Pharmacist)    Recent office visits:  02-20-2022 Erie Noe, LPN. Mammogram screening.   Recent consult visits:  03-26-2022 Barbara Cower, OT. Therapy on left thumb.   03-12-2022 Roseanne Kaufman, MD. Follow up orthopedic assessment.   02-19-2022 Alen Blew (Optometry). Unable to view encounter.    02-13-2022  Roseanne Kaufman, MD. Follow up.    01-22-2022 Denamur, Darin (Chiropractic Medicine). Unable to view encounter.   Hospital visits:  None in previous 6 months   Objective:  Lab Results  Component Value Date   CREATININE 1.28 (H) 04/22/2022   BUN 21 04/22/2022   GFRNONAA 41 (L) 05/04/2020   GFRAA 47 (L) 05/04/2020   NA 142 04/22/2022   K 4.3 04/22/2022   CALCIUM 10.1 04/22/2022   CO2 24 04/22/2022   GLUCOSE 180 (H) 04/22/2022    Lab Results  Component Value Date/Time   HGBA1C 7.6 (H) 04/22/2022 08:43 AM   HGBA1C 7.6 (H) 01/14/2022 08:28 AM   MICROALBUR 30 03/29/2021 08:59 AM   MICROALBUR 30 05/04/2020 09:13 AM    Last diabetic Eye exam:  Lab Results  Component Value Date/Time   HMDIABEYEEXA No Retinopathy 02/19/2022 12:00 AM    Last diabetic Foot exam: No results found for: "HMDIABFOOTEX"   Lab Results  Component Value Date   CHOL 183 04/22/2022   HDL 43 04/22/2022   LDLCALC 108 (H) 04/22/2022   TRIG 182 (H) 04/22/2022   CHOLHDL 4.3 04/22/2022       Latest Ref Rng & Units 04/22/2022    8:43 AM 01/14/2022    8:28 AM 10/03/2021   12:00 AM  Hepatic Function  Total Protein 6.0 - 8.5 g/dL 7.0  7.0  7.1   Albumin 3.8 - 4.8 g/dL 4.9  4.7  4.7   AST 0 - 40 IU/L _0 ALT 0 - 32 IU/L _1 Alk Phosphatase 44 -  121 IU/L 72  67  68   Total Bilirubin 0.0 - 1.2 mg/dL 0.9  1.0  0.9     Lab Results  Component Value Date/Time   TSH 2.830 04/22/2022 08:43 AM   TSH 1.890 07/04/2021 09:49 AM   FREET4 1.31 07/04/2021 09:49 AM       Latest Ref Rng & Units 04/22/2022    8:43 AM 01/14/2022    8:28 AM 10/03/2021   12:00 AM  CBC  WBC 3.4 - 10.8 x10E3/uL 5.2  5.5  5.3   Hemoglobin 11.1 - 15.9 g/dL 14.1  13.4  14.2   Hematocrit 34.0 - 46.6 % 43.1  40.2  42.1   Platelets 150 - 450 x10E3/uL 176  161  153     Lab Results  Component Value Date/Time   VD25OH 28.3 (L) 04/22/2022 08:43 AM   VD25OH 32.3 07/04/2021 09:49 AM    Clinical ASCVD: Yes  The 10-year  ASCVD risk score (Arnett DK, et al., 2019) is: 38.5%   Values used to calculate the score:     Age: 56 years     Sex: Female     Is Non-Hispanic African American: No     Diabetic: Yes     Tobacco smoker: No     Systolic Blood Pressure: 627 mmHg     Is BP treated: Yes     HDL Cholesterol: 43 mg/dL     Total Cholesterol: 183 mg/dL       05/14/2022    9:31 AM 02/20/2022    9:04 AM 01/14/2022    7:48 AM  Depression screen PHQ 2/9  Decreased Interest 0 0 0  Down, Depressed, Hopeless 0 0 0  PHQ - 2 Score 0 0 0  Altered sleeping 0 0 0  Tired, decreased energy _0 Change in appetite 1 0 0  Feeling bad or failure about yourself  0 0 0  Trouble concentrating 0 0 0  Moving slowly or fidgety/restless 0 0 0  Suicidal thoughts 0 0 0  PHQ-9 Score _1 Difficult doing work/chores Not difficult at all Not difficult at all Not difficult at all     Other: (CHADS2VASc if Afib, MMRC or CAT for COPD, ACT, DEXA)  Social History   Tobacco Use  Smoking Status Never  Smokeless Tobacco Never   BP Readings from Last 3 Encounters:  05/14/22 (!) 140/60  04/22/22 118/62  02/20/22 130/78   Pulse Readings from Last 3 Encounters:  05/14/22 84  04/22/22 75  02/20/22 75   Wt Readings from Last 3 Encounters:  05/14/22 145 lb (65.8 kg)  04/22/22 145 lb 3.2 oz (65.9 kg)  02/20/22 147 lb (66.7 kg)   BMI Readings from Last 3 Encounters:  05/14/22 27.40 kg/m  04/22/22 27.44 kg/m  02/20/22 27.78 kg/m    Assessment/Interventions: Review of patient past medical history, allergies, medications, health status, including review of consultants reports, laboratory and other test data, was performed as part of comprehensive evaluation and provision of chronic care management services.   SDOH:  (Social Determinants of Health) assessments and interventions performed: Yes SDOH Interventions    Flowsheet Row Chronic Care Management from 05/20/2022 in [place] Management from  01/14/2022 in [place] Management from 08/06/2021 in [place] Office Visit from 07/04/2021 in [place] Office Visit from 03/29/2021 in [place] Office Visit from 09/14/2020 in [place]  SDOH Interventions  Transportation Interventions Intervention Not Indicated Intervention Not Indicated Intervention Not Indicated -- -- --  Depression Interventions/Treatment  -- -- -- Currently on Treatment Currently on Treatment Medication  Financial Strain Interventions Other (Comment)  [PAP] Other (Comment)  [PAP] Intervention Not Indicated -- -- --      SDOH Screenings   Food Insecurity: No Food Insecurity (04/03/2020)  Housing: Low Risk  (09/06/2020)  Transportation Needs: No Transportation Needs (05/20/2022)  Depression (PHQ2-9): Low Risk  (05/14/2022)  Financial Resource Strain: High Risk (05/20/2022)  Stress: Stress Concern Present (09/06/2020)  Tobacco Use: Low Risk  (05/18/2022)    [place]  Allergies  Allergen Reactions   Meloxicam Other (See Comments)   Rosuvastatin     Muscle pain    Medications Reviewed Today     Reviewed by Lane Hacker, [facility] (Pharmacist) on 05/20/22 at [phone]  Med List Status: <None>   Medication Order Taking? Sig Documenting Provider Last Dose Status Informant  Acetaminophen (TYLENOL PO) [id number] No Take by mouth as needed. [provider] Taking Active   amoxicillin-clavulanate (AUGMENTIN) 875-125 MG tablet [id number]  Take 1 tablet by mouth 2 (two) times daily. Neil Crouch, FNP  Active   Cetirizine HCl (ZYRTEC ALLERGY) 10 MG CAPS [id number] No Take 1 capsule (10 mg total) by mouth daily. Cox, Kirsten, MD Taking Active   Cholecalciferol (VITAMIN D3) 25 MCG (1000 UT) CAPS [id number] No Take 1 capsule (1,000 Units total) by mouth daily. Cox, Kirsten, MD Taking Active   dapagliflozin propanediol (FARXIGA) 10 MG TABS tablet [id number] No Take 1 tablet (10 mg total) by mouth daily before  breakfast. Cox, Kirsten, MD Taking Active   ezetimibe (ZETIA) 10 MG tablet [id number] No Take 1 tablet (10 mg total) by mouth daily. Cox, Kirsten, MD Taking Active   Ferrous Sulfate (IRON PO) [id number] No Take 2 tablets by mouth daily in the afternoon. [provider] Taking Active   FLUoxetine (PROZAC) 20 MG capsule [id number] No Take 2 capsules (40 mg total) by mouth daily. Cox, Kirsten, MD Taking Active   glucose blood test strip [id number] No 1 each by Other route 2 (two) times daily. Use as instructed Lillard Anes, MD Taking Active   icosapent Ethyl (VASCEPA) 1 g capsule [id number] No Take 2 capsules (2 g total) by mouth 2 (two) times daily. Cox, Kirsten, MD Taking Active   losartan (COZAAR) 100 MG tablet [id number] No Take 1 tablet (100 mg total) by mouth daily. Lillard Anes, MD Taking Active   montelukast (SINGULAIR) 10 MG tablet [id number] No Take 1 tablet (10 mg total) by mouth at bedtime. Cox, Kirsten, MD Taking Active   pantoprazole (PROTONIX) 40 MG tablet [id number] No Take 1 tablet (40 mg total) by mouth daily. [name], MD Taking Active   PX Lancets MicroThin 33G [place] [id number] No 1 each by Does not apply route 2 (two) times daily. Lillard Anes, MD Taking Active   simvastatin (ZOCOR) 40 MG tablet [id number] No Take 1 tablet (40 mg total) by mouth at bedtime. Cox, Kirsten, MD Taking Active   Vitamin D, Ergocalciferol, (DRISDOL) 1.25 MG (50000 UNIT) CAPS capsule [id number] No Take 1 capsule (50,000 Units total) by mouth every 7 (seven) days. [name], MD Taking Active             Patient Active Problem List   Diagnosis Date Noted   Acute cough 05/18/2022   Acute non-recurrent maxillary sinusitis 05/18/2022   Need for hepatitis C screening test 04/21/2022  Non-seasonal allergic rhinitis due to pollen 01/14/2022   Stage 3b chronic kidney disease (CKD) ([place]) 01/14/2022   Mild recurrent major depression ([place]) 04/14/2021   Vitamin D deficiency  03/31/2021   Primary osteoarthritis of both knees 03/31/2021   Left carpal tunnel syndrome 03/31/2021   Diabetic glomerulopathy ([place]) 03/31/2021   Iron deficiency anemia 02/09/2021   B12 deficiency 02/09/2021   Hypertensive kidney disease with stage 3b chronic kidney disease ([place]) 09/15/2020   Chronic pain of both knees 04/17/2020   Chronic right shoulder pain 08/21/2019   Dyslipidemia associated with type 2 diabetes mellitus ([place]) 08/11/2019   Mixed hyperlipidemia 08/11/2019   GERD without esophagitis 04/18/2009    Immunization History  Administered Date(s) Administered   Fluad Quad(high Dose 65+) 02/08/2019, 03/29/2021, 04/22/2022   Influenza-Unspecified 02/27/2020   Moderna Sars-Covid-2 Vaccination 10/31/2019, 11/28/2019, 05/31/2020, 10/12/2020   PNEUMOCOCCAL CONJUGATE-20 11/22/2021   Pfizer Covid-19 Vaccine Bivalent Booster 81yr & up 03/29/2021   Pneumococcal Conjugate-13 05/13/2014   Pneumococcal Polysaccharide-23 01/29/2017   Tdap 05/03/2012, 01/31/2022   Zoster Recombinat (Shingrix) 06/11/2017   Zoster, Live 05/16/2010    Conditions to be addressed/monitored:  Hypertension, Hyperlipidemia and Diabetes  Care Plan : [place] Updates made by KLane Hacker RVandenberg AFBsince 05/20/2022 12:00 AM     Problem: htn, hld, dm   Priority: High  Onset Date: 11/13/2020     Long-Range Goal: Disease State Management   Start Date: 11/13/2020  Expected End Date: 11/13/2021  Recent Progress: On track  Priority: High  Note:   Current Barriers: Unable to independently afford treatment regimen   Pharmacist Clinical Goal(s):  Patient will verbalize ability to afford treatment regimen through collaboration with PharmD and provider.   Interventions: 1:1 collaboration with CRochel Brome MD regarding development and update of comprehensive plan of care as evidenced by provider attestation and co-signature Inter-disciplinary care team collaboration (see longitudinal plan of  care) Comprehensive medication review performed; medication list updated in electronic medical record   Hypertension (BP goal <130/80) BP Readings from Last 3 Encounters:  05/14/22 (!) 140/60  04/22/22 118/62  02/20/22 130/78  -Controlled. Diagnosed >25 years ago.  -Current treatment: Losartan 100 mg daily Appropriate, Effective, Safe, Accessible -Medications previously tried: Losartan-hydrochlorothiazide -Current home readings: Doesn't check at home -Current dietary habits: eats lunch out and working to [employer]-Current exercise habits: [place] [employer] -Denies hypotensive/hypertensive symptoms -Educated on BP goals and benefits of medications for prevention of heart attack, stroke and kidney damage; Daily salt intake goal < 2300 mg; Exercise goal of 150 minutes per week; -Counseled to monitor BP at home as needed, document, and provide log at future appointments -Counseled on diet and exercise extensively December 2023: Patient's BP elevated, will most likely fail BP metric for the year because of this (Normally good but she's sick at visit). Will ask PCP about having nurse visit with patient.    Hyperlipidemia: (LDL goal < 70) The 10-year ASCVD risk score (Arnett DK, et al., 2019) is: 38.5%   Values used to calculate the score:     Age: [age]years     Sex: Female     Is Non-Hispanic African American: No     Diabetic: Yes     Tobacco smoker: No     Systolic Blood Pressure: 1295mmHg     Is BP treated: Yes     HDL Cholesterol: 43 mg/dL     Total Cholesterol: 183 mg/dL Lab Results  Component Value Date   CHOL 183 04/22/2022   CHOL 149 01/14/2022   CHOL 171 10/03/2021   Lab Results  Component Value Date   HDL 43 04/22/2022   HDL 44 01/14/2022   HDL 50 10/03/2021   Lab Results  Component Value Date   LDLCALC 108 (H) 04/22/2022   LDLCALC 78 01/14/2022   LDLCALC 102 (H) 10/03/2021   Lab Results  Component Value Date   TRIG 182 (H)  04/22/2022   TRIG 156 (H) 01/14/2022   TRIG 102 10/03/2021   Lab Results  Component Value Date   CHOLHDL 4.3 04/22/2022   CHOLHDL 3.4 01/14/2022   CHOLHDL 3.6 07/04/2021  No results found for: "LDLDIRECT" -Not ideally controlled -Current treatment: Rosuvastatin 28m mg daily at bedtime Appropriate, Effective, Safe, Accessible Vascepa 2 BID Query Appropriate, Query effective, Query Safe, Query accessible Ezetimibe 169mAppropriate, Effective, Safe, Accessible -Medications previously tried: none reported -Current dietary patterns: eats out at lunch. Trying to make smarter decisions -Current exercise habits: [place] [employer] -Educated on Cholesterol goals; Benefits of statin for ASCVD risk reduction; Importance of limiting foods high in cholesterol; Exercise goal of 150 minutes per week; -Counseled on diet and exercise extensively Feb 2023: Recommend Atorvastatin in case renal function worsens. Patient not taking Vascepa due to fears of it being a "High dose." Used metaphor explaining how 1000 feathers still weighs less than 100 bricks and that you can't determine how "strong" a med is based on the mg December 2023: Maintain current therapy per PCP. Will re-do Vascepa grant   Diabetes (A1c goal <7%) Lab Results  Component Value Date   HGBA1C 7.6 (H) 04/22/2022   HGBA1C 7.6 (H) 01/14/2022   HGBA1C 7.7 (H) 10/03/2021   Lab Results  Component Value Date   MICROALBUR 30 03/29/2021   LDLCALC 108 (H) 04/22/2022   CREATININE 1.28 (H) 04/22/2022   Lab Results  Component Value Date   NA 142 04/22/2022   K 4.3 04/22/2022   CREATININE 1.28 (H) 04/22/2022   EGFR 44 (L) 04/22/2022   GFRNONAA 41 (L) 05/04/2020   GLUCOSE 180 (H) 04/22/2022   Lab Results  Component Value Date   WBC 5.2 04/22/2022   HGB 14.1 04/22/2022   HCT 43.1 04/22/2022   MCV 93 04/22/2022   PLT 176 04/22/2022   -Not ideally controlled -Current medications: Farxiga 1082mppropriate,  Effective, Safe, Accessible 2023: Approved 2024: Approved -Medications previously tried: janJoline Maxcyurrent home glucose readings fasting glucose:  December 2023: Patient not testing at home. Counseled to start and to write down post prandial glucose: not checking -Denies hypoglycemic/hyperglycemic symptoms -Current meal patterns: Trying to make smart choices. Eats out with daughter during lunch some and sandwich or simple lunch other days.Cereal for breakfast.  -Current exercise: [place] [employer] [facility] but unable to currently due to anemia.  -Educated on A1c and blood sugar goals; Complications of diabetes including kidney damage, retinal damage, and cardiovascular disease; Exercise goal of 150 minutes per week; Benefits of weight loss; Benefits of routine self-monitoring of blood sugar; Carbohydrate counting and/or plate method -Counseled to check feet daily and get yearly eye exams -Counseled on diet and exercise extensively August 2023: Patient was at the grocery store when I called. She forgot about the visit. She stated she's having trouble paying for [place] asked why because we filled out PAP for her in December 2022. She does not remember that. Counseled her  to be home and will reschedule appt ASAP. December 2023: PCP note in past had something about Rybelsus but patient unable to afford. Patient most likely would qualify for PAP. Most recent note from PCP had Pioglitazone recommendation, although that wasn't sent. Will ask PCP about Rybelsus PAP.  -Farxiga PAP auto-renewed for 2024 per patient but she needs refill script. Coordinate with CCM team to get sent from [facility] pool     Patient Goals/Self-Care Activities Patient will: - take medications as prescribed focus on medication adherence by using pill box check glucose daily, document, and provide at future appointments target a minimum of 150 minutes of moderate intensity exercise  weekly  Follow Up Plan: Telephone follow up appointment with care management team member scheduled for: May 2024  [place] Constable, [place].D. - [phone]        Medication Assistance:  [place]: -Approved 2023 -Approved 2024    Patient's preferred pharmacy is:  [facility] [address], [place] [address] [place] [address] Phone: [phone] Fax: [place], [place] [address] [place] [address] Phone: [phone] Fax: [phone]  [facility] - [place], [place] - [address] [place] [address] Phone: [phone] Fax: [phone]   Uses pill box? Yes Pt endorses 100% compliance  We discussed: Benefits of medication synchronization, packaging and delivery as well as enhanced pharmacist oversight with [facility]. Patient decided to: Utilize [facility] for medication synchronization, packaging and delivery -Verbal consent obtained for [facility] enhanced pharmacy services (medication synchronization, adherence packaging, delivery coordination). A medication sync plan was created to allow patient to get all medications delivered once every 30 to 90 days per patient preference. Patient understands they have freedom to choose pharmacy and clinical pharmacist will coordinate care between all prescribers and [facility].   Care Plan and Follow Up Patient Decision:  Patient agrees to Care Plan and Follow-up.  Plan: Telephone follow up appointment with care management team member scheduled for:  May 2024  Ari[place] Constable, [place].D. - [phone]

## 2022-05-28 ENCOUNTER — Encounter: Payer: Self-pay | Admitting: Nurse Practitioner

## 2022-05-28 ENCOUNTER — Ambulatory Visit (INDEPENDENT_AMBULATORY_CARE_PROVIDER_SITE_OTHER): Payer: PPO | Admitting: Nurse Practitioner

## 2022-05-28 VITALS — BP 140/62 | HR 72 | Temp 97.2°F | Ht 61.0 in | Wt 145.0 lb

## 2022-05-28 DIAGNOSIS — R1013 Epigastric pain: Secondary | ICD-10-CM | POA: Insufficient documentation

## 2022-05-28 DIAGNOSIS — K529 Noninfective gastroenteritis and colitis, unspecified: Secondary | ICD-10-CM | POA: Insufficient documentation

## 2022-05-28 LAB — POC COVID19 BINAXNOW: SARS Coronavirus 2 Ag: NEGATIVE

## 2022-05-28 LAB — POCT INFLUENZA A/B
Influenza A, POC: NEGATIVE
Influenza B, POC: NEGATIVE

## 2022-05-28 MED ORDER — ONDANSETRON HCL 4 MG PO TABS: 4.0000 mg | ORAL_TABLET | Freq: Three times a day (TID) | ORAL | 0 refills | Status: DC | PRN

## 2022-05-28 NOTE — Patient Instructions (Addendum)
  Follow up as needed   Food Choices to Help Relieve Diarrhea, Adult Diarrhea can make you feel weak and cause you to become dehydrated. Dehydration is a condition in which there is not enough water or other fluids in the body. It is important to choose the right foods and drinks to: Relieve diarrhea. Replace lost fluids and nutrients. Prevent dehydration. What are tips for following this plan? Relieving diarrhea Avoid foods that make your diarrhea worse. These may include: Foods and drinks that are sweetened with high-fructose corn syrup, honey, or sweeteners such as xylitol, sorbitol, and mannitol. Check food labels for these ingredients. Fried, greasy, or spicy foods. Raw fruits and vegetables. Eat foods that are rich in probiotics. These include foods such as yogurt and fermented milk products. Probiotics can help increase healthy bacteria in your stomach and intestines (gastrointestinal or GI tract). This may help digestion and stop diarrhea. If you have lactose intolerance, avoid dairy products. These may make your diarrhea worse. Take medicine to help stop diarrhea only as told by your health care provider. Replacing nutrients  Eat bland, easy-to-digest foods in small amounts as you are able, until your diarrhea starts to get better. These foods include bananas, applesauce, rice, toast, and crackers. Over time, add nutrient-rich foods as your body tolerates them or as told by your health care provider. These include: Well-cooked protein foods, such as eggs, lean meats like fish or chicken without skin, and tofu. Peeled, seeded, and soft-cooked fruits and vegetables. Low-fat dairy products. Whole grains. Take vitamin and mineral supplements as told by your health care provider. Preventing dehydration  Start by sipping water or a solution to prevent dehydration (oral rehydration solution, or ORS). This is a drink that helps replace fluids and minerals your body has lost. You can buy  an ORS at pharmacies and retail stores. Try to drink at least 8-10 cups (2,000-2,500 mL) of fluid each day to help replace lost fluids. If your urine is pale yellow, you are getting enough fluids. You may drink other liquids in addition to water, such as fruit juice that you have added water to (diluted fruit juice) or low-calorie sports drinks, as tolerated or as told by your health care provider. Avoid drinks with caffeine, such as coffee, tea, or soft drinks. Avoid alcohol. This information is not intended to replace advice given to you by your health care provider. Make sure you discuss any questions you have with your health care provider. Document Revised: 11/19/2021 Document Reviewed: 11/19/2021 Elsevier Patient Education  [place].

## 2022-05-28 NOTE — Addendum Note (Signed)
Addended by: Thompson Caul I on: 05/28/2022 09:55 AM   Modules accepted: Orders

## 2022-05-28 NOTE — Assessment & Plan Note (Addendum)
Suggested to continue Protonix 40 mg daily (patient was not taking that medicine since last year).  Suggested to stay away from Caffeine, and fried foods.

## 2022-05-28 NOTE — Progress Notes (Addendum)
Acute Office Visit  Subjective:    Patient ID: Alicia Jordan, female    DOB: 1946-01-18, 76 y.o.   MRN: [id number]  CHIEF COMPLAINT: Abdominal pain  HPI: Patient is in today for nauseous, vomiting recently stopped from Sunday night, diarrhea getting better. Taking Pepto Bismol, Mylanta that helped some for stomach. Patient said she is having frequent belching lately and stopped Protonix since last year. Patient said she went to the [facility] last Sunday and ate something with Corn and after that her vomiting and diarrhea started. She had a history of appendectomy and cholecystectomy.  Past Medical History:  Diagnosis Date   Allergy    Anemia    Anxiety    Arthralgia of left temporomandibular joint    Arthritis    Cervicalgia    Chronic kidney disease, stage 3a (HCC)    Depression    Diabetes mellitus without complication ([place])    Hesitancy of micturition    Hyperlipidemia    Hypertension    Osteoporosis    Other psoriasis    Other specified menopausal and perimenopausal disorders    Overweight    Repeated falls    Residual hemorrhoidal skin tags     Past Surgical History:  Procedure Laterality Date   ABDOMINAL HYSTERECTOMY     APPENDECTOMY     CARPAL TUNNEL RELEASE Right    CHOLECYSTECTOMY     COLONOSCOPY  04/09/2016   Mild sigmoid diverticulosis. Otherwise normal colonoscopy   ESOPHAGOGASTRODUODENOSCOPY  09/10/2012   No obvious esophageal stricture, status post empiric esophageal dilation. Irregular GE junction, questionable significance. Mild gastritis.   eye lid surgery     KNEE SURGERY Left    REPLACEMENT TOTAL KNEE Right 06/03/2021    Family History  Problem Relation Age of Onset   Diabetes type II Mother    Hyperlipidemia Mother    Hypertension Mother    Dementia Mother    Heart attack Father    Hyperlipidemia Father    Hypertension Father    Stroke Father    Diabetes type II Father    Colon cancer Daughter 56   Cancer Daughter    Esophageal  cancer Neg Hx    Rectal cancer Neg Hx    Stomach cancer Neg Hx     Social History   Socioeconomic History   Marital status: Married    Spouse name: Joneen Boers   Number of children: 4   Years of education: Not on file   Highest education level: Not on file  Occupational History   Occupation: Homemaker  Tobacco Use   Smoking status: Never   Smokeless tobacco: Never  Vaping Use   Vaping Use: Never used  Substance and Sexual Activity   Alcohol use: Never   Drug use: Never   Sexual activity: Not on file  Other Topics Concern   Not on file  Social History Narrative   Nelsie spends a lot if time helping her daughter get to and from oncology appointments   Her husband has started drinking alcohol again   Social Determinants of Health   Financial Resource Strain: High Risk (05/20/2022)   Overall Financial Resource Strain (CARDIA)    Difficulty of Paying Living Expenses: Hard  Food Insecurity: No Food Insecurity (04/03/2020)   Hunger Vital Sign    Worried About Running Out of Food in the Last Year: Never true    [place] in the Last Year: Never true  Transportation Needs: No Transportation Needs (05/20/2022)   PRAPARE -  Hydrologist (Medical): No    Lack of Transportation (Non-Medical): No  Physical Activity: Not on file  Stress: Stress Concern Present (09/06/2020)   [place]    Feeling of Stress : To some extent  Social Connections: Not on file  Intimate Partner Violence: Not At Risk (09/06/2020)   Humiliation, Afraid, Rape, and Kick questionnaire    Fear of Current or Ex-Partner: No    Emotionally Abused: No    Physically Abused: No    Sexually Abused: No    Outpatient Medications Prior to Visit  Medication Sig Dispense Refill   Acetaminophen (TYLENOL PO) Take by mouth as needed.     amoxicillin-clavulanate (AUGMENTIN) 875-125 MG tablet Take 1 tablet by mouth 2 (two) times  daily. 20 tablet 0   Cetirizine HCl (ZYRTEC ALLERGY) 10 MG CAPS Take 1 capsule (10 mg total) by mouth daily. 90 capsule 3   Cholecalciferol (VITAMIN D3) 25 MCG (1000 UT) CAPS Take 1 capsule (1,000 Units total) by mouth daily. 180 capsule 3   dapagliflozin propanediol (FARXIGA) 10 MG TABS tablet Take 1 tablet (10 mg total) by mouth daily before breakfast. 90 tablet 3   ezetimibe (ZETIA) 10 MG tablet Take 1 tablet (10 mg total) by mouth daily. 90 tablet 1   Ferrous Sulfate (IRON PO) Take 2 tablets by mouth daily in the afternoon.     FLUoxetine (PROZAC) 20 MG capsule Take 2 capsules (40 mg total) by mouth daily. 180 capsule 1   glucose blood test strip 1 each by Other route 2 (two) times daily. Use as instructed 100 each 2   icosapent Ethyl (VASCEPA) 1 g capsule Take 2 capsules (2 g total) by mouth 2 (two) times daily. 360 capsule 3   losartan (COZAAR) 100 MG tablet Take 1 tablet (100 mg total) by mouth daily. 90 tablet 1   montelukast (SINGULAIR) 10 MG tablet Take 1 tablet (10 mg total) by mouth at bedtime. 90 tablet 3   pantoprazole (PROTONIX) 40 MG tablet Take 1 tablet (40 mg total) by mouth daily. 90 tablet 3   PX Lancets MicroThin 33G MISC 1 each by Does not apply route 2 (two) times daily. 100 each 2   simvastatin (ZOCOR) 40 MG tablet Take 1 tablet (40 mg total) by mouth at bedtime. 90 tablet 0   Vitamin D, Ergocalciferol, (DRISDOL) 1.25 MG (50000 UNIT) CAPS capsule Take 1 capsule (50,000 Units total) by mouth every 7 (seven) days. 5 capsule 2   No facility-administered medications prior to visit.    Allergies  Allergen Reactions   Meloxicam Other (See Comments)   Rosuvastatin     Muscle pain    Review of Systems  Constitutional:  Positive for appetite change and fatigue. Negative for fever.  HENT:  Negative for congestion, ear pain, sinus pressure and sore throat.   Respiratory:  Negative for cough, chest tightness, shortness of breath and wheezing.   Cardiovascular:  Negative for  chest pain and palpitations.  Gastrointestinal:  Positive for diarrhea, nausea and vomiting. Negative for abdominal pain and constipation.  Genitourinary:  Negative for dysuria and hematuria.  Musculoskeletal:  Negative for arthralgias, back pain, joint swelling and myalgias.  Skin:  Negative for rash.  Neurological:  Negative for dizziness, weakness and headaches.  Psychiatric/Behavioral:  Negative for dysphoric mood. The patient is not nervous/anxious.          Objective:    Physical Exam Vitals reviewed.  Constitutional:      Appearance: Normal appearance. She is normal weight.  HENT:     Right Ear: Tympanic membrane, ear canal and external ear normal.     Left Ear: Tympanic membrane and external ear normal.     Nose: Nose normal.     Mouth/Throat:     Pharynx: Oropharynx is clear.  Cardiovascular:     Rate and Rhythm: Normal rate and regular rhythm.     Heart sounds: Normal heart sounds. No murmur heard. Pulmonary:     Effort: Pulmonary effort is normal. No respiratory distress.     Breath sounds: Normal breath sounds.  Abdominal:     General: Bowel sounds are normal. There is no distension.     Palpations: Abdomen is soft. There is no mass.     Tenderness: There is abdominal tenderness (epigastric tenderness). There is no right CVA tenderness, left CVA tenderness, guarding or rebound.     Comments: Hyperactive bowel sounds  Neurological:     Mental Status: She is alert and oriented to person, place, and time.  Psychiatric:        Mood and Affect: Mood normal.        Behavior: Behavior normal.    BP (!) 140/62 (BP Location: Left Arm, Patient Position: Sitting)   Pulse 72   Temp (!) 97.2 F (36.2 C) (Temporal)   Ht _0  (1.549 m)   Wt 145 lb (65.8 kg)   SpO2 98%   BMI 27.40 kg/m    Wt Readings from Last 3 Encounters:  05/14/22 145 lb (65.8 kg)  04/22/22 145 lb 3.2 oz (65.9 kg)  02/20/22 147 lb (66.7 kg)    Health Maintenance Due  Topic Date Due   DEXA  SCAN  07/21/2019   MAMMOGRAM  07/22/2019   COVID-19 Vaccine (6 - 2023-24 season) 02/14/2022    There are no preventive care reminders to display for this patient.   Lab Results  Component Value Date   TSH 2.830 04/22/2022   Lab Results  Component Value Date   WBC 5.2 04/22/2022   HGB 14.1 04/22/2022   HCT 43.1 04/22/2022   MCV 93 04/22/2022   PLT 176 04/22/2022   Lab Results  Component Value Date   NA 142 04/22/2022   K 4.3 04/22/2022   CO2 24 04/22/2022   GLUCOSE 180 (H) 04/22/2022   BUN 21 04/22/2022   CREATININE 1.28 (H) 04/22/2022   BILITOT 0.9 04/22/2022   ALKPHOS 72 04/22/2022   AST 15 04/22/2022   ALT 11 04/22/2022   PROT 7.0 04/22/2022   ALBUMIN 4.9 (H) 04/22/2022   CALCIUM 10.1 04/22/2022   EGFR 44 (L) 04/22/2022   Lab Results  Component Value Date   CHOL 183 04/22/2022   Lab Results  Component Value Date   HDL 43 04/22/2022   Lab Results  Component Value Date   LDLCALC 108 (H) 04/22/2022   Lab Results  Component Value Date   TRIG 182 (H) 04/22/2022   Lab Results  Component Value Date   CHOLHDL 4.3 04/22/2022   Lab Results  Component Value Date   HGBA1C 7.6 (H) 04/22/2022      Assessment & Plan:   Problem List Items Addressed This Visit       Digestive   Acute gastroenteritis - Primary    Suggested to take BRAT diet for 4-5 days  Sent the prescription for Zofran as needed  Suggested to push fluids  Other   Epigastric pain    Suggested to continue Protonix 40 mg daily (patient was not taking that medicine since last year).  Suggested to stay away from Caffeine, and fried foods.      Follow-up:  as needed  I, Jakell Trusty have reviewed all documentation for this visit. The documentation on 05/28/22   for the exam, diagnosis, procedures, and orders are all accurate and complete.      I,Lauren M Auman,acting as a Education administrator for [employer], FNP.,have documented all relevant documentation on the behalf of Neil Crouch,  FNP,as directed by  Neil Crouch, FNP while in the presence of Neil Crouch, FNP.   An After Visit Summary was printed and given to the patient.  Neil Crouch, DNP, [place] [phone]

## 2022-05-28 NOTE — Assessment & Plan Note (Signed)
Suggested to take BRAT diet for 4-5 days  Sent the prescription for Zofran as needed  Suggested to push fluids

## 2022-06-12 DIAGNOSIS — G5602 Carpal tunnel syndrome, left upper limb: Secondary | ICD-10-CM | POA: Diagnosis not present

## 2022-06-12 DIAGNOSIS — M79642 Pain in left hand: Secondary | ICD-10-CM | POA: Diagnosis not present

## 2022-06-12 DIAGNOSIS — M1812 Unilateral primary osteoarthritis of first carpometacarpal joint, left hand: Secondary | ICD-10-CM | POA: Diagnosis not present

## 2022-06-12 DIAGNOSIS — Z4789 Encounter for other orthopedic aftercare: Secondary | ICD-10-CM | POA: Diagnosis not present

## 2022-06-15 DIAGNOSIS — I10 Essential (primary) hypertension: Secondary | ICD-10-CM | POA: Diagnosis not present

## 2022-06-15 DIAGNOSIS — E1159 Type 2 diabetes mellitus with other circulatory complications: Secondary | ICD-10-CM | POA: Diagnosis not present

## 2022-06-15 DIAGNOSIS — E785 Hyperlipidemia, unspecified: Secondary | ICD-10-CM | POA: Diagnosis not present

## 2022-06-15 DIAGNOSIS — Z7984 Long term (current) use of oral hypoglycemic drugs: Secondary | ICD-10-CM

## 2022-06-22 ENCOUNTER — Other Ambulatory Visit: Payer: Self-pay | Admitting: Family Medicine

## 2022-06-26 DIAGNOSIS — Z96651 Presence of right artificial knee joint: Secondary | ICD-10-CM | POA: Diagnosis not present

## 2022-07-07 ENCOUNTER — Telehealth: Payer: Self-pay

## 2022-07-07 ENCOUNTER — Other Ambulatory Visit: Payer: Self-pay

## 2022-07-07 DIAGNOSIS — F330 Major depressive disorder, recurrent, mild: Secondary | ICD-10-CM

## 2022-07-07 MED ORDER — ICOSAPENT ETHYL 1 G PO CAPS: 2.0000 g | ORAL_CAPSULE | Freq: Two times a day (BID) | ORAL | 3 refills | Status: DC

## 2022-07-07 MED ORDER — ZYRTEC ALLERGY 10 MG PO CAPS: 10.0000 mg | ORAL_CAPSULE | Freq: Every day | ORAL | 3 refills | Status: DC

## 2022-07-07 MED ORDER — FLUOXETINE HCL 20 MG PO CAPS: 40.0000 mg | ORAL_CAPSULE | Freq: Every day | ORAL | 1 refills | Status: DC

## 2022-07-07 MED ORDER — PANTOPRAZOLE SODIUM 40 MG PO TBEC: 40.0000 mg | DELAYED_RELEASE_TABLET | Freq: Every day | ORAL | 3 refills | Status: DC

## 2022-07-07 NOTE — Progress Notes (Signed)
Care Management & Coordination Services Pharmacy Team  Reason for Encounter: Medication coordination and delivery  Contacted patient on 07/07/2022 to discuss medications   Recent office visits:  05-28-2022 Neil Crouch, FNP.FINISHED iron and augmentin. START zofran 4 mg every 8 hours PRN.  Recent consult visits:  06-12-2022 Roseanne Kaufman, MD ([facility]). Follow up visit.  Hospital visits:  None in previous 6 months  Medications: Outpatient Encounter Medications as of 07/07/2022  Medication Sig   Acetaminophen (TYLENOL PO) Take by mouth as needed.   Cetirizine HCl (ZYRTEC ALLERGY) 10 MG CAPS Take 1 capsule (10 mg total) by mouth daily.   Cholecalciferol (VITAMIN D3) 25 MCG (1000 UT) CAPS Take 1 capsule (1,000 Units total) by mouth daily.   dapagliflozin propanediol (FARXIGA) 10 MG TABS tablet Take 1 tablet (10 mg total) by mouth daily before breakfast.   ezetimibe (ZETIA) 10 MG tablet Take 1 tablet (10 mg total) by mouth daily.   FLUoxetine (PROZAC) 20 MG capsule Take 2 capsules (40 mg total) by mouth daily.   glucose blood test strip 1 each by Other route 2 (two) times daily. Use as instructed   icosapent Ethyl (VASCEPA) 1 g capsule Take 2 capsules (2 g total) by mouth 2 (two) times daily.   losartan (COZAAR) 100 MG tablet Take 1 tablet (100 mg total) by mouth daily.   montelukast (SINGULAIR) 10 MG tablet Take 1 tablet (10 mg total) by mouth at bedtime.   ondansetron (ZOFRAN) 4 MG tablet Take 1 tablet (4 mg total) by mouth every 8 (eight) hours as needed for nausea or vomiting.   pantoprazole (PROTONIX) 40 MG tablet Take 1 tablet (40 mg total) by mouth daily.   PX Lancets MicroThin 33G MISC 1 each by Does not apply route 2 (two) times daily.   simvastatin (ZOCOR) 40 MG tablet TAKE 1 TABLET BY MOUTH AT BEDTIME   Vitamin D, Ergocalciferol, (DRISDOL) 1.25 MG (50000 UNIT) CAPS capsule Take 1 capsule (50,000 Units total) by mouth every 7 (seven) days.   No facility-administered  encounter medications on file as of 07/07/2022.   BP Readings from Last 3 Encounters:  05/28/22 (!) 140/62  05/14/22 (!) 140/60  04/22/22 118/62    Pulse Readings from Last 3 Encounters:  05/28/22 72  05/14/22 84  04/22/22 75    Lab Results  Component Value Date/Time   HGBA1C 7.6 (H) 04/22/2022 08:43 AM   HGBA1C 7.6 (H) 01/14/2022 08:28 AM   Lab Results  Component Value Date   CREATININE 1.28 (H) 04/22/2022   BUN 21 04/22/2022   GFRNONAA 41 (L) 05/04/2020   GFRAA 47 (L) 05/04/2020   NA 142 04/22/2022   K 4.3 04/22/2022   CALCIUM 10.1 04/22/2022   CO2 24 04/22/2022     Last adherence delivery date: 04-17-2022     Patient is due for next adherence delivery on: 07-17-2022  Spoke with patient on 07-07-2022 reviewed medications and coordinated delivery.  This delivery to include: Adherence Packaging  90 Days  Ferrous Sulfate '325mg'$  2 at L Vitamin D3 15mg 1 at B Losartan '100mg'$  1 at EM Fluoxetine '20mg'$  2 at B Pantoprazole '40mg'$  1 at B Cetirizine '10mg'$  1 at B Vascepa 1gm 2 at B and 2 at EM  Patient declined the following medications this month: Farxiga Gets PAP Test Strips/Lancets- Pt has plenty of supply, does not need Simvastatin- Gets from [facility] Montelukast- Gets from [facility] Tylenol- OTC   Refills requested from providers include: Fluoxetine Pantoprazole  Vascepa  Cetirizine  Confirmed delivery date of 07-17-2022, advised patient that pharmacy will contact them the morning of delivery.   Any concerns about your medications? No  How often do you forget or accidentally miss a dose? Never  Do you use a pillbox? No  Is patient in packaging Yes  If yes  What is the date on your next pill pack? 07-08-2022  Any concerns or issues with your packaging? None   Recent blood pressure readings are as follows: 140/62 05-28-2022  Recent blood glucose readings are as follows: 7.6 04-22-2022   Cycle dispensing form sent to [facility] for  review.   Alicia Jordan

## 2022-07-08 NOTE — Telephone Encounter (Signed)
Compliant on meds 

## 2022-07-30 ENCOUNTER — Ambulatory Visit (INDEPENDENT_AMBULATORY_CARE_PROVIDER_SITE_OTHER): Payer: PPO | Admitting: Family Medicine

## 2022-07-30 ENCOUNTER — Encounter: Payer: Self-pay | Admitting: Family Medicine

## 2022-07-30 VITALS — BP 122/68 | HR 66 | Temp 97.3°F | Ht 61.0 in | Wt 145.0 lb

## 2022-07-30 DIAGNOSIS — E782 Mixed hyperlipidemia: Secondary | ICD-10-CM | POA: Diagnosis not present

## 2022-07-30 DIAGNOSIS — I129 Hypertensive chronic kidney disease with stage 1 through stage 4 chronic kidney disease, or unspecified chronic kidney disease: Secondary | ICD-10-CM

## 2022-07-30 DIAGNOSIS — E1169 Type 2 diabetes mellitus with other specified complication: Secondary | ICD-10-CM

## 2022-07-30 DIAGNOSIS — E559 Vitamin D deficiency, unspecified: Secondary | ICD-10-CM | POA: Diagnosis not present

## 2022-07-30 DIAGNOSIS — N1832 Chronic kidney disease, stage 3b: Secondary | ICD-10-CM

## 2022-07-30 DIAGNOSIS — K219 Gastro-esophageal reflux disease without esophagitis: Secondary | ICD-10-CM

## 2022-07-30 DIAGNOSIS — E1121 Type 2 diabetes mellitus with diabetic nephropathy: Secondary | ICD-10-CM | POA: Diagnosis not present

## 2022-07-30 DIAGNOSIS — F330 Major depressive disorder, recurrent, mild: Secondary | ICD-10-CM

## 2022-07-30 DIAGNOSIS — E1122 Type 2 diabetes mellitus with diabetic chronic kidney disease: Secondary | ICD-10-CM | POA: Diagnosis not present

## 2022-07-30 DIAGNOSIS — E538 Deficiency of other specified B group vitamins: Secondary | ICD-10-CM | POA: Diagnosis not present

## 2022-07-30 NOTE — Assessment & Plan Note (Signed)
The current medical regimen is effective; continue present plan and medications. Continue Pantoprazole 40 mg 1 tablet once daily.

## 2022-07-30 NOTE — Assessment & Plan Note (Signed)
Continue OTC vitamin B12.

## 2022-07-30 NOTE — Assessment & Plan Note (Addendum)
Continue Vitamin D 50,000 units 1 capsule once weekly. Labs drawn today.

## 2022-07-30 NOTE — Assessment & Plan Note (Signed)
The current medical regimen is effective;  continue present plan and medications. Continue prozac 20 mg 2 daily.

## 2022-07-30 NOTE — Patient Instructions (Signed)
Drink 64 oz. Of water daily with daily fiber supplement to help with Constipation.

## 2022-07-30 NOTE — Progress Notes (Signed)
Subjective:  Patient ID: Alicia Jordan, female    DOB: Mar 29, 1946  Age: 77 y.o. MRN: [id number]  Chief Complaint  Patient presents with   Hyperlipidemia   Diabetes    Diabetes:   Complications: nephropathy. CKD 3b. Checks sugars 2-3 times per week Log: 169-191 Checking feet daily.  Last HbA1c: 7.6 Eye exam: September 2023. Has cataracts. No polydipsia, no polyphagia, no polyuria.  Medications: Farxiga 10 mg daily.    Hyperlipidemia:  Taking Simvastatin 40 mg daily, Zetia 10 mg daily, and not taking Vascepa because insurance would not pay for this.  Hypertension associated with CKD:  Losartan 100 mg daily.    Depression/Anxiety: on prozac 20 mg 2 daily. Well controlled.   GERD: Pantoprazole 40 mg daily.  Vitamin D deficiency: Vitamin D 50,000 units 1 capsule once weekly.      07/30/2022    8:42 AM 05/14/2022    9:31 AM 02/20/2022    9:04 AM 01/14/2022    7:48 AM 07/06/2021   12:41 PM  Depression screen PHQ 2/9  Decreased Interest 0 0 0 0 0  Down, Depressed, Hopeless 0 0 0 0 0  PHQ - 2 Score 0 0 0 0 0  Altered sleeping 0 0 0 0 1  Tired, decreased energy 1 2 1 1 1  $ Change in appetite 0 1 0 0 1  Feeling bad or failure about yourself  0 0 0 0 0  Trouble concentrating 0 0 0 0 0  Moving slowly or fidgety/restless 0 0 0 0 0  Suicidal thoughts 0 0 0 0 0  PHQ-9 Score 1 3 1 1 3  $ Difficult doing work/chores Not difficult at all Not difficult at all Not difficult at all Not difficult at all Not difficult at all         02/01/2021   11:49 AM 01/14/2022    7:47 AM 02/20/2022    9:04 AM 05/14/2022    9:31 AM 07/30/2022    8:42 AM  Fall Risk  Falls in the past year?  0 0 0 0  Was there an injury with Fall?  0 0 0 0  Fall Risk Category Calculator  0 0 0 0  Fall Risk Category (Retired)  Low Low Low   (RETIRED) Patient Fall Risk Level Low fall risk Low fall risk Low fall risk Low fall risk   Patient at Risk for Falls Due to  No Fall Risks No Fall Risks History of fall(s) No  Fall Risks  Fall risk Follow up  Falls evaluation completed Falls evaluation completed;Education provided;Falls prevention discussed Falls evaluation completed Falls evaluation completed      Review of Systems  Constitutional:  Negative for appetite change, fatigue and fever.  HENT:  Positive for rhinorrhea. Negative for congestion, ear pain, sinus pressure and sore throat.   Respiratory:  Negative for cough, chest tightness, shortness of breath and wheezing.   Cardiovascular:  Negative for chest pain and palpitations.  Gastrointestinal:  Positive for constipation. Negative for abdominal pain, diarrhea, nausea and vomiting.  Genitourinary:  Negative for dysuria and hematuria.  Musculoskeletal:  Positive for myalgias (Left arm/forearm). Negative for arthralgias, back pain and joint swelling.  Skin:  Negative for rash.  Neurological:  Negative for dizziness, weakness and headaches.  Psychiatric/Behavioral:  Negative for dysphoric mood. The patient is not nervous/anxious.     Current Outpatient Medications on File Prior to Visit  Medication Sig Dispense Refill   Acetaminophen (TYLENOL PO) Take by mouth as needed.  Cetirizine HCl (ZYRTEC ALLERGY) 10 MG CAPS Take 1 capsule (10 mg total) by mouth daily. 90 capsule 3   Cholecalciferol (VITAMIN D3) 25 MCG (1000 UT) CAPS Take 1 capsule (1,000 Units total) by mouth daily. 180 capsule 3   FLUoxetine (PROZAC) 20 MG capsule Take 2 capsules (40 mg total) by mouth daily. 180 capsule 1   glucose blood test strip 1 each by Other route 2 (two) times daily. Use as instructed 100 each 2   losartan (COZAAR) 100 MG tablet Take 1 tablet (100 mg total) by mouth daily. 90 tablet 1   montelukast (SINGULAIR) 10 MG tablet Take 1 tablet (10 mg total) by mouth at bedtime. 90 tablet 3   ondansetron (ZOFRAN) 4 MG tablet Take 1 tablet (4 mg total) by mouth every 8 (eight) hours as needed for nausea or vomiting. 20 tablet 0   pantoprazole (PROTONIX) 40 MG tablet Take 1  tablet (40 mg total) by mouth daily. 90 tablet 3   PX Lancets MicroThin 33G MISC 1 each by Does not apply route 2 (two) times daily. 100 each 2   simvastatin (ZOCOR) 40 MG tablet TAKE 1 TABLET BY MOUTH AT BEDTIME 90 tablet 0   vitamin B-12 (CYANOCOBALAMIN) 100 MCG tablet Take 100 mcg by mouth daily.     Vitamin D, Ergocalciferol, (DRISDOL) 1.25 MG (50000 UNIT) CAPS capsule Take 1 capsule (50,000 Units total) by mouth every 7 (seven) days. 5 capsule 2   No current facility-administered medications on file prior to visit.   Past Medical History:  Diagnosis Date   Allergy    Anemia    Anxiety    Arthralgia of left temporomandibular joint    Arthritis    Cervicalgia    Chronic kidney disease, stage 3a (HCC)    Depression    Diabetes mellitus without complication ([place])    Hesitancy of micturition    Hyperlipidemia    Hypertension    Osteoporosis    Other psoriasis    Other specified menopausal and perimenopausal disorders    Overweight    Repeated falls    Residual hemorrhoidal skin tags    Past Surgical History:  Procedure Laterality Date   ABDOMINAL HYSTERECTOMY     APPENDECTOMY     CARPAL TUNNEL RELEASE Right    CHOLECYSTECTOMY     COLONOSCOPY  04/09/2016   Mild sigmoid diverticulosis. Otherwise normal colonoscopy   ESOPHAGOGASTRODUODENOSCOPY  09/10/2012   No obvious esophageal stricture, status post empiric esophageal dilation. Irregular GE junction, questionable significance. Mild gastritis.   eye lid surgery     KNEE SURGERY Left    REPLACEMENT TOTAL KNEE Right 06/03/2021    Family History  Problem Relation Age of Onset   Diabetes type II Mother    Hyperlipidemia Mother    Hypertension Mother    Dementia Mother    Heart attack Father    Hyperlipidemia Father    Hypertension Father    Stroke Father    Diabetes type II Father    Colon cancer Daughter 52   Cancer Daughter    Esophageal cancer Neg Hx    Rectal cancer Neg Hx    Stomach cancer Neg Hx    Social  History   Socioeconomic History   Marital status: Married    Spouse name: Joneen Boers   Number of children: 4   Years of education: Not on file   Highest education level: Not on file  Occupational History   Occupation: Homemaker  Tobacco Use  Smoking status: Never   Smokeless tobacco: Never  Vaping Use   Vaping Use: Never used  Substance and Sexual Activity   Alcohol use: Never   Drug use: Never   Sexual activity: Not on file  Other Topics Concern   Not on file  Social History Narrative   Katrinka spends a lot if time helping her daughter get to and from oncology appointments   Her husband has started drinking alcohol again   Social Determinants of Health   Financial Resource Strain: High Risk (05/20/2022)   Overall Financial Resource Strain (CARDIA)    Difficulty of Paying Living Expenses: Hard  Food Insecurity: No Food Insecurity (04/03/2020)   Hunger Vital Sign    Worried About Running Out of Food in the Last Year: Never true    [place] in the Last Year: Never true  Transportation Needs: No Transportation Needs (05/20/2022)   PRAPARE - Hydrologist (Medical): No    Lack of Transportation (Non-Medical): No  Physical Activity: Not on file  Stress: Stress Concern Present (09/06/2020)   [place]    Feeling of Stress : To some extent  Social Connections: Not on file    Objective:  BP 122/68 (BP Location: Left Arm, Patient Position: Sitting)   Pulse 66   Temp (!) 97.3 F (36.3 C) (Temporal)   Ht 5' 1"$  (1.549 m)   Wt 145 lb (65.8 kg)   SpO2 98%   BMI 27.40 kg/m      07/30/2022    8:41 AM 05/28/2022    8:53 AM 05/14/2022    9:25 AM  BP/Weight  Systolic BP [id number]  Diastolic BP 68 62 60  Wt. (Lbs) 145 145 145  BMI 27.4 kg/m2 27.4 kg/m2 27.4 kg/m2    Physical Exam Vitals reviewed.  Constitutional:      Appearance: Normal appearance. She is normal weight.   Cardiovascular:     Rate and Rhythm: Normal rate and regular rhythm.     Heart sounds: Normal heart sounds.  Pulmonary:     Effort: Pulmonary effort is normal.     Breath sounds: Normal breath sounds.  Abdominal:     General: Abdomen is flat. Bowel sounds are normal.     Palpations: Abdomen is soft.  Musculoskeletal:     Left forearm: Swelling (left radial carpal area) and tenderness present.  Neurological:     Mental Status: She is alert and oriented to person, place, and time.  Psychiatric:        Mood and Affect: Mood normal.        Behavior: Behavior normal.     Diabetic Foot Exam - Simple   Simple Foot Form  07/30/2022 10:24 PM  Visual Inspection No deformities, no ulcerations, no other skin breakdown bilaterally: Yes Sensation Testing Intact to touch and monofilament testing bilaterally: Yes Pulse Check Posterior Tibialis and Dorsalis pulse intact bilaterally: Yes Comments      Lab Results  Component Value Date   WBC 6.5 07/30/2022   HGB 14.8 07/30/2022   HCT 45.8 07/30/2022   PLT 162 07/30/2022   GLUCOSE 191 (H) 07/30/2022   CHOL 127 07/30/2022   TRIG 103 07/30/2022   HDL 49 07/30/2022   LDLCALC 59 07/30/2022   ALT 15 07/30/2022   AST 18 07/30/2022   NA 142 07/30/2022   K 4.9 07/30/2022   CL 103 07/30/2022   CREATININE 1.58 (H)  07/30/2022   BUN 30 (H) 07/30/2022   CO2 25 07/30/2022   TSH 2.830 04/22/2022   INR 1.0 12/14/2020   HGBA1C 8.2 (H) 07/30/2022   MICROALBUR 30 03/29/2021      Assessment & Plan:    Mixed hyperlipidemia Assessment & Plan: Well controlled.  No changes to medicines.  Continue to work on eating a healthy diet and exercise.  Labs drawn today.    Orders: -     CBC With Diff/Platelet -     Comprehensive metabolic panel -     Lipid panel  B12 deficiency Assessment & Plan: Continue OTC vitamin B12.   GERD without esophagitis Assessment & Plan: The current medical regimen is effective; continue present plan and  medications. Continue Pantoprazole 40 mg 1 tablet once daily.   Diabetes mellitus with stage 3 chronic kidney disease (HCC) Assessment & Plan: Control: good Recommend check sugars fasting daily. Recommend check feet daily. Recommend annual eye exams. Medicines: Continue Farxiga 10 mg daily.  Continue to work on eating a healthy diet and exercise.  Labs drawn today.      Diabetic glomerulopathy (HCC) Assessment & Plan: Control: good Recommend check sugars fasting daily. Recommend check feet daily. Recommend annual eye exams. Medicines: Continue Farxiga 10 mg daily. Continue to work on eating a healthy diet and exercise.  Labs drawn today.    Orders: -     Hemoglobin A1c -     Microalbumin / creatinine urine ratio  Mild recurrent major depression (HCC) Assessment & Plan: The current medical regimen is effective;  continue present plan and medications. Continue prozac 20 mg 2 daily.   Vitamin D deficiency Assessment & Plan: Continue Vitamin D 50,000 units 1 capsule once weekly. Labs drawn today.  Orders: -     VITAMIN D 25 Hydroxy (Vit-D Deficiency, Fractures)  Hypertensive kidney disease with stage 3b chronic kidney disease ([place]) Assessment & Plan: Well controlled.  No changes to medicines.  Continue Losartan 100 mg daily.   Continue to work on eating a healthy diet and exercise.  Labs drawn today.     Other orders -     Cardiovascular Risk Assessment     No orders of the defined types were placed in this encounter.   Orders Placed This Encounter  Procedures   CBC With Diff/Platelet   Comprehensive metabolic panel   Lipid panel   Hemoglobin A1c   Microalbumin / creatinine urine ratio   VITAMIN D 25 Hydroxy (Vit-D Deficiency, Fractures)   Cardiovascular Risk Assessment     Follow-up: Return in about 3 months (around 10/28/2022) for Fasting, chronic.   I,Lauren M Auman,acting as a scribe for [name], MD.,have documented all relevant  documentation on the behalf of [name], MD,as directed by  [name], MD while in the presence of [name], MD.   An After Visit Summary was printed and given to the patient.  I attest that I have reviewed this visit and agree with the plan scribed by my staff.   [name], MD [facility] [phone]

## 2022-07-30 NOTE — Assessment & Plan Note (Signed)
Well controlled.  ?No changes to medicines.  ?Continue to work on eating a healthy diet and exercise.  ?Labs drawn today.  ?

## 2022-07-30 NOTE — Assessment & Plan Note (Addendum)
Control: good Recommend check sugars fasting daily. Recommend check feet daily. Recommend annual eye exams. Medicines: Continue Farxiga 10 mg daily. Continue to work on eating a healthy diet and exercise.  Labs drawn today.

## 2022-07-31 ENCOUNTER — Telehealth: Payer: Self-pay

## 2022-07-31 ENCOUNTER — Other Ambulatory Visit: Payer: Self-pay

## 2022-07-31 LAB — CBC WITH DIFF/PLATELET
Basophils Absolute: 0.1 10*3/uL (ref 0.0–0.2)
Basos: 1 %
EOS (ABSOLUTE): 0.2 10*3/uL (ref 0.0–0.4)
Eos: 3 %
Hematocrit: 45.8 % (ref 34.0–46.6)
Hemoglobin: 14.8 g/dL (ref 11.1–15.9)
Immature Grans (Abs): 0 10*3/uL (ref 0.0–0.1)
Immature Granulocytes: 0 %
Lymphocytes Absolute: 1.6 10*3/uL (ref 0.7–3.1)
Lymphs: 24 %
MCH: 30.1 pg (ref 26.6–33.0)
MCHC: 32.3 g/dL (ref 31.5–35.7)
MCV: 93 fL (ref 79–97)
Monocytes Absolute: 0.3 10*3/uL (ref 0.1–0.9)
Monocytes: 5 %
Neutrophils Absolute: 4.3 10*3/uL (ref 1.4–7.0)
Neutrophils: 67 %
Platelets: 162 10*3/uL (ref 150–450)
RBC: 4.92 x10E6/uL (ref 3.77–5.28)
RDW: 12.9 % (ref 11.7–15.4)
WBC: 6.5 10*3/uL (ref 3.4–10.8)

## 2022-07-31 LAB — COMPREHENSIVE METABOLIC PANEL
ALT: 15 IU/L (ref 0–32)
AST: 18 IU/L (ref 0–40)
Albumin/Globulin Ratio: 2.4 — ABNORMAL HIGH (ref 1.2–2.2)
Albumin: 5.1 g/dL — ABNORMAL HIGH (ref 3.8–4.8)
Alkaline Phosphatase: 74 IU/L (ref 44–121)
BUN/Creatinine Ratio: 19 (ref 12–28)
BUN: 30 mg/dL — ABNORMAL HIGH (ref 8–27)
Bilirubin Total: 1.3 mg/dL — ABNORMAL HIGH (ref 0.0–1.2)
CO2: 25 mmol/L (ref 20–29)
Calcium: 10 mg/dL (ref 8.7–10.3)
Chloride: 103 mmol/L (ref 96–106)
Creatinine, Ser: 1.58 mg/dL — ABNORMAL HIGH (ref 0.57–1.00)
Globulin, Total: 2.1 g/dL (ref 1.5–4.5)
Glucose: 191 mg/dL — ABNORMAL HIGH (ref 70–99)
Potassium: 4.9 mmol/L (ref 3.5–5.2)
Sodium: 142 mmol/L (ref 134–144)
Total Protein: 7.2 g/dL (ref 6.0–8.5)
eGFR: 34 mL/min/{1.73_m2} — ABNORMAL LOW (ref >59)

## 2022-07-31 LAB — MICROALBUMIN / CREATININE URINE RATIO
Creatinine, Urine: 131.9 mg/dL
Microalb/Creat Ratio: 5 mg/g creat (ref 0–29)
Microalbumin, Urine: 6.6 ug/mL

## 2022-07-31 LAB — LIPID PANEL
Chol/HDL Ratio: 2.6 ratio (ref 0.0–4.4)
Cholesterol, Total: 127 mg/dL (ref 100–199)
HDL: 49 mg/dL (ref >39)
LDL Chol Calc (NIH): 59 mg/dL (ref 0–99)
Triglycerides: 103 mg/dL (ref 0–149)
VLDL Cholesterol Cal: 19 mg/dL (ref 5–40)

## 2022-07-31 LAB — HEMOGLOBIN A1C
Est. average glucose Bld gHb Est-mCnc: 189 mg/dL
Hgb A1c MFr Bld: 8.2 % — ABNORMAL HIGH (ref 4.8–5.6)

## 2022-07-31 LAB — VITAMIN D 25 HYDROXY (VIT D DEFICIENCY, FRACTURES): Vit D, 25-Hydroxy: 45.5 ng/mL (ref 30.0–100.0)

## 2022-07-31 LAB — CARDIOVASCULAR RISK ASSESSMENT

## 2022-07-31 NOTE — Progress Notes (Signed)
Blood count normal.  Liver function normal.  Kidney function abnormal, but fairly stable Cholesterol: Great. Improved! HBA1C: 8.2.  Not at goal.  Currently on farxiga 10 mg daily.  Recommend start on Rybelsus 3 mg daily x 30 days, then increase to 7 mg milligrams daily. History Vitamin D improved. Continue D supplementation. Not spilling any protein in urine.

## 2022-07-31 NOTE — Progress Notes (Cosign Needed)
Re-enrolled patient through [facility] for [employer], patient apporvedNicholes Rough ID [id number]  REFERENCE NUMBER [place] Hypercholesterolemia - Medicare Access  ASSISTANCE TYPE Co-pay  START DATE 07/21/2022  END DATE 07/21/2023  Patient also needs a refill of Farxiga sent to [facility], patient receives through [place] patient assistance program, request sent to clinical team.   Pattricia Boss, [place] Pharmacist Assistant [phone]

## 2022-08-01 ENCOUNTER — Other Ambulatory Visit: Payer: Self-pay

## 2022-08-01 MED ORDER — EZETIMIBE 10 MG PO TABS: 10.0000 mg | ORAL_TABLET | Freq: Every day | ORAL | 1 refills | Status: DC

## 2022-08-01 MED ORDER — DAPAGLIFLOZIN PROPANEDIOL 10 MG PO TABS: 10.0000 mg | ORAL_TABLET | Freq: Every day | ORAL | 3 refills | Status: DC

## 2022-08-01 NOTE — Assessment & Plan Note (Signed)
Control: good Recommend check sugars fasting daily. Recommend check feet daily. Recommend annual eye exams. Medicines: Continue Farxiga 10 mg daily.  Continue to work on eating a healthy diet and exercise.  Labs drawn today.

## 2022-08-02 NOTE — Assessment & Plan Note (Signed)
Well controlled.  No changes to medicines.  Continue Losartan 100 mg daily.   Continue to work on eating a healthy diet and exercise.  Labs drawn today.

## 2022-08-06 DIAGNOSIS — M79642 Pain in left hand: Secondary | ICD-10-CM | POA: Diagnosis not present

## 2022-08-06 DIAGNOSIS — M1812 Unilateral primary osteoarthritis of first carpometacarpal joint, left hand: Secondary | ICD-10-CM | POA: Diagnosis not present

## 2022-08-06 DIAGNOSIS — G5602 Carpal tunnel syndrome, left upper limb: Secondary | ICD-10-CM | POA: Diagnosis not present

## 2022-08-06 DIAGNOSIS — Z4789 Encounter for other orthopedic aftercare: Secondary | ICD-10-CM | POA: Diagnosis not present

## 2022-08-15 ENCOUNTER — Other Ambulatory Visit: Payer: Self-pay

## 2022-08-15 MED ORDER — RYBELSUS 3 MG PO TABS: 3.0000 mg | ORAL_TABLET | Freq: Every day | ORAL | 0 refills | Status: DC

## 2022-09-04 DIAGNOSIS — G5602 Carpal tunnel syndrome, left upper limb: Secondary | ICD-10-CM | POA: Diagnosis not present

## 2022-09-04 DIAGNOSIS — M1812 Unilateral primary osteoarthritis of first carpometacarpal joint, left hand: Secondary | ICD-10-CM | POA: Diagnosis not present

## 2022-09-04 DIAGNOSIS — S56912A Strain of unspecified muscles, fascia and tendons at forearm level, left arm, initial encounter: Secondary | ICD-10-CM | POA: Diagnosis not present

## 2022-09-12 ENCOUNTER — Telehealth: Payer: Self-pay

## 2022-09-12 NOTE — Progress Notes (Cosign Needed)
Filling out [employer] patient assistance application for Rybelsus. Placing in the mail to patient next week, contacting patient to inform.   Pattricia Boss, [place] Pharmacist Assistant [phone]

## 2022-09-16 ENCOUNTER — Other Ambulatory Visit: Payer: Self-pay | Admitting: Family Medicine

## 2022-09-16 MED ORDER — RYBELSUS 7 MG PO TABS
7.0000 mg | ORAL_TABLET | Freq: Every day | ORAL | 0 refills | Status: DC
Start: 2022-09-16 — End: 2022-11-05

## 2022-09-29 DIAGNOSIS — M5442 Lumbago with sciatica, left side: Secondary | ICD-10-CM | POA: Diagnosis not present

## 2022-09-29 DIAGNOSIS — M5136 Other intervertebral disc degeneration, lumbar region: Secondary | ICD-10-CM | POA: Diagnosis not present

## 2022-09-29 DIAGNOSIS — M9904 Segmental and somatic dysfunction of sacral region: Secondary | ICD-10-CM | POA: Diagnosis not present

## 2022-09-29 DIAGNOSIS — M9903 Segmental and somatic dysfunction of lumbar region: Secondary | ICD-10-CM | POA: Diagnosis not present

## 2022-10-02 ENCOUNTER — Telehealth: Payer: Self-pay

## 2022-10-02 NOTE — Progress Notes (Cosign Needed)
Patient completed and returned [employer] patient assistance application for Rybelsus, retrieving providers signature and faxing to [employer].   Billee Cashing, CMA Clinical Pharmacist Assistant [phone]

## 2022-10-06 ENCOUNTER — Other Ambulatory Visit: Payer: Self-pay

## 2022-10-06 ENCOUNTER — Telehealth: Payer: Self-pay

## 2022-10-06 MED ORDER — LOSARTAN POTASSIUM 100 MG PO TABS: 100.0000 mg | ORAL_TABLET | Freq: Every day | ORAL | 1 refills | Status: DC

## 2022-10-06 MED ORDER — VITAMIN D (ERGOCALCIFEROL) 1.25 MG (50000 UNIT) PO CAPS: 50000.0000 [IU] | ORAL_CAPSULE | ORAL | 2 refills | Status: DC

## 2022-10-06 NOTE — Progress Notes (Signed)
Care Management & Coordination Services Pharmacy Team  Reason for Encounter: Medication coordination and delivery  Contacted patient to discuss medications and coordinate delivery from [facility]. Spoke with patient on 10/06/2022  Cycle dispensing form sent to Artelia Laroche for review.   Last adherence delivery date: 07-17-2022  Patient is due for next adherence delivery on: 10-15-2022  This delivery to include: Adherence Packaging  90 Days  Ferrous Sulfate  2 at L Vitamin D3 1 at B Losartan  1 at EM Fluoxetine  2 at B Pantoprazole  1 at B Cetirizine  1 at B Vascepa 1gm 2 at B and 2 at EM  Patient declined the following medications this month: Rybelsus- Filled 09-17-2022 90 DS [facility]. PAP in process Farxiga- PAP Zetia- Filled 08-01-2022 [facility] DS [facility] Simvastatin- Filled 09-17-2022 90 DS [facility] Zofran- PRN  Refills requested from providers include: Losartan  Vitamin D3   Confirmed delivery date of 10-15-2022, advised patient that pharmacy will contact them the morning of delivery.   Any concerns about your medications? No  How often do you forget or accidentally miss a dose? Never  Do you use a pillbox? No  Is patient in packaging Yes  If yes  What is the date on your next pill pack? 10-07-2022  Any concerns or issues with your packaging? No   Recent blood pressure readings are as follows:Patient doesn't check  Recent blood glucose readings are as follows: Fasting 04-22 139, 04-21 151, 04-20 154, 04-19 157   Chart review: Recent office visits:  07-30-2022 Blane Ohara, MD. Glucose= 191, BUN= 30, Creatinine= 1.58, eGFR= 34, Albumin= 5.1, Albumin/Globulin Ratio= 2.4, Bilirubin Total = 1.3. A1C= 8.2  Recent consult visits:  09-04-2022 Bland Span, MD ([facility]). Follow up visit for Carpal tunnel in left hand.  08-06-2022 Bland Span, MD ([facility]). Follow up visit for Carpal tunnel in left  hand. Hospital visits:  None in previous 6 months  Medications: Outpatient Encounter Medications as of 10/06/2022  Medication Sig   Acetaminophen (TYLENOL PO) Take by mouth as needed.   Cetirizine HCl (ZYRTEC ALLERGY) 10 MG CAPS Take 1 capsule (10 mg total) by mouth daily.   Cholecalciferol (VITAMIN D3) 25 MCG (1000 UT) CAPS Take 1 capsule (1,000 Units total) by mouth daily.   dapagliflozin propanediol (FARXIGA) 10 MG TABS tablet Take 1 tablet (10 mg total) by mouth daily before breakfast.   ezetimibe (ZETIA) 10 MG tablet Take 1 tablet (10 mg total) by mouth daily.   FLUoxetine (PROZAC) 20 MG capsule Take 2 capsules (40 mg total) by mouth daily.   glucose blood test strip 1 each by Other route 2 (two) times daily. Use as instructed   losartan (COZAAR) 100 MG tablet Take 1 tablet (100 mg total) by mouth daily.   montelukast (SINGULAIR) 10 MG tablet Take 1 tablet (10 mg total) by mouth at bedtime.   ondansetron (ZOFRAN) 4 MG tablet Take 1 tablet (4 mg total) by mouth every 8 (eight) hours as needed for nausea or vomiting.   pantoprazole (PROTONIX) 40 MG tablet Take 1 tablet (40 mg total) by mouth daily.   PX Lancets MicroThin 33G MISC 1 each by Does not apply route 2 (two) times daily.   Semaglutide (RYBELSUS) 7 MG TABS Take 1 tablet (7 mg total) by mouth daily.   simvastatin (ZOCOR) 40 MG tablet TAKE 1 TABLET BY MOUTH AT BEDTIME   vitamin B-12 (CYANOCOBALAMIN) 100 MCG tablet Take 100 mcg by mouth daily.   Vitamin D,  Ergocalciferol, (DRISDOL) 1.25 MG (50000 UNIT) CAPS capsule Take 1 capsule (50,000 Units total) by mouth every 7 (seven) days.   No facility-administered encounter medications on file as of 10/06/2022.   BP Readings from Last 3 Encounters:  07/30/22 122/68  05/28/22 (!) 140/62  05/14/22 (!) 140/60    Pulse Readings from Last 3 Encounters:  07/30/22 66  05/28/22 72  05/14/22 84    Lab Results  Component Value Date/Time   HGBA1C 8.2 (H) 07/30/2022 09:22 AM   HGBA1C 7.6  (H) 04/22/2022 08:43 AM   Lab Results  Component Value Date   CREATININE 1.58 (H) 07/30/2022   BUN 30 (H) 07/30/2022   GFRNONAA 41 (L) 05/04/2020   GFRAA 47 (L) 05/04/2020   NA 142 07/30/2022   K 4.9 07/30/2022   CALCIUM 10.0 07/30/2022   CO2 25 07/30/2022    Malecca Hicks CMA Clinical Pharmacist Assistant [phone]

## 2022-10-07 ENCOUNTER — Telehealth: Payer: Self-pay

## 2022-10-07 NOTE — Telephone Encounter (Signed)
Patient called and stated that she has been nauseated and has felt sick to her stomach since last Thursday and states that its nothing that she has been eating and it comes and goes at different times of the day. She also states she is having upper abdominal pain along with belching and indigestion. She believes it has to do with her rybelsus. Consulted with Dr. Sedalia Muta patient is to stop the Rybelsus and monitor fasting sugars for the next few days and see if symptoms resolve. Patient had also mentioned constipation and was recommeneded to take something OTC for constipation too as well and to call us back and let us know about how her sugars are doing and if her symptoms resolve. Patient understood verbally and states she will call us back and let us know.

## 2022-10-13 ENCOUNTER — Ambulatory Visit (INDEPENDENT_AMBULATORY_CARE_PROVIDER_SITE_OTHER): Payer: PPO | Admitting: Family Medicine

## 2022-10-13 DIAGNOSIS — U071 COVID-19: Secondary | ICD-10-CM | POA: Diagnosis not present

## 2022-10-13 DIAGNOSIS — J069 Acute upper respiratory infection, unspecified: Secondary | ICD-10-CM | POA: Diagnosis not present

## 2022-10-13 MED ORDER — NIRMATRELVIR/RITONAVIR (PAXLOVID) TABLET (RENAL DOSING)
2.0000 | ORAL_TABLET | Freq: Two times a day (BID) | ORAL | 0 refills | Status: AC
Start: 2022-10-13 — End: 2022-10-18

## 2022-10-13 NOTE — Assessment & Plan Note (Signed)
Your COVID test is positive. You should remain isolated and quarantine for at least 5 days from start of symptoms. You must be feeling better and be fever free without any fever reducers for at least 24 hours as well. You should wear a mask at all times when out of your home or around others for 5 days after leaving isolation.  If you feel worse or have increasing shortness of breath, you should be seen in person at urgent care or the emergency room.   Rx: paxlovid. Hold simvastatin.  Recommend rest, fluids, 3 meals per day.  Fever reducing medicines.  OTC cold and congestion medicines.   

## 2022-10-13 NOTE — Patient Instructions (Signed)
Your COVID test is positive. You should remain isolated and quarantine for at least 5 days from start of symptoms. You must be feeling better and be fever free without any fever reducers for at least 24 hours as well. You should wear a mask at all times when out of your home or around others for 5 days after leaving isolation.  If you feel worse or have increasing shortness of breath, you should be seen in person at urgent care or the emergency room.   Rx: paxlovid. Hold simvastatin.  Recommend rest, fluids, 3 meals per day.  Fever reducing medicines.  OTC cold and congestion medicines.

## 2022-10-14 ENCOUNTER — Telehealth: Payer: Self-pay

## 2022-10-14 NOTE — Progress Notes (Cosign Needed)
"  [employer] is needing her proof of income for household, they are asking for federal tax return"  10/14/22- Called pt and they stated they do not file Federal tax returns. The only income they have is [employer] and that is what they brought up along with their application. They have not filed a federal return in a very long time.   Roxana Hires, CMA Clinical Pharmacist Assistant  [phone]

## 2022-10-19 ENCOUNTER — Encounter: Payer: Self-pay | Admitting: Family Medicine

## 2022-10-19 LAB — POC COVID19 BINAXNOW: SARS Coronavirus 2 Ag: NEGATIVE

## 2022-10-19 NOTE — Progress Notes (Signed)
Acute Office Visit  Subjective:    Patient ID: Alicia Jordan, female    DOB: 1945-09-27, 77 y.o.   MRN: [id number]  Chief Complaint  Patient presents with   URI   HPI: Patient is in today for URI symptoms. Complaining of fatigue, shortness of breath, coughing, sinus congestion x 2 days. No fever or chills. No chest pain. Poor appetite. Not drinking well. Not eating. Patient's covid 19 test is positive.   Past Medical History:  Diagnosis Date   Allergy    Anemia    Anxiety    Arthralgia of left temporomandibular joint    Arthritis    Cervicalgia    Chronic kidney disease, stage 3a (HCC)    Depression    Diabetes mellitus without complication (HCC)    Hesitancy of micturition    Hyperlipidemia    Hypertension    Osteoporosis    Other psoriasis    Other specified menopausal and perimenopausal disorders    Overweight    Repeated falls    Residual hemorrhoidal skin tags     Past Surgical History:  Procedure Laterality Date   ABDOMINAL HYSTERECTOMY     APPENDECTOMY     CARPAL TUNNEL RELEASE Right    CHOLECYSTECTOMY     COLONOSCOPY  04/09/2016   Mild sigmoid diverticulosis. Otherwise normal colonoscopy   ESOPHAGOGASTRODUODENOSCOPY  09/10/2012   No obvious esophageal stricture, status post empiric esophageal dilation. Irregular GE junction, questionable significance. Mild gastritis.   eye lid surgery     KNEE SURGERY Left    REPLACEMENT TOTAL KNEE Right 06/03/2021    Family History  Problem Relation Age of Onset   Diabetes type II Mother    Hyperlipidemia Mother    Hypertension Mother    Dementia Mother    Heart attack Father    Hyperlipidemia Father    Hypertension Father    Stroke Father    Diabetes type II Father    Colon cancer Daughter 71   Cancer Daughter    Esophageal cancer Neg Hx    Rectal cancer Neg Hx    Stomach cancer Neg Hx     Social History   Socioeconomic History   Marital status: Married    Spouse name: Jake Shark   Number of  children: 4   Years of education: Not on file   Highest education level: Not on file  Occupational History   Occupation: Homemaker  Tobacco Use   Smoking status: Never   Smokeless tobacco: Never  Vaping Use   Vaping Use: Never used  Substance and Sexual Activity   Alcohol use: Never   Drug use: Never   Sexual activity: Not on file  Other Topics Concern   Not on file  Social History Narrative   Terez spends a lot if time helping her daughter get to and from oncology appointments   Her husband has started drinking alcohol again   Social Determinants of Health   Financial Resource Strain: High Risk (05/20/2022)   Overall Financial Resource Strain (CARDIA)    Difficulty of Paying Living Expenses: Hard  Food Insecurity: No Food Insecurity (04/03/2020)   Hunger Vital Sign    Worried About Running Out of Food in the Last Year: Never true    Ran Out of Food in the Last Year: Never true  Transportation Needs: No Transportation Needs (05/20/2022)   PRAPARE - Administrator, Civil Service (Medical): No    Lack of Transportation (Non-Medical): No  Physical Activity: Not  on file  Stress: Stress Concern Present (09/06/2020)   [employer] of Occupational Health - Occupational Stress Questionnaire    Feeling of Stress : To some extent  Social Connections: Not on file  Intimate Partner Violence: Not At Risk (09/06/2020)   Humiliation, Afraid, Rape, and Kick questionnaire    Fear of Current or Ex-Partner: No    Emotionally Abused: No    Physically Abused: No    Sexually Abused: No    Outpatient Medications Prior to Visit  Medication Sig Dispense Refill   Acetaminophen (TYLENOL PO) Take by mouth as needed.     Cetirizine HCl (ZYRTEC ALLERGY) 10 MG CAPS Take 1 capsule (10 mg total) by mouth daily. 90 capsule 3   Cholecalciferol (VITAMIN D3) 25 MCG (1000 UT) CAPS Take 1 capsule (1,000 Units total) by mouth daily. 180 capsule 3   dapagliflozin propanediol (FARXIGA) 10 MG  TABS tablet Take 1 tablet (10 mg total) by mouth daily before breakfast. 90 tablet 3   ezetimibe (ZETIA) 10 MG tablet Take 1 tablet (10 mg total) by mouth daily. 90 tablet 1   FLUoxetine (PROZAC) 20 MG capsule Take 2 capsules (40 mg total) by mouth daily. 180 capsule 1   glucose blood test strip 1 each by Other route 2 (two) times daily. Use as instructed 100 each 2   losartan (COZAAR) 100 MG tablet Take 1 tablet (100 mg total) by mouth daily. 90 tablet 1   montelukast (SINGULAIR) 10 MG tablet Take 1 tablet (10 mg total) by mouth at bedtime. 90 tablet 3   ondansetron (ZOFRAN) 4 MG tablet Take 1 tablet (4 mg total) by mouth every 8 (eight) hours as needed for nausea or vomiting. 20 tablet 0   pantoprazole (PROTONIX) 40 MG tablet Take 1 tablet (40 mg total) by mouth daily. 90 tablet 3   PX Lancets MicroThin 33G MISC 1 each by Does not apply route 2 (two) times daily. 100 each 2   Semaglutide (RYBELSUS) 7 MG TABS Take 1 tablet (7 mg total) by mouth daily. 90 tablet 0   simvastatin (ZOCOR) 40 MG tablet TAKE 1 TABLET BY MOUTH AT BEDTIME 90 tablet 0   vitamin B-12 (CYANOCOBALAMIN) 100 MCG tablet Take 100 mcg by mouth daily.     Vitamin D, Ergocalciferol, (DRISDOL) 1.25 MG (50000 UNIT) CAPS capsule Take 1 capsule (50,000 Units total) by mouth every 7 (seven) days. 5 capsule 2   No facility-administered medications prior to visit.    Allergies  Allergen Reactions   Meloxicam Other (See Comments)   Rosuvastatin     Muscle pain    Review of Systems  Constitutional:  Positive for fatigue. Negative for chills and fever.  HENT:  Positive for congestion and sinus pain. Negative for ear pain and sore throat.   Respiratory:  Positive for cough and shortness of breath.   Cardiovascular:  Negative for chest pain.  Neurological:  Positive for headaches.       Objective:        07/30/2022    8:41 AM 05/28/2022    8:53 AM 05/14/2022    9:25 AM  Vitals with BMI  Height 5\' 1"  5\' 1"  5\' 1"   Weight  145 lbs 145 lbs 145 lbs  BMI 27.41 27.41 27.41  Systolic 122 140 161  Diastolic 68 62 60  Pulse 66 72 84    No data found.   Limited exam at the car.  Physical Exam Vitals reviewed.  Constitutional:  Appearance: She is ill-appearing (pale).  Pulmonary:     Effort: Pulmonary effort is normal.     Breath sounds: Normal breath sounds.  Neurological:     Mental Status: She is alert.     Health Maintenance Due  Topic Date Due   DEXA SCAN  07/21/2019   MAMMOGRAM  07/22/2019   COVID-19 Vaccine (6 - 2023-24 season) 02/14/2022    There are no preventive care reminders to display for this patient.   Lab Results  Component Value Date   TSH 2.830 04/22/2022   Lab Results  Component Value Date   WBC 6.5 07/30/2022   HGB 14.8 07/30/2022   HCT 45.8 07/30/2022   MCV 93 07/30/2022   PLT 162 07/30/2022   Lab Results  Component Value Date   NA 142 07/30/2022   K 4.9 07/30/2022   CO2 25 07/30/2022   GLUCOSE 191 (H) 07/30/2022   BUN 30 (H) 07/30/2022   CREATININE 1.58 (H) 07/30/2022   BILITOT 1.3 (H) 07/30/2022   ALKPHOS 74 07/30/2022   AST 18 07/30/2022   ALT 15 07/30/2022   PROT 7.2 07/30/2022   ALBUMIN 5.1 (H) 07/30/2022   CALCIUM 10.0 07/30/2022   EGFR 34 (L) 07/30/2022   Lab Results  Component Value Date   CHOL 127 07/30/2022   Lab Results  Component Value Date   HDL 49 07/30/2022   Lab Results  Component Value Date   LDLCALC 59 07/30/2022   Lab Results  Component Value Date   TRIG 103 07/30/2022   Lab Results  Component Value Date   CHOLHDL 2.6 07/30/2022   Lab Results  Component Value Date   HGBA1C 8.2 (H) 07/30/2022       Assessment & Plan:  Lab test positive for detection of COVID-19 virus -     POC COVID-19 BinaxNow -     nirmatrelvir/ritonavir (renal dosing); Take 2 tablets by mouth 2 (two) times daily for 5 days. (Take nirmatrelvir 150 mg one tablet twice daily for 5 days and ritonavir 100 mg one tablet twice daily for 5 days)  Patient GFR is 35  Dispense: 20 tablet; Refill: 0  Upper respiratory tract infection due to COVID-19 virus Assessment & Plan: Your COVID test is positive. You should remain isolated and quarantine for at least 5 days from start of symptoms. You must be feeling better and be fever free without any fever reducers for at least 24 hours as well. You should wear a mask at all times when out of your home or around others for 5 days after leaving isolation.  If you feel worse or have increasing shortness of breath, you should be seen in person at urgent care or the emergency room.   Rx: paxlovid. Hold simvastatin.  Recommend rest, fluids, 3 meals per day.  Fever reducing medicines.  OTC cold and congestion medicines.    Orders: -     POC COVID-19 BinaxNow -     nirmatrelvir/ritonavir (renal dosing); Take 2 tablets by mouth 2 (two) times daily for 5 days. (Take nirmatrelvir 150 mg one tablet twice daily for 5 days and ritonavir 100 mg one tablet twice daily for 5 days) Patient GFR is 35  Dispense: 20 tablet; Refill: 0     Meds ordered this encounter  Medications   nirmatrelvir/ritonavir, renal dosing, (PAXLOVID) 10 x 150 MG & 10 x 100MG  TABS    Sig: Take 2 tablets by mouth 2 (two) times daily for 5 days. (Take nirmatrelvir 150 mg  one tablet twice daily for 5 days and ritonavir 100 mg one tablet twice daily for 5 days) Patient GFR is 35    Dispense:  20 tablet    Refill:  0    Orders Placed This Encounter  Procedures   POC COVID-19     Follow-up: Return if symptoms worsen or fail to improve.  An After Visit Summary was printed and given to the patient.  Blane Ohara, MD [facility] [phone]

## 2022-10-20 ENCOUNTER — Telehealth: Payer: Self-pay

## 2022-10-20 NOTE — Progress Notes (Signed)
Care Management & Coordination Services Pharmacy Team  Reason for Encounter: Appointment Reminder  Contacted patient to confirm telephone appointment with Artelia Laroche, PharmD on 10/22/22 at 9:00 am.  Spoke with patient on 10/20/2022   Do you have any problems getting your medications? Yes If yes what types of problems are you experiencing? Financial barriers, [employer] needed their federal return to continue processing her application for [employer] but they do not file returns so this is still in progress to get assistance.   What is your top health concern you would like to discuss at your upcoming visit? Denied any top concerns   Have you seen any other providers since your last visit with PCP? No   Chart review:  Recent office visits:  10/13/22 Blane Ohara MD. Seen for URI. Rx: paxlovid 10x150mg  for 5 days. Hold simvastatin.   Recent consult visits:  None  Hospital visits:  None   Star Rating Drugs:  Medication:  Last Fill: Day Supply Farxiga (PAP)   LF last week 90ds Losartan  10/13/22-07/15/22 90ds Semaglutide   09/17/22-08/18/22 90/30ds Simvastatin   09/17/22-06/23/22 90ds   Care Gaps: Annual wellness visit in last year? Yes Mammogram:Overdue since  07/22/19 Dexa Scan: Overdue since 07/21/19   Roxana Hires, CMA Clinical Pharmacist Assistant  [phone]

## 2022-10-22 ENCOUNTER — Ambulatory Visit: Payer: PPO

## 2022-10-22 NOTE — Progress Notes (Signed)
Care Management & Coordination Services Pharmacy Note  10/22/2022 Name:  Alicia Jordan MRN:  [id number] DOB:  1945-08-13   Plan Updates:  Team has been working on getting Rybelsus approved for PAP. Patient told me today, "I can't take Rybelsus, it makes me sick." Will remove Rybelsus from medslist and ask PCP for alternative medication (A1c is above 8 and sugar readings consistently above 150 at home)    Subjective: Alicia Jordan is an 77 y.o. year old female who is a primary patient of Cox, Kirsten, MD.  The care coordination team was consulted for assistance with disease management and care coordination needs.    Engaged with patient by telephone for follow up visit.  Recent office visits:  10/13/22 Blane Ohara MD. Seen for URI. Rx: paxlovid 10x150mg  for 5 days. Hold simvastatin.    Recent consult visits:  None   Hospital visits:  None   Objective:  Lab Results  Component Value Date   CREATININE 1.58 (H) 07/30/2022   BUN 30 (H) 07/30/2022   EGFR 34 (L) 07/30/2022   GFRNONAA 41 (L) 05/04/2020   GFRAA 47 (L) 05/04/2020   NA 142 07/30/2022   K 4.9 07/30/2022   CALCIUM 10.0 07/30/2022   CO2 25 07/30/2022   GLUCOSE 191 (H) 07/30/2022    Lab Results  Component Value Date/Time   HGBA1C 8.2 (H) 07/30/2022 09:22 AM   HGBA1C 7.6 (H) 04/22/2022 08:43 AM   MICROALBUR 30 03/29/2021 08:59 AM   MICROALBUR 30 05/04/2020 09:13 AM    Last diabetic Eye exam:  Lab Results  Component Value Date/Time   HMDIABEYEEXA No Retinopathy 02/19/2022 12:00 AM    Last diabetic Foot exam: No results found for: "HMDIABFOOTEX"   Lab Results  Component Value Date   CHOL 127 07/30/2022   HDL 49 07/30/2022   LDLCALC 59 07/30/2022   TRIG 103 07/30/2022   CHOLHDL 2.6 07/30/2022       Latest Ref Rng & Units 07/30/2022    9:22 AM 04/22/2022    8:43 AM 01/14/2022    8:28 AM  Hepatic Function  Total Protein 6.0 - 8.5 g/dL 7.2  7.0  7.0   Albumin 3.8 - 4.8 g/dL 5.1  4.9  4.7   AST 0 - 40  IU/L 18  15  18    ALT 0 - 32 IU/L 15  11  14    Alk Phosphatase 44 - 121 IU/L 74  72  67   Total Bilirubin 0.0 - 1.2 mg/dL 1.3  0.9  1.0     Lab Results  Component Value Date/Time   TSH 2.830 04/22/2022 08:43 AM   TSH 1.890 07/04/2021 09:49 AM   FREET4 1.31 07/04/2021 09:49 AM       Latest Ref Rng & Units 07/30/2022    9:22 AM 04/22/2022    8:43 AM 01/14/2022    8:28 AM  CBC  WBC 3.4 - 10.8 x10E3/uL 6.5  5.2  5.5   Hemoglobin 11.1 - 15.9 g/dL 40.9  81.1  91.4   Hematocrit 34.0 - 46.6 % 45.8  43.1  40.2   Platelets 150 - 450 x10E3/uL 162  176  161     Lab Results  Component Value Date/Time   VD25OH 45.5 07/30/2022 09:25 AM   VD25OH 28.3 (L) 04/22/2022 08:43 AM   VITAMINB12 365 04/22/2022 08:43 AM   VITAMINB12 249 03/29/2021 09:10 AM    Clinical ASCVD: Yes  The ASCVD Risk score (Arnett DK, et al., 2019) failed to  calculate for the following reasons:   The valid total cholesterol range is 130 to 320 mg/dL    Other: ([id number] if Afib, MMRC or CAT for COPD, ACT, DEXA)     07/30/2022    8:42 AM 05/14/2022    9:31 AM 02/20/2022    9:04 AM  Depression screen PHQ 2/9  Decreased Interest 0 0 0  Down, Depressed, Hopeless 0 0 0  PHQ - 2 Score 0 0 0  Altered sleeping 0 0 0  Tired, decreased energy 1 2 1   Change in appetite 0 1 0  Feeling bad or failure about yourself  0 0 0  Trouble concentrating 0 0 0  Moving slowly or fidgety/restless 0 0 0  Suicidal thoughts 0 0 0  PHQ-9 Score 1 3 1   Difficult doing work/chores Not difficult at all Not difficult at all Not difficult at all     Social History   Tobacco Use  Smoking Status Never  Smokeless Tobacco Never   BP Readings from Last 3 Encounters:  07/30/22 122/68  05/28/22 (!) 140/62  05/14/22 (!) 140/60   Pulse Readings from Last 3 Encounters:  07/30/22 66  05/28/22 72  05/14/22 84   Wt Readings from Last 3 Encounters:  07/30/22 145 lb (65.8 kg)  05/28/22 145 lb (65.8 kg)  05/14/22 145 lb (65.8 kg)   BMI  Readings from Last 3 Encounters:  07/30/22 27.40 kg/m  05/28/22 27.40 kg/m  05/14/22 27.40 kg/m    Allergies  Allergen Reactions   Meloxicam Other (See Comments)   Rosuvastatin     Muscle pain    Medications Reviewed Today     Reviewed by Blane Ohara, MD (Physician) on 10/19/22 at 1451  Med List Status: <None>   Medication Order Taking? Sig Documenting Provider Last Dose Status Informant  Acetaminophen (TYLENOL PO) [id number] No Take by mouth as needed. [provider] Taking Active   Cetirizine HCl (ZYRTEC ALLERGY) 10 MG CAPS [id number] No Take 1 capsule (10 mg total) by mouth daily. Cox, Kirsten, MD Taking Active   Cholecalciferol (VITAMIN D3) 25 MCG (1000 UT) CAPS [id number] No Take 1 capsule (1,000 Units total) by mouth daily. Cox, Kirsten, MD Taking Active   dapagliflozin propanediol (FARXIGA) 10 MG TABS tablet [id number]  Take 1 tablet (10 mg total) by mouth daily before breakfast. Cox, Kirsten, MD  Active   ezetimibe (ZETIA) 10 MG tablet [id number]  Take 1 tablet (10 mg total) by mouth daily. Cox, Kirsten, MD  Active   FLUoxetine (PROZAC) 20 MG capsule [id number] No Take 2 capsules (40 mg total) by mouth daily. Cox, Kirsten, MD Taking Active   glucose blood test strip [id number] No 1 each by Other route 2 (two) times daily. Use as instructed Abigail Miyamoto, MD Taking Active   losartan (COZAAR) 100 MG tablet [id number] No Take 1 tablet (100 mg total) by mouth daily. Cox, Kirsten, MD Taking Active   montelukast (SINGULAIR) 10 MG tablet [id number] No Take 1 tablet (10 mg total) by mouth at bedtime. Cox, Kirsten, MD Taking Active   ondansetron [facility]) 4 MG tablet [id number] No Take 1 tablet (4 mg total) by mouth every 8 (eight) hours as needed for nausea or vomiting. Lurline Del, FNP Taking Active   pantoprazole (PROTONIX) 40 MG tablet [id number] No Take 1 tablet (40 mg total) by mouth daily. Blane Ohara, MD Taking Active   PX Lancets MicroThin 33G [place] [id number] No 1  each by Does not apply route 2 (two) times  daily. Abigail Miyamoto, MD Taking Active   Semaglutide [facility]) 7 MG TABS [id number]  Take 1 tablet (7 mg total) by mouth daily. Cox, Kirsten, MD  Active   simvastatin (ZOCOR) 40 MG tablet [id number]  TAKE 1 TABLET BY MOUTH AT BEDTIME Cox, Kirsten, MD  Active   vitamin B-12 (CYANOCOBALAMIN) 100 MCG tablet [id number] No Take 100 mcg by mouth daily. [provider] Taking Active   Vitamin D, Ergocalciferol, (DRISDOL) 1.25 MG (50000 UNIT) CAPS capsule [id number] No Take 1 capsule (50,000 Units total) by mouth every 7 (seven) days. Cox, Fritzi Mandes, MD Taking Active             SDOH:  (Social Determinants of Health) assessments and interventions performed: Yes SDOH Interventions    Flowsheet Row Office Visit from 07/30/2022 in [facility] Chronic Care Management from 05/20/2022 in [facility] from 01/14/2022 in [facility] Care Management from 08/06/2021 in [facility] Office Visit from 07/04/2021 in [facility] Office Visit from 03/29/2021 in [facility]  SDOH Interventions        Transportation Interventions -- Intervention Not Indicated Intervention Not Indicated Intervention Not Indicated -- --  Depression Interventions/Treatment  Currently on Treatment -- -- -- Currently on Treatment Currently on Treatment  Financial Strain Interventions -- Other (Comment)  [PAP] Other (Comment)  [PAP] Intervention Not Indicated -- --       Medication Assistance: (See note)   Name and location of Current pharmacy:  [facility] [address] [place] - [address] [place] [address] Phone: [phone] Fax: [phone]  [facility] - [place], [place] - [address] [place] [address] Phone: [phone] Fax: [phone]  [facility] -  [place], [place] - [address] [place] [address] Phone: [phone] Fax: [phone]    Compliance/Adherence/Medication fill history: Star Rating Drugs:  Medication:                Last Fill:         Day Supply Farxiga (PAP)                         LF last week 90ds Losartan                      10/13/22-07/15/22 90ds Semaglutide                09/17/22-08/18/22 90/30ds Simvastatin                 09/17/22-06/23/22 90ds     Care Gaps: Annual wellness visit in last year? Yes Mammogram:Overdue since  07/22/19 Dexa Scan: Overdue since 07/21/19   Assessment/Plan   Hypertension (BP goal <130/80) BP Readings from Last 3 Encounters:  07/30/22 122/68  05/28/22 (!) 140/62  05/14/22 (!) 140/60    Pulse Readings from Last 3 Encounters:  07/30/22 66  05/28/22 72  05/14/22 84  -Controlled. Diagnosed >25 years ago.  -Current treatment: Losartan 100 mg daily Appropriate, Effective, Safe, Accessible -Medications previously tried: Losartan-hydrochlorothiazide -Current home readings: Doesn't check at home -Current dietary habits: eats lunch out and working to [employer] -Current exercise habits: [place] [employer] -Denies hypotensive/hypertensive symptoms -Educated on BP goals  and benefits of medications for prevention of heart attack, stroke and kidney damage; Daily salt intake goal < 2300 mg; Exercise goal of 150 minutes per week; -Counseled to monitor BP at home as needed, document, and provide log at future appointments -Counseled on diet and exercise extensively December 2023: Patient's BP elevated, will most likely fail BP metric for the year because of this (Normally good but she's sick at visit). Will ask PCP about having nurse visit with patient May 2024: Patient didn't have BP readings    Hyperlipidemia: (LDL goal < 70) The ASCVD Risk score (Arnett DK, et al., 2019) failed to calculate for the following  reasons:   The valid total cholesterol range is 130 to 320 mg/dL Lab Results  Component Value Date   CHOL 127 07/30/2022   CHOL 183 04/22/2022   CHOL 149 01/14/2022   Lab Results  Component Value Date   HDL 49 07/30/2022   HDL 43 04/22/2022   HDL 44 01/14/2022   Lab Results  Component Value Date   LDLCALC 59 07/30/2022   LDLCALC 108 (H) 04/22/2022   LDLCALC 78 01/14/2022   Lab Results  Component Value Date   TRIG 103 07/30/2022   TRIG 182 (H) 04/22/2022   TRIG 156 (H) 01/14/2022   Lab Results  Component Value Date   CHOLHDL 2.6 07/30/2022   CHOLHDL 4.3 04/22/2022   CHOLHDL 3.4 01/14/2022   No results found for: "LDLDIRECT" Last vitamin D Lab Results  Component Value Date   VD25OH 45.5 07/30/2022   Lab Results  Component Value Date   TSH 2.830 04/22/2022   -Not ideally controlled -Current treatment: Simvastatin 40mg  Appropriate, Effective, Safe, Accessible Ezetimibe 10mg  Appropriate, Effective, Safe, Accessible -Medications previously tried: Rosuvastatin -Current dietary patterns: eats out at lunch. Trying to make smarter decisions -Current exercise habits: [place] [employer] -Educated on Cholesterol goals; Benefits of statin for ASCVD risk reduction; Importance of limiting foods high in cholesterol; Exercise goal of 150 minutes per week; -Counseled on diet and exercise extensively Feb 2023: Recommend Atorvastatin in case renal function worsens. Patient not taking Vascepa due to fears of it being a "High dose." Used metaphor explaining how 1000 feathers still weighs less than 100 bricks and that you can't determine how "strong" a med is based on the mg December 2023: Maintain current therapy per PCP. Will re-do Vascepa grant Recommend maintain therapy   Diabetes (A1c goal <7%) Lab Results  Component Value Date   HGBA1C 8.2 (H) 07/30/2022   HGBA1C 7.6 (H) 04/22/2022   HGBA1C 7.6 (H) 01/14/2022   Lab Results  Component Value Date    MICROALBUR 30 03/29/2021   LDLCALC 59 07/30/2022   CREATININE 1.58 (H) 07/30/2022    Lab Results  Component Value Date   NA 142 07/30/2022   K 4.9 07/30/2022   CREATININE 1.58 (H) 07/30/2022   EGFR 34 (L) 07/30/2022   GFRNONAA 41 (L) 05/04/2020   GLUCOSE 191 (H) 07/30/2022    Lab Results  Component Value Date   WBC 6.5 07/30/2022   HGB 14.8 07/30/2022   HCT 45.8 07/30/2022   MCV 93 07/30/2022   PLT 162 07/30/2022    Lab Results  Component Value Date   LABMICR 6.6 07/30/2022   LABMICR <3.0 10/03/2021   MICROALBUR 30 03/29/2021   MICROALBUR 30 05/04/2020  -Not ideally controlled -Current medications: Farxiga 10mg  Appropriate, Query effective,  2023: Approved 2024: Approved -Medications previously tried: Doran Heater, Rybelsus (GI Issues) -  Current home glucose readings fasting glucose:  December 2023: Patient not testing at home. Counseled to start and to write down May 2024: 10/22/22: 173 164 181 154 171 183 143 130 136 145 132 216 139 141 154 160 139 151 154 157 162 150 167 169 168  185 156 164 163 177 169 162 172 162 163  post prandial glucose: not checking -Denies hypoglycemic/hyperglycemic symptoms -Current meal patterns: Trying to make smart choices. Eats out with daughter during lunch some and sandwich or simple lunch other days.Cereal for breakfast.  -Current exercise: Had [place] [employer] but unable to currently due to anemia.  -Educated on A1c and blood sugar goals; Complications of diabetes including kidney damage, retinal damage, and cardiovascular disease; Exercise goal of 150 minutes per week; Benefits of weight loss; Benefits of routine self-monitoring of blood sugar; Carbohydrate counting and/or plate method -Counseled to check feet daily and get yearly eye exams -Counseled on diet and exercise extensively August 2023: Patient was at the grocery store when I called. She forgot  about the visit. She stated she's having trouble paying for [place]. I asked why because we filled out PAP for her in December 2022. She does not remember that. Counseled her to be home and will reschedule appt ASAP. December 2023: PCP note in past had something about Rybelsus but patient unable to afford. Patient most likely would qualify for PAP. Most recent note from PCP had Pioglitazone recommendation, although that wasn't sent. Will ask PCP about Rybelsus PAP.  -Farxiga PAP auto-renewed for 2024 per patient but she needs refill script. Coordinate with CCM team to get sent from [facility] pool May 2024: Sugars elevated. We've been trying to complete Rybelsus PAP but have had trouble/delays with patient submitting. She told me it's because she took Rybelsus in the past and it bothered her stomach. Will ask PCP for alternative recommendation  CP F/U June 2024  Artelia Laroche, [place].D. - [phone]

## 2022-10-27 DIAGNOSIS — M9904 Segmental and somatic dysfunction of sacral region: Secondary | ICD-10-CM | POA: Diagnosis not present

## 2022-10-27 DIAGNOSIS — M5442 Lumbago with sciatica, left side: Secondary | ICD-10-CM | POA: Diagnosis not present

## 2022-10-27 DIAGNOSIS — M9903 Segmental and somatic dysfunction of lumbar region: Secondary | ICD-10-CM | POA: Diagnosis not present

## 2022-10-27 DIAGNOSIS — M5136 Other intervertebral disc degeneration, lumbar region: Secondary | ICD-10-CM | POA: Diagnosis not present

## 2022-11-04 NOTE — Assessment & Plan Note (Signed)
Control: good Recommend check sugars fasting daily. Recommend check feet daily. Recommend annual eye exams. Medicines: Continue Farxiga 10 mg daily. Continue to work on eating a healthy diet and exercise.  Labs drawn today.

## 2022-11-04 NOTE — Progress Notes (Unsigned)
Subjective:  Patient ID: Alicia Jordan, female    DOB: 09-22-1945  Age: 77 y.o. MRN: [id number]  No chief complaint on file.   HPI   Diabetes:   Complications: nephropathy. CKD 3b. Checks sugars 2-3 times per week Log: 169-191 Checking feet daily.  Last HbA1c: 7.6 Eye exam: September 2023. Has cataracts. No polydipsia, no polyphagia, no polyuria.  Medications: Farxiga 10 mg daily.    Hyperlipidemia:  Taking Simvastatin 40 mg daily, Zetia 10 mg daily, and not taking Vascepa because insurance would not pay for this.  Hypertension associated with CKD:  Losartan 100 mg daily.    Depression/Anxiety: on prozac 20 mg 2 daily. Well controlled.   GERD: Pantoprazole 40 mg daily.  Vitamin D deficiency: Vitamin D 50,000 units 1 capsule once weekly.     07/30/2022    8:42 AM 05/14/2022    9:31 AM 02/20/2022    9:04 AM 01/14/2022    7:48 AM 07/06/2021   12:41 PM  Depression screen PHQ 2/9  Decreased Interest 0 0 0 0 0  Down, Depressed, Hopeless 0 0 0 0 0  PHQ - 2 Score 0 0 0 0 0  Altered sleeping 0 0 0 0 1  Tired, decreased energy 1 2 1 1 1   Change in appetite 0 1 0 0 1  Feeling bad or failure about yourself  0 0 0 0 0  Trouble concentrating 0 0 0 0 0  Moving slowly or fidgety/restless 0 0 0 0 0  Suicidal thoughts 0 0 0 0 0  PHQ-9 Score 1 3 1 1 3   Difficult doing work/chores Not difficult at all Not difficult at all Not difficult at all Not difficult at all Not difficult at all        07/30/2022    8:42 AM  Fall Risk   Falls in the past year? 0  Number falls in past yr: 0  Injury with Fall? 0  Risk for fall due to : No Fall Risks  Follow up Falls evaluation completed    Patient Care Team: Blane Ohara, MD as PCP - General (Family Medicine) Lynann Bologna, MD as Consulting Physician (Gastroenterology) Linda Hedges, MD as Referring Physician (Orthopedic Surgery) Dannielle Huh, MD as Consulting Physician (Orthopedic Surgery) Zettie Pho, [facility] (Pharmacist)    Review of Systems  Current Outpatient Medications on File Prior to Visit  Medication Sig Dispense Refill   Acetaminophen (TYLENOL PO) Take by mouth as needed.     Cetirizine HCl (ZYRTEC ALLERGY) 10 MG CAPS Take 1 capsule (10 mg total) by mouth daily. 90 capsule 3   Cholecalciferol (VITAMIN D3) 25 MCG (1000 UT) CAPS Take 1 capsule (1,000 Units total) by mouth daily. 180 capsule 3   dapagliflozin propanediol (FARXIGA) 10 MG TABS tablet Take 1 tablet (10 mg total) by mouth daily before breakfast. 90 tablet 3   ezetimibe (ZETIA) 10 MG tablet Take 1 tablet (10 mg total) by mouth daily. 90 tablet 1   FLUoxetine (PROZAC) 20 MG capsule Take 2 capsules (40 mg total) by mouth daily. 180 capsule 1   glucose blood test strip 1 each by Other route 2 (two) times daily. Use as instructed 100 each 2   losartan (COZAAR) 100 MG tablet Take 1 tablet (100 mg total) by mouth daily. 90 tablet 1   montelukast (SINGULAIR) 10 MG tablet Take 1 tablet (10 mg total) by mouth at bedtime. 90 tablet 3   ondansetron (ZOFRAN) 4 MG tablet Take 1 tablet (  4 mg total) by mouth every 8 (eight) hours as needed for nausea or vomiting. 20 tablet 0   pantoprazole (PROTONIX) 40 MG tablet Take 1 tablet (40 mg total) by mouth daily. 90 tablet 3   PX Lancets MicroThin 33G MISC 1 each by Does not apply route 2 (two) times daily. 100 each 2   Semaglutide (RYBELSUS) 7 MG TABS Take 1 tablet (7 mg total) by mouth daily. 90 tablet 0   simvastatin (ZOCOR) 40 MG tablet TAKE 1 TABLET BY MOUTH AT BEDTIME 90 tablet 0   vitamin B-12 (CYANOCOBALAMIN) 100 MCG tablet Take 100 mcg by mouth daily.     Vitamin D, Ergocalciferol, (DRISDOL) 1.25 MG (50000 UNIT) CAPS capsule Take 1 capsule (50,000 Units total) by mouth every 7 (seven) days. 5 capsule 2   No current facility-administered medications on file prior to visit.   Past Medical History:  Diagnosis Date   Allergy    Anemia    Anxiety    Arthralgia of left temporomandibular joint    Arthritis     Cervicalgia    Chronic kidney disease, stage 3a (HCC)    Depression    Diabetes mellitus without complication (HCC)    Hesitancy of micturition    Hyperlipidemia    Hypertension    Osteoporosis    Other psoriasis    Other specified menopausal and perimenopausal disorders    Overweight    Repeated falls    Residual hemorrhoidal skin tags    Past Surgical History:  Procedure Laterality Date   ABDOMINAL HYSTERECTOMY     APPENDECTOMY     CARPAL TUNNEL RELEASE Right    CHOLECYSTECTOMY     COLONOSCOPY  04/09/2016   Mild sigmoid diverticulosis. Otherwise normal colonoscopy   ESOPHAGOGASTRODUODENOSCOPY  09/10/2012   No obvious esophageal stricture, status post empiric esophageal dilation. Irregular GE junction, questionable significance. Mild gastritis.   eye lid surgery     KNEE SURGERY Left    REPLACEMENT TOTAL KNEE Right 06/03/2021    Family History  Problem Relation Age of Onset   Diabetes type II Mother    Hyperlipidemia Mother    Hypertension Mother    Dementia Mother    Heart attack Father    Hyperlipidemia Father    Hypertension Father    Stroke Father    Diabetes type II Father    Colon cancer Daughter 52   Cancer Daughter    Esophageal cancer Neg Hx    Rectal cancer Neg Hx    Stomach cancer Neg Hx    Social History   Socioeconomic History   Marital status: Married    Spouse name: Alicia Jordan   Number of children: 4   Years of education: Not on file   Highest education level: Not on file  Occupational History   Occupation: Homemaker  Tobacco Use   Smoking status: Never   Smokeless tobacco: Never  Vaping Use   Vaping Use: Never used  Substance and Sexual Activity   Alcohol use: Never   Drug use: Never   Sexual activity: Not on file  Other Topics Concern   Not on file  Social History Narrative   Alicia Jordan spends a lot if time helping her daughter get to and from oncology appointments   Her husband has started drinking alcohol again   Social Determinants  of Health   Financial Resource Strain: High Risk (05/20/2022)   Overall Financial Resource Strain (CARDIA)    Difficulty of Paying Living Expenses: Hard  Food Insecurity:  No Food Insecurity (04/03/2020)   Hunger Vital Sign    Worried About Running Out of Food in the Last Year: Never true    Ran Out of Food in the Last Year: Never true  Transportation Needs: No Transportation Needs (05/20/2022)   PRAPARE - Administrator, Civil Service (Medical): No    Lack of Transportation (Non-Medical): No  Physical Activity: Not on file  Stress: Stress Concern Present (09/06/2020)   [employer] of Occupational Health - Occupational Stress Questionnaire    Feeling of Stress : To some extent  Social Connections: Not on file    Objective:  There were no vitals taken for this visit.     07/30/2022    8:41 AM 05/28/2022    8:53 AM 05/14/2022    9:25 AM  BP/Weight  Systolic BP 122 140 140  Diastolic BP 68 62 60  Wt. (Lbs) 145 145 145  BMI 27.4 kg/m2 27.4 kg/m2 27.4 kg/m2    Physical Exam  Diabetic Foot Exam - Simple   No data filed      Lab Results  Component Value Date   WBC 6.5 07/30/2022   HGB 14.8 07/30/2022   HCT 45.8 07/30/2022   PLT 162 07/30/2022   GLUCOSE 191 (H) 07/30/2022   CHOL 127 07/30/2022   TRIG 103 07/30/2022   HDL 49 07/30/2022   LDLCALC 59 07/30/2022   ALT 15 07/30/2022   AST 18 07/30/2022   NA 142 07/30/2022   K 4.9 07/30/2022   CL 103 07/30/2022   CREATININE 1.58 (H) 07/30/2022   BUN 30 (H) 07/30/2022   CO2 25 07/30/2022   TSH 2.830 04/22/2022   INR 1.0 12/14/2020   HGBA1C 8.2 (H) 07/30/2022   MICROALBUR 30 03/29/2021      Assessment & Plan:    GERD without esophagitis  Diabetic glomerulopathy (HCC) Assessment & Plan: Control: good Recommend check sugars fasting daily. Recommend check feet daily. Recommend annual eye exams. Medicines: Continue Farxiga 10 mg daily. Continue to work on eating a healthy diet and exercise.   Labs drawn today.     Hypertensive kidney disease with stage 3b chronic kidney disease (HCC) Assessment & Plan: Well controlled.  No changes to medicines.  Continue Losartan 100 mg daily.   Continue to work on eating a healthy diet and exercise.  Labs drawn today.     Mixed hyperlipidemia Assessment & Plan: Well controlled.  No changes to medicines.  Continue to work on eating a healthy diet and exercise.  Labs drawn today.        No orders of the defined types were placed in this encounter.   No orders of the defined types were placed in this encounter.    Follow-up: No follow-ups on file.   I,Marla I Leal-Borjas,acting as a scribe for Blane Ohara, MD.,have documented all relevant documentation on the behalf of Blane Ohara, MD,as directed by  Blane Ohara, MD while in the presence of Blane Ohara, MD.   An After Visit Summary was printed and given to the patient.  Blane Ohara, MD [facility] [phone]

## 2022-11-04 NOTE — Assessment & Plan Note (Signed)
Well controlled.  No changes to medicines. Continue Simvastatin 40 mg daily, Zetia 10 mg daily. Vascepa is too expensive.  Continue to work on eating a healthy diet and exercise.  Labs drawn today.

## 2022-11-04 NOTE — Assessment & Plan Note (Signed)
Well controlled.  No changes to medicines.  Continue Losartan 100 mg daily.   Continue to work on eating a healthy diet and exercise.  Labs drawn today.   

## 2022-11-04 NOTE — Assessment & Plan Note (Signed)
The current medical regimen is effective; continue present plan and medications. Continue Pantoprazole 40 mg 1 tablet once daily.

## 2022-11-05 ENCOUNTER — Ambulatory Visit (INDEPENDENT_AMBULATORY_CARE_PROVIDER_SITE_OTHER): Payer: PPO | Admitting: Family Medicine

## 2022-11-05 ENCOUNTER — Encounter: Payer: Self-pay | Admitting: Family Medicine

## 2022-11-05 VITALS — BP 110/60 | HR 68 | Temp 97.1°F | Resp 16 | Ht 61.0 in | Wt 139.8 lb

## 2022-11-05 DIAGNOSIS — K219 Gastro-esophageal reflux disease without esophagitis: Secondary | ICD-10-CM | POA: Diagnosis not present

## 2022-11-05 DIAGNOSIS — E782 Mixed hyperlipidemia: Secondary | ICD-10-CM | POA: Diagnosis not present

## 2022-11-05 DIAGNOSIS — E1122 Type 2 diabetes mellitus with diabetic chronic kidney disease: Secondary | ICD-10-CM

## 2022-11-05 DIAGNOSIS — E1121 Type 2 diabetes mellitus with diabetic nephropathy: Secondary | ICD-10-CM

## 2022-11-05 DIAGNOSIS — N1832 Chronic kidney disease, stage 3b: Secondary | ICD-10-CM | POA: Diagnosis not present

## 2022-11-05 DIAGNOSIS — I129 Hypertensive chronic kidney disease with stage 1 through stage 4 chronic kidney disease, or unspecified chronic kidney disease: Secondary | ICD-10-CM

## 2022-11-05 MED ORDER — LOSARTAN POTASSIUM 100 MG PO TABS: 100.0000 mg | ORAL_TABLET | Freq: Every day | ORAL | 1 refills | Status: DC

## 2022-11-05 MED ORDER — FAMOTIDINE 20 MG PO TABS
20.0000 mg | ORAL_TABLET | Freq: Every day | ORAL | 0 refills | Status: DC
Start: 2022-11-05 — End: 2023-01-26

## 2022-11-05 MED ORDER — SITAGLIPTIN PHOSPHATE 50 MG PO TABS
50.0000 mg | ORAL_TABLET | Freq: Every day | ORAL | 0 refills | Status: DC
Start: 2022-11-05 — End: 2022-12-04

## 2022-11-05 NOTE — Patient Instructions (Signed)
Recommend add januvia 50 mg daily.  Continue farxiga 10 mg daily.

## 2022-11-06 ENCOUNTER — Other Ambulatory Visit: Payer: Self-pay

## 2022-11-06 DIAGNOSIS — I129 Hypertensive chronic kidney disease with stage 1 through stage 4 chronic kidney disease, or unspecified chronic kidney disease: Secondary | ICD-10-CM

## 2022-11-06 DIAGNOSIS — N1832 Chronic kidney disease, stage 3b: Secondary | ICD-10-CM

## 2022-11-06 LAB — COMPREHENSIVE METABOLIC PANEL
ALT: 19 IU/L (ref 0–32)
AST: 21 IU/L (ref 0–40)
Albumin/Globulin Ratio: 2.2 (ref 1.2–2.2)
Albumin: 4.8 g/dL (ref 3.8–4.8)
Alkaline Phosphatase: 68 IU/L (ref 44–121)
BUN/Creatinine Ratio: 22 (ref 12–28)
BUN: 36 mg/dL — ABNORMAL HIGH (ref 8–27)
Bilirubin Total: 1.1 mg/dL (ref 0.0–1.2)
CO2: 20 mmol/L (ref 20–29)
Calcium: 9.8 mg/dL (ref 8.7–10.3)
Chloride: 103 mmol/L (ref 96–106)
Creatinine, Ser: 1.62 mg/dL — ABNORMAL HIGH (ref 0.57–1.00)
Globulin, Total: 2.2 g/dL (ref 1.5–4.5)
Glucose: 169 mg/dL — ABNORMAL HIGH (ref 70–99)
Potassium: 4.6 mmol/L (ref 3.5–5.2)
Sodium: 142 mmol/L (ref 134–144)
Total Protein: 7 g/dL (ref 6.0–8.5)
eGFR: 33 mL/min/{1.73_m2} — ABNORMAL LOW (ref >59)

## 2022-11-06 LAB — CBC WITH DIFFERENTIAL/PLATELET
Basophils Absolute: 0.1 10*3/uL (ref 0.0–0.2)
Basos: 1 %
EOS (ABSOLUTE): 0.2 10*3/uL (ref 0.0–0.4)
Eos: 3 %
Hematocrit: 41.8 % (ref 34.0–46.6)
Hemoglobin: 13.9 g/dL (ref 11.1–15.9)
Immature Grans (Abs): 0 10*3/uL (ref 0.0–0.1)
Immature Granulocytes: 0 %
Lymphocytes Absolute: 1.5 10*3/uL (ref 0.7–3.1)
Lymphs: 24 %
MCH: 30.4 pg (ref 26.6–33.0)
MCHC: 33.3 g/dL (ref 31.5–35.7)
MCV: 92 fL (ref 79–97)
Monocytes Absolute: 0.3 10*3/uL (ref 0.1–0.9)
Monocytes: 5 %
Neutrophils Absolute: 4.3 10*3/uL (ref 1.4–7.0)
Neutrophils: 67 %
Platelets: 193 10*3/uL (ref 150–450)
RBC: 4.57 x10E6/uL (ref 3.77–5.28)
RDW: 13 % (ref 11.7–15.4)
WBC: 6.4 10*3/uL (ref 3.4–10.8)

## 2022-11-06 LAB — LIPID PANEL
Chol/HDL Ratio: 2.6 ratio (ref 0.0–4.4)
Cholesterol, Total: 121 mg/dL (ref 100–199)
HDL: 46 mg/dL (ref >39)
LDL Chol Calc (NIH): 57 mg/dL (ref 0–99)
Triglycerides: 97 mg/dL (ref 0–149)
VLDL Cholesterol Cal: 18 mg/dL (ref 5–40)

## 2022-11-06 LAB — HEMOGLOBIN A1C
Est. average glucose Bld gHb Est-mCnc: 169 mg/dL
Hgb A1c MFr Bld: 7.5 % — ABNORMAL HIGH (ref 4.8–5.6)

## 2022-11-06 LAB — CARDIOVASCULAR RISK ASSESSMENT

## 2022-11-12 ENCOUNTER — Encounter: Payer: Self-pay | Admitting: Family Medicine

## 2022-11-12 DIAGNOSIS — N1832 Chronic kidney disease, stage 3b: Secondary | ICD-10-CM | POA: Diagnosis not present

## 2022-11-12 DIAGNOSIS — R944 Abnormal results of kidney function studies: Secondary | ICD-10-CM | POA: Diagnosis not present

## 2022-11-17 DIAGNOSIS — N1832 Chronic kidney disease, stage 3b: Secondary | ICD-10-CM | POA: Diagnosis not present

## 2022-11-17 DIAGNOSIS — I129 Hypertensive chronic kidney disease with stage 1 through stage 4 chronic kidney disease, or unspecified chronic kidney disease: Secondary | ICD-10-CM | POA: Diagnosis not present

## 2022-11-17 DIAGNOSIS — E1122 Type 2 diabetes mellitus with diabetic chronic kidney disease: Secondary | ICD-10-CM | POA: Diagnosis not present

## 2022-11-18 LAB — LAB REPORT - SCANNED: Creatinine, POC: 50.3 mg/dL

## 2022-11-24 ENCOUNTER — Encounter: Payer: Self-pay | Admitting: Nephrology

## 2022-11-24 DIAGNOSIS — M5136 Other intervertebral disc degeneration, lumbar region: Secondary | ICD-10-CM | POA: Diagnosis not present

## 2022-11-24 DIAGNOSIS — M9903 Segmental and somatic dysfunction of lumbar region: Secondary | ICD-10-CM | POA: Diagnosis not present

## 2022-11-24 DIAGNOSIS — M5442 Lumbago with sciatica, left side: Secondary | ICD-10-CM | POA: Diagnosis not present

## 2022-11-24 DIAGNOSIS — M9904 Segmental and somatic dysfunction of sacral region: Secondary | ICD-10-CM | POA: Diagnosis not present

## 2022-11-25 ENCOUNTER — Other Ambulatory Visit: Payer: Self-pay

## 2022-11-25 MED ORDER — SIMVASTATIN 40 MG PO TABS: 40.0000 mg | ORAL_TABLET | Freq: Every day | ORAL | 0 refills | Status: DC

## 2022-12-03 ENCOUNTER — Other Ambulatory Visit: Payer: Self-pay | Admitting: Family Medicine

## 2022-12-05 ENCOUNTER — Telehealth: Payer: Self-pay | Admitting: Pharmacist

## 2022-12-05 NOTE — Progress Notes (Signed)
Contacted patient regarding upcoming appointment with [facility] pharmacist. Per clinical review, no pharmacist appointment needed at this time. Patient denies any medication needs at this time. Appointment canceled.   Catie T. Raeden Belzer, PharmD, BCACP, CPP Clinical Pharmacist [facility] [phone]  

## 2022-12-22 DIAGNOSIS — M9903 Segmental and somatic dysfunction of lumbar region: Secondary | ICD-10-CM | POA: Diagnosis not present

## 2022-12-22 DIAGNOSIS — M5442 Lumbago with sciatica, left side: Secondary | ICD-10-CM | POA: Diagnosis not present

## 2022-12-22 DIAGNOSIS — M5136 Other intervertebral disc degeneration, lumbar region: Secondary | ICD-10-CM | POA: Diagnosis not present

## 2022-12-22 DIAGNOSIS — M9904 Segmental and somatic dysfunction of sacral region: Secondary | ICD-10-CM | POA: Diagnosis not present

## 2022-12-28 ENCOUNTER — Other Ambulatory Visit: Payer: Self-pay | Admitting: Family Medicine

## 2023-01-07 ENCOUNTER — Other Ambulatory Visit: Payer: Self-pay | Admitting: Family Medicine

## 2023-01-07 DIAGNOSIS — F330 Major depressive disorder, recurrent, mild: Secondary | ICD-10-CM

## 2023-01-08 ENCOUNTER — Other Ambulatory Visit: Payer: Self-pay | Admitting: Family Medicine

## 2023-01-19 DIAGNOSIS — M9904 Segmental and somatic dysfunction of sacral region: Secondary | ICD-10-CM | POA: Diagnosis not present

## 2023-01-19 DIAGNOSIS — M5136 Other intervertebral disc degeneration, lumbar region: Secondary | ICD-10-CM | POA: Diagnosis not present

## 2023-01-19 DIAGNOSIS — M9903 Segmental and somatic dysfunction of lumbar region: Secondary | ICD-10-CM | POA: Diagnosis not present

## 2023-01-19 DIAGNOSIS — M5442 Lumbago with sciatica, left side: Secondary | ICD-10-CM | POA: Diagnosis not present

## 2023-01-24 ENCOUNTER — Other Ambulatory Visit (HOSPITAL_BASED_OUTPATIENT_CLINIC_OR_DEPARTMENT_OTHER): Payer: Self-pay

## 2023-01-26 ENCOUNTER — Other Ambulatory Visit (HOSPITAL_BASED_OUTPATIENT_CLINIC_OR_DEPARTMENT_OTHER): Payer: Self-pay

## 2023-01-26 ENCOUNTER — Other Ambulatory Visit (HOSPITAL_COMMUNITY): Payer: Self-pay

## 2023-01-26 ENCOUNTER — Other Ambulatory Visit: Payer: Self-pay | Admitting: Family Medicine

## 2023-01-26 ENCOUNTER — Other Ambulatory Visit: Payer: Self-pay

## 2023-01-26 DIAGNOSIS — F330 Major depressive disorder, recurrent, mild: Secondary | ICD-10-CM

## 2023-01-26 MED ORDER — SIMVASTATIN 40 MG PO TABS
40.0000 mg | ORAL_TABLET | Freq: Every day | ORAL | 0 refills | Status: DC
  Filled 2023-01-26: qty 90, 90d supply, fill #0

## 2023-01-26 MED ORDER — VITAMIN D3 25 MCG (1000 UNIT) PO TABS
1000.0000 [IU] | ORAL_TABLET | Freq: Every day | ORAL | 3 refills | Status: AC
  Filled 2023-01-26: qty 100, 100d supply, fill #0
  Filled 2023-05-20: qty 100, 100d supply, fill #1
  Filled 2023-09-24: qty 100, 100d supply, fill #2
  Filled 2023-12-19 – 2023-12-28 (×2): qty 100, 100d supply, fill #3

## 2023-01-26 MED ORDER — FLUOXETINE HCL 20 MG PO CAPS
40.0000 mg | ORAL_CAPSULE | Freq: Every day | ORAL | 1 refills | Status: DC
Start: 2023-01-26 — End: 2023-10-10
  Filled 2023-01-26 – 2023-04-13 (×2): qty 180, 90d supply, fill #0
  Filled 2023-07-15: qty 180, 90d supply, fill #1

## 2023-01-26 MED ORDER — MONTELUKAST SODIUM 10 MG PO TABS
10.0000 mg | ORAL_TABLET | Freq: Every day | ORAL | 3 refills | Status: AC
  Filled 2023-01-26: qty 90, 90d supply, fill #0
  Filled 2023-05-27: qty 90, 90d supply, fill #1
  Filled 2023-12-28 (×2): qty 90, 90d supply, fill #2

## 2023-01-26 MED ORDER — EZETIMIBE 10 MG PO TABS
10.0000 mg | ORAL_TABLET | Freq: Every day | ORAL | 1 refills | Status: DC
  Filled 2023-01-26: qty 90, 90d supply, fill #0
  Filled 2023-05-01: qty 90, 90d supply, fill #1

## 2023-01-26 MED ORDER — PANTOPRAZOLE SODIUM 40 MG PO TBEC
40.0000 mg | DELAYED_RELEASE_TABLET | Freq: Every day | ORAL | 3 refills | Status: DC
  Filled 2023-01-26: qty 90, 90d supply, fill #0

## 2023-01-26 MED ORDER — CETIRIZINE HCL 10 MG PO TABS
10.0000 mg | ORAL_TABLET | Freq: Every day | ORAL | 3 refills | Status: DC
  Filled 2023-01-26: qty 90, 90d supply, fill #0
  Filled 2023-09-02: qty 90, 90d supply, fill #1
  Filled 2023-12-19 – 2023-12-28 (×2): qty 90, 90d supply, fill #2

## 2023-01-26 MED ORDER — DAPAGLIFLOZIN PROPANEDIOL 10 MG PO TABS
10.0000 mg | ORAL_TABLET | Freq: Every day | ORAL | 3 refills | Status: DC
  Filled 2023-01-26: qty 30, 30d supply, fill #0

## 2023-01-26 MED ORDER — LOSARTAN POTASSIUM 100 MG PO TABS
100.0000 mg | ORAL_TABLET | Freq: Every evening | ORAL | 1 refills | Status: DC
  Filled 2023-01-26 – 2023-04-13 (×2): qty 90, 90d supply, fill #0
  Filled 2023-07-15: qty 90, 90d supply, fill #1

## 2023-01-26 MED ORDER — SITAGLIPTIN PHOSPHATE 50 MG PO TABS
50.0000 mg | ORAL_TABLET | Freq: Every day | ORAL | 0 refills | Status: DC
  Filled 2023-01-26: qty 30, 30d supply, fill #0
  Filled 2023-03-04: qty 30, 30d supply, fill #1
  Filled 2023-04-03: qty 30, 30d supply, fill #2

## 2023-01-26 MED ORDER — FAMOTIDINE 20 MG PO TABS
20.0000 mg | ORAL_TABLET | Freq: Every day | ORAL | 0 refills | Status: AC
  Filled 2023-01-26: qty 90, 90d supply, fill #0

## 2023-01-26 MED ORDER — VITAMIN D (ERGOCALCIFEROL) 1.25 MG (50000 UNIT) PO CAPS
ORAL_CAPSULE | ORAL | 1 refills | Status: DC
  Filled 2023-01-26: qty 5, 35d supply, fill #0
  Filled 2023-04-13: qty 5, 35d supply, fill #1

## 2023-01-27 ENCOUNTER — Other Ambulatory Visit (HOSPITAL_COMMUNITY): Payer: Self-pay

## 2023-01-27 ENCOUNTER — Other Ambulatory Visit: Payer: Self-pay

## 2023-01-27 ENCOUNTER — Telehealth: Payer: Self-pay

## 2023-02-18 DIAGNOSIS — M5136 Other intervertebral disc degeneration, lumbar region: Secondary | ICD-10-CM | POA: Diagnosis not present

## 2023-02-18 DIAGNOSIS — M5442 Lumbago with sciatica, left side: Secondary | ICD-10-CM | POA: Diagnosis not present

## 2023-02-18 DIAGNOSIS — M9904 Segmental and somatic dysfunction of sacral region: Secondary | ICD-10-CM | POA: Diagnosis not present

## 2023-02-18 DIAGNOSIS — M9903 Segmental and somatic dysfunction of lumbar region: Secondary | ICD-10-CM | POA: Diagnosis not present

## 2023-02-18 NOTE — Assessment & Plan Note (Addendum)
Well controlled.  No changes to medicines. Continue Simvastatin 40 mg daily, Zetia 10 mg daily.  Continue to work on eating a healthy diet and exercise.  Labs drawn today.

## 2023-02-18 NOTE — Assessment & Plan Note (Addendum)
Control: fair Medicines: Continue Farxiga 10 mg daily. Continue to work on eating a healthy diet and exercise.  Labs drawn today.   Continue to check feet daily.  Check sugars daily.  Annual eye exam recommended.

## 2023-02-18 NOTE — Progress Notes (Unsigned)
Subjective:  Patient ID: Alicia Jordan, female    DOB: 1945/08/21  Age: 77 y.o. MRN: [id number]  Chief Complaint  Patient presents with   Medical Management of Chronic Issues    HPI Diabetes:   Complications: nephropathy. CKD 3b. Checks sugars 2-3 times per week Log: 110-140. Checking feet daily.  Last HbA1c: 7.5 Eye exam: September 2023. Has cataracts. No polydipsia, no polyphagia, no polyuria.  Medications: Farxiga 10 mg daily.   Hyperlipidemia:  Taking Simvastatin 40 mg daily, Zetia 10 mg daily, and not taking Vascepa because insurance would not pay for this.  Hypertension associated with CKD:  Losartan 100 mg daily.    Depression/Anxiety: on prozac 20 mg 2 daily. Well controlled.   GERD: Pantoprazole 40 mg daily currently uncontrolled.  She has been taking Pepcid daily and mylanta as needed.    Vitamin D deficiency: Vitamin D 50,000 units 1 capsule once weekly.        02/19/2023    9:34 AM 11/05/2022    8:54 AM 07/30/2022    8:42 AM 05/14/2022    9:31 AM 02/20/2022    9:04 AM  Depression screen PHQ 2/9  Decreased Interest 0 0 0 0 0  Down, Depressed, Hopeless 0 0 0 0 0  PHQ - 2 Score 0 0 0 0 0  Altered sleeping 1  0 0 0  Tired, decreased energy 1  1 2 1   Change in appetite 0  0 1 0  Feeling bad or failure about yourself  0  0 0 0  Trouble concentrating 0  0 0 0  Moving slowly or fidgety/restless 0  0 0 0  Suicidal thoughts 0  0 0 0  PHQ-9 Score 2  1 3 1   Difficult doing work/chores Not difficult at all  Not difficult at all Not difficult at all Not difficult at all        02/19/2023    9:33 AM  Fall Risk   Falls in the past year? 0  Number falls in past yr: 0  Injury with Fall? 0  Risk for fall due to : No Fall Risks  Follow up Falls evaluation completed;Falls prevention discussed    Patient Care Team: Blane Ohara, MD as PCP - General (Family Medicine) Lynann Bologna, MD as Consulting Physician (Gastroenterology) Linda Hedges, MD as Referring  Physician (Orthopedic Surgery) Dannielle Huh, MD as Consulting Physician (Orthopedic Surgery)   Review of Systems  Constitutional:  Positive for fatigue. Negative for chills and fever.  HENT:  Positive for congestion and ear pain (left ear pain). Negative for rhinorrhea and sore throat.   Respiratory:  Positive for cough (chronic tickle in her throat). Negative for shortness of breath.   Cardiovascular:  Negative for chest pain.  Gastrointestinal:  Positive for abdominal pain (epigastric and left sided pain) and constipation. Negative for diarrhea, nausea and vomiting.  Genitourinary:  Negative for dysuria and urgency.  Musculoskeletal:  Positive for back pain. Negative for myalgias.  Neurological:  Negative for dizziness, weakness, light-headedness and headaches.  Psychiatric/Behavioral:  Negative for dysphoric mood. The patient is not nervous/anxious.     Current Outpatient Medications on File Prior to Visit  Medication Sig Dispense Refill   Acetaminophen (TYLENOL PO) Take by mouth as needed.     cetirizine (ZYRTEC) 10 MG tablet Take 1 tablet (10 mg total) by mouth daily. 90 tablet 3   cholecalciferol (VITAMIN D3) 25 MCG (1000 UNIT) tablet Take 1 tablet (1,000 Units total) by mouth daily.  180 tablet 3   dapagliflozin propanediol (FARXIGA) 10 MG TABS tablet Take 1 tablet (10 mg) by mouth daily before breakfast. 90 tablet 3   ezetimibe (ZETIA) 10 MG tablet Take 1 tablet (10 mg) by mouth daily. 90 tablet 1   famotidine (PEPCID) 20 MG tablet Take 1 tablet (20 mg) by mouth at bedtime. 90 tablet 0   FLUoxetine (PROZAC) 20 MG capsule Take 2 capsules (40 mg) by mouth daily. 180 capsule 1   glucose blood test strip 1 each by Other route 2 (two) times daily. Use as instructed 100 each 2   losartan (COZAAR) 100 MG tablet Take 1 tablet (100 mg total) by mouth every evening. 90 tablet 1   montelukast (SINGULAIR) 10 MG tablet Take 1 tablet (10 mg total) by mouth at bedtime. 90 tablet 3   ondansetron  (ZOFRAN) 4 MG tablet Take 1 tablet (4 mg total) by mouth every 8 (eight) hours as needed for nausea or vomiting. 20 tablet 0   simvastatin (ZOCOR) 40 MG tablet Take 1 tablet (40 mg) by mouth at bedtime. 90 tablet 0   sitaGLIPtin (JANUVIA) 50 MG tablet Take 1 tablet by mouth once daily 90 tablet 0   vitamin B-12 (CYANOCOBALAMIN) 100 MCG tablet Take 100 mcg by mouth daily.     Vitamin D, Ergocalciferol, (DRISDOL) 1.25 MG (50000 UNIT) CAPS capsule TAKE 1 CAPSULE BY MOUTH ONCE WEEKLY ON SATURDAY 5 capsule 1   No current facility-administered medications on file prior to visit.   Past Medical History:  Diagnosis Date   Allergy    Anemia    Anxiety    Arthralgia of left temporomandibular joint    Arthritis    Cervicalgia    Chronic kidney disease, stage 3a (HCC)    Depression    Diabetes mellitus without complication (HCC)    Hesitancy of micturition    Hyperlipidemia    Hypertension    Osteoporosis    Other psoriasis    Other specified menopausal and perimenopausal disorders    Overweight    Repeated falls    Residual hemorrhoidal skin tags    Past Surgical History:  Procedure Laterality Date   ABDOMINAL HYSTERECTOMY     APPENDECTOMY     CARPAL TUNNEL RELEASE Right    CHOLECYSTECTOMY     COLONOSCOPY  04/09/2016   Mild sigmoid diverticulosis. Otherwise normal colonoscopy   ESOPHAGOGASTRODUODENOSCOPY  09/10/2012   No obvious esophageal stricture, status post empiric esophageal dilation. Irregular GE junction, questionable significance. Mild gastritis.   eye lid surgery     KNEE SURGERY Left    REPLACEMENT TOTAL KNEE Right 06/03/2021    Family History  Problem Relation Age of Onset   Diabetes type II Mother    Hyperlipidemia Mother    Hypertension Mother    Dementia Mother    Heart attack Father    Hyperlipidemia Father    Hypertension Father    Stroke Father    Diabetes type II Father    Colon cancer Daughter 13   Cancer Daughter    Esophageal cancer Neg Hx    Rectal  cancer Neg Hx    Stomach cancer Neg Hx    Social History   Socioeconomic History   Marital status: Married    Spouse name: Jake Shark   Number of children: 4   Years of education: Not on file   Highest education level: Not on file  Occupational History   Occupation: Homemaker  Tobacco Use   Smoking status: Never  Smokeless tobacco: Never  Vaping Use   Vaping status: Never Used  Substance and Sexual Activity   Alcohol use: Never   Drug use: Never   Sexual activity: Not on file  Other Topics Concern   Not on file  Social History Narrative   Reshunda spends a lot if time helping her daughter get to and from oncology appointments   Her husband has started drinking alcohol again   Social Determinants of Health   Financial Resource Strain: Medium Risk (02/19/2023)   Overall Financial Resource Strain (CARDIA)    Difficulty of Paying Living Expenses: Somewhat hard  Food Insecurity: No Food Insecurity (02/19/2023)   Hunger Vital Sign    Worried About Running Out of Food in the Last Year: Never true    Ran Out of Food in the Last Year: Never true  Transportation Needs: No Transportation Needs (02/19/2023)   PRAPARE - Administrator, Civil Service (Medical): No    Lack of Transportation (Non-Medical): No  Physical Activity: Sufficiently Active (02/19/2023)   Exercise Vital Sign    Days of Exercise per Week: 5 days    Minutes of Exercise per Session: 30 min  Stress: No Stress Concern Present (02/19/2023)   [employer] of Occupational Health - Occupational Stress Questionnaire    Feeling of Stress : Only a little  Social Connections: Socially Integrated (02/19/2023)   Social Connection and Isolation Panel [NHANES]    Frequency of Communication with Friends and Family: More than three times a week    Frequency of Social Gatherings with Friends and Family: More than three times a week    Attends Religious Services: More than 4 times per year    Active Member of [employer] or  Organizations: Yes    Attends Engineer, structural: More than 4 times per year    Marital Status: Married    Objective:  BP 120/68   Pulse 80   Temp (!) 97.2 F (36.2 C)   Resp 14   Ht 5\' 1"  (1.549 m)   Wt 140 lb 3.2 oz (63.6 kg)   BMI 26.49 kg/m      02/19/2023    9:59 AM 02/19/2023    9:32 AM 11/05/2022    8:46 AM  BP/Weight  Systolic BP 120 120 110  Diastolic BP 68 68 60  Wt. (Lbs) 140.3 140.2 139.8  BMI 26.51 kg/m2 26.49 kg/m2 26.41 kg/m2    Physical Exam Vitals reviewed.  Constitutional:      Appearance: Normal appearance. She is normal weight.  HENT:     Right Ear: Tympanic membrane normal.     Left Ear: Tympanic membrane normal.  Neck:     Vascular: No carotid bruit.  Cardiovascular:     Rate and Rhythm: Normal rate and regular rhythm.     Heart sounds: Normal heart sounds.  Pulmonary:     Effort: Pulmonary effort is normal. No respiratory distress.     Breath sounds: Normal breath sounds.  Abdominal:     General: Abdomen is flat. Bowel sounds are normal.     Palpations: Abdomen is soft.     Tenderness: There is no abdominal tenderness.  Neurological:     Mental Status: She is alert and oriented to person, place, and time.  Psychiatric:        Mood and Affect: Mood normal.        Behavior: Behavior normal.     Diabetic Foot Exam - Simple   Simple Foot  Form  02/18/2023  3:55 PM  Visual Inspection No deformities, no ulcerations, no other skin breakdown bilaterally: Yes Sensation Testing Intact to touch and monofilament testing bilaterally: Yes Pulse Check Posterior Tibialis and Dorsalis pulse intact bilaterally: Yes Comments      Lab Results  Component Value Date   WBC 6.4 11/05/2022   HGB 13.9 11/05/2022   HCT 41.8 11/05/2022   PLT 193 11/05/2022   GLUCOSE 169 (H) 11/05/2022   CHOL 121 11/05/2022   TRIG 97 11/05/2022   HDL 46 11/05/2022   LDLCALC 57 11/05/2022   ALT 19 11/05/2022   AST 21 11/05/2022   NA 142 11/05/2022   K 4.6  11/05/2022   CL 103 11/05/2022   CREATININE 1.62 (H) 11/05/2022   BUN 36 (H) 11/05/2022   CO2 20 11/05/2022   TSH 2.830 04/22/2022   INR 1.0 12/14/2020   HGBA1C 7.5 (H) 11/05/2022   MICROALBUR 30 03/29/2021      Assessment & Plan:    GERD without esophagitis Assessment & Plan: Increase pantoprazole to 40 mg twice daily for increased heart burn.   Orders: -     Pantoprazole Sodium; Take 1 tablet (40 mg total) by mouth 2 (two) times daily.  Dispense: 180 tablet; Refill: 0  Diabetic glomerulopathy [facility]) Assessment & Plan: Control: fair Medicines: Continue Farxiga 10 mg daily. Continue to work on eating a healthy diet and exercise.  Labs drawn today.   Continue to check feet daily.  Check sugars daily.  Annual eye exam recommended.   Orders: -     Hemoglobin A1c  Hypertensive kidney disease with stage 3b chronic kidney disease (HCC) Assessment & Plan: Well controlled.  No changes to medicines.  Continue Losartan 100 mg daily.   Continue to work on eating a healthy diet and exercise.  Labs drawn today.    Orders: -     CBC with Differential/Platelet -     Comprehensive metabolic panel  Mixed hyperlipidemia Assessment & Plan: Well controlled.  No changes to medicines. Continue Simvastatin 40 mg daily, Zetia 10 mg daily.  Continue to work on eating a healthy diet and exercise.  Labs drawn today.    Orders: -     Lipid panel  Need for influenza vaccination -     Flu Vaccine Trivalent High Dose (Fluad)  Paresthesia Assessment & Plan: Check labs  Orders: -     B12 and Folate Panel -     Methylmalonic acid, serum     Meds ordered this encounter  Medications   pantoprazole (PROTONIX) 40 MG tablet    Sig: Take 1 tablet (40 mg total) by mouth 2 (two) times daily.    Dispense:  180 tablet    Refill:  0    Orders Placed This Encounter  Procedures   Flu Vaccine Trivalent High Dose (Fluad)   CBC with Differential/Platelet   Comprehensive metabolic panel    Hemoglobin A1c   Lipid panel   B12 and Folate Panel   Methylmalonic acid, serum     Follow-up: No follow-ups on file.   I,Marla I Leal-Borjas,acting as a scribe for Blane Ohara, MD.,have documented all relevant documentation on the behalf of Blane Ohara, MD,as directed by  Blane Ohara, MD while in the presence of Blane Ohara, MD.   An After Visit Summary was printed and given to the patient.  Blane Ohara, MD [facility] [phone]

## 2023-02-18 NOTE — Assessment & Plan Note (Signed)
Well controlled.  No changes to medicines.  Continue Losartan 100 mg daily.   Continue to work on eating a healthy diet and exercise.  Labs drawn today.

## 2023-02-18 NOTE — Assessment & Plan Note (Deleted)
The current medical regimen is effective;  continue present plan and medications.  

## 2023-02-19 ENCOUNTER — Ambulatory Visit (INDEPENDENT_AMBULATORY_CARE_PROVIDER_SITE_OTHER): Payer: PPO

## 2023-02-19 ENCOUNTER — Ambulatory Visit (INDEPENDENT_AMBULATORY_CARE_PROVIDER_SITE_OTHER): Payer: PPO | Admitting: Family Medicine

## 2023-02-19 ENCOUNTER — Encounter: Payer: Self-pay | Admitting: Family Medicine

## 2023-02-19 VITALS — BP 120/68 | HR 80 | Resp 16 | Ht 61.0 in | Wt 140.3 lb

## 2023-02-19 VITALS — BP 120/68 | HR 80 | Temp 97.2°F | Resp 14 | Ht 61.0 in | Wt 140.2 lb

## 2023-02-19 DIAGNOSIS — N959 Unspecified menopausal and perimenopausal disorder: Secondary | ICD-10-CM | POA: Diagnosis not present

## 2023-02-19 DIAGNOSIS — E1121 Type 2 diabetes mellitus with diabetic nephropathy: Secondary | ICD-10-CM | POA: Diagnosis not present

## 2023-02-19 DIAGNOSIS — N1832 Chronic kidney disease, stage 3b: Secondary | ICD-10-CM

## 2023-02-19 DIAGNOSIS — Z0000 Encounter for general adult medical examination without abnormal findings: Secondary | ICD-10-CM

## 2023-02-19 DIAGNOSIS — Z9189 Other specified personal risk factors, not elsewhere classified: Secondary | ICD-10-CM

## 2023-02-19 DIAGNOSIS — K219 Gastro-esophageal reflux disease without esophagitis: Secondary | ICD-10-CM

## 2023-02-19 DIAGNOSIS — E782 Mixed hyperlipidemia: Secondary | ICD-10-CM

## 2023-02-19 DIAGNOSIS — R202 Paresthesia of skin: Secondary | ICD-10-CM | POA: Diagnosis not present

## 2023-02-19 DIAGNOSIS — Z23 Encounter for immunization: Secondary | ICD-10-CM | POA: Diagnosis not present

## 2023-02-19 DIAGNOSIS — Z1231 Encounter for screening mammogram for malignant neoplasm of breast: Secondary | ICD-10-CM | POA: Diagnosis not present

## 2023-02-19 DIAGNOSIS — I129 Hypertensive chronic kidney disease with stage 1 through stage 4 chronic kidney disease, or unspecified chronic kidney disease: Secondary | ICD-10-CM

## 2023-02-19 MED ORDER — PANTOPRAZOLE SODIUM 40 MG PO TBEC
40.0000 mg | DELAYED_RELEASE_TABLET | Freq: Two times a day (BID) | ORAL | 0 refills | Status: DC
Start: 2023-02-19 — End: 2023-04-14

## 2023-02-19 NOTE — Progress Notes (Signed)
Subjective:   Alicia Jordan is a 77 y.o. female who presents for Medicare Annual (Subsequent) preventive examination.  This wellness visit is conducted by a nurse.  The patient's medications were reviewed and reconciled since the patient's last visit.  History details were provided by the patient.  The history appears to be reliable.    Medical History: Patient history and Family history was reviewed  Medications, Allergies, and preventative health maintenance was reviewed and updated.   Visit Complete: In person  Cardiac Risk Factors include: diabetes mellitus;advanced age (>60men, >73 women);dyslipidemia     Objective:    Today's Vitals   02/19/23 0959  BP: 120/68  Pulse: 80  Resp: 16  SpO2: 97%  Weight: 140 lb 4.8 oz (63.6 kg)  Height: 5\' 1"  (1.549 m)  PainSc: 0-No pain   Body mass index is 26.51 kg/m.     02/19/2023   10:20 AM 02/20/2022    9:04 AM 02/01/2021   11:49 AM 09/06/2020    9:44 AM 08/16/2019   10:27 AM  Advanced Directives  Does Patient Have a Medical Advance Directive? Yes Yes Yes Yes Yes  Type of Estate agent of [place];Living will Healthcare Power of [place];Living will  Healthcare Power of [employer] of [place];Living will  Does patient want to make changes to medical advance directive? No - Patient declined No - Patient declined  No - Patient declined No - Patient declined  Copy of Healthcare Power of Attorney in Chart? Yes - validated most recent copy scanned in chart (See row information) Yes - validated most recent copy scanned in chart (See row information)  No - copy requested No - copy requested    Current Medications (verified) Outpatient Encounter Medications as of 02/19/2023  Medication Sig   Acetaminophen (TYLENOL PO) Take by mouth as needed.   cetirizine (ZYRTEC) 10 MG tablet Take 1 tablet (10 mg total) by mouth daily.   cholecalciferol (VITAMIN D3) 25 MCG (1000 UNIT) tablet Take 1 tablet (1,000 Units  total) by mouth daily.   dapagliflozin propanediol (FARXIGA) 10 MG TABS tablet Take 1 tablet (10 mg) by mouth daily before breakfast.   ezetimibe (ZETIA) 10 MG tablet Take 1 tablet (10 mg) by mouth daily.   famotidine (PEPCID) 20 MG tablet Take 1 tablet (20 mg) by mouth at bedtime.   FLUoxetine (PROZAC) 20 MG capsule Take 2 capsules (40 mg) by mouth daily.   glucose blood test strip 1 each by Other route 2 (two) times daily. Use as instructed   losartan (COZAAR) 100 MG tablet Take 1 tablet (100 mg total) by mouth every evening.   montelukast (SINGULAIR) 10 MG tablet Take 1 tablet (10 mg total) by mouth at bedtime.   ondansetron (ZOFRAN) 4 MG tablet Take 1 tablet (4 mg total) by mouth every 8 (eight) hours as needed for nausea or vomiting.   simvastatin (ZOCOR) 40 MG tablet Take 1 tablet (40 mg) by mouth at bedtime.   sitaGLIPtin (JANUVIA) 50 MG tablet Take 1 tablet by mouth once daily   vitamin B-12 (CYANOCOBALAMIN) 100 MCG tablet Take 100 mcg by mouth daily.   Vitamin D, Ergocalciferol, (DRISDOL) 1.25 MG (50000 UNIT) CAPS capsule TAKE 1 CAPSULE BY MOUTH ONCE WEEKLY ON SATURDAY   [DISCONTINUED] pantoprazole (PROTONIX) 40 MG tablet Take 1 tablet (40 mg) by mouth daily.   No facility-administered encounter medications on file as of 02/19/2023.    Allergies (verified) Meloxicam, Rosuvastatin, and Semaglutide   History: Past Medical  History:  Diagnosis Date   Allergy    Anemia    Anxiety    Arthralgia of left temporomandibular joint    Arthritis    Cervicalgia    Chronic kidney disease, stage 3a (HCC)    Depression    Diabetes mellitus without complication (HCC)    Hesitancy of micturition    Hyperlipidemia    Hypertension    Osteoporosis    Other psoriasis    Other specified menopausal and perimenopausal disorders    Overweight    Repeated falls    Residual hemorrhoidal skin tags    Past Surgical History:  Procedure Laterality Date   ABDOMINAL HYSTERECTOMY     APPENDECTOMY      CARPAL TUNNEL RELEASE Right    CHOLECYSTECTOMY     COLONOSCOPY  04/09/2016   Mild sigmoid diverticulosis. Otherwise normal colonoscopy   ESOPHAGOGASTRODUODENOSCOPY  09/10/2012   No obvious esophageal stricture, status post empiric esophageal dilation. Irregular GE junction, questionable significance. Mild gastritis.   eye lid surgery     KNEE SURGERY Left    REPLACEMENT TOTAL KNEE Right 06/03/2021   Family History  Problem Relation Age of Onset   Diabetes type II Mother    Hyperlipidemia Mother    Hypertension Mother    Dementia Mother    Heart attack Father    Hyperlipidemia Father    Hypertension Father    Stroke Father    Diabetes type II Father    Colon cancer Daughter 49   Cancer Daughter    Esophageal cancer Neg Hx    Rectal cancer Neg Hx    Stomach cancer Neg Hx    Social History   Socioeconomic History   Marital status: Married    Spouse name: Jake Shark   Number of children: 4   Years of education: Not on file   Highest education level: Not on file  Occupational History   Occupation: Homemaker  Tobacco Use   Smoking status: Never   Smokeless tobacco: Never  Vaping Use   Vaping status: Never Used  Substance and Sexual Activity   Alcohol use: Never   Drug use: Never   Sexual activity: Not on file  Other Topics Concern   Not on file  Social History Narrative   Lavone spends a lot if time helping her daughter get to and from oncology appointments   Her husband has started drinking alcohol again   Social Determinants of Health   Financial Resource Strain: Medium Risk (02/19/2023)   Overall Financial Resource Strain (CARDIA)    Difficulty of Paying Living Expenses: Somewhat hard  Food Insecurity: No Food Insecurity (02/19/2023)   Hunger Vital Sign    Worried About Running Out of Food in the Last Year: Never true    Ran Out of Food in the Last Year: Never true  Transportation Needs: No Transportation Needs (02/19/2023)   PRAPARE - Scientist, research (physical sciences) (Medical): No    Lack of Transportation (Non-Medical): No  Physical Activity: Sufficiently Active (02/19/2023)   Exercise Vital Sign    Days of Exercise per Week: 5 days    Minutes of Exercise per Session: 30 min  Stress: No Stress Concern Present (02/19/2023)   [employer] of Occupational Health - Occupational Stress Questionnaire    Feeling of Stress : Only a little  Social Connections: Socially Integrated (02/19/2023)   Social Connection and Isolation Panel [NHANES]    Frequency of Communication with Friends and Family: More than three times  a week    Frequency of Social Gatherings with Friends and Family: More than three times a week    Attends Religious Services: More than 4 times per year    Active Member of [employer] or Organizations: Yes    Attends Engineer, structural: More than 4 times per year    Marital Status: Married    Tobacco Counseling Counseling given: Not Answered   Clinical Intake:     Pain : No/denies pain Pain Score: 0-No pain     BMI - recorded: 26.51 Nutritional Status: BMI 25 -29 Overweight Nutritional Risks: None Diabetes: Yes (Most recent A1C 7.5) CBG done?: No  How often do you need to have someone help you when you read instructions, pamphlets, or other written materials from your doctor or pharmacy?: 1 - Never  Interpreter Needed?: No      Activities of Daily Living    02/19/2023   10:20 AM 02/20/2022    9:04 AM  In your present state of health, do you have any difficulty performing the following activities:  Hearing? 0 0  Vision? 0 0  Difficulty concentrating or making decisions? 0 0  Walking or climbing stairs? 0 0  Dressing or bathing? 0 0  Doing errands, shopping? 0 0  Preparing Food and eating ? N N  Using the Toilet? N N  In the past six months, have you accidently leaked urine? N N  Do you have problems with loss of bowel control? N N  Managing your Medications? N N  Managing your Finances? N N   Housekeeping or managing your Housekeeping? N N    Patient Care Team: Blane Ohara, MD as PCP - General (Family Medicine) Lynann Bologna, MD as Consulting Physician (Gastroenterology) Linda Hedges, MD as Referring Physician (Orthopedic Surgery) Dannielle Huh, MD as Consulting Physician (Orthopedic Surgery)  Indicate any recent Medical Services you may have received from other than [facility] providers in the past year (date may be approximate).     Assessment:   This is a routine wellness examination for [place].  Hearing/Vision screen No results found.   Goals Addressed   None    Depression Screen    02/19/2023    9:34 AM 11/05/2022    8:54 AM 07/30/2022    8:42 AM 05/14/2022    9:31 AM 02/20/2022    9:04 AM 01/14/2022    7:48 AM 07/06/2021   12:41 PM  PHQ 2/9 Scores  PHQ - 2 Score 0 0 0 0 0 0 0  PHQ- 9 Score 2  1 3 1 1 3     Fall Risk    02/19/2023    9:33 AM 11/05/2022    8:53 AM 07/30/2022    8:42 AM 05/14/2022    9:31 AM 02/20/2022    9:04 AM  Fall Risk   Falls in the past year? 0 0 0 0 0  Number falls in past yr: 0 0 0 0 0  Injury with Fall? 0 0 0 0 0  Risk for fall due to : No Fall Risks No Fall Risks No Fall Risks History of fall(s) No Fall Risks  Follow up Falls evaluation completed;Falls prevention discussed Falls evaluation completed;Falls prevention discussed Falls evaluation completed Falls evaluation completed Falls evaluation completed;Education provided;Falls prevention discussed    MEDICARE RISK AT HOME: Medicare Risk at Home Any stairs in or around the home?: No If so, are there any without handrails?: No Home free of loose throw rugs in walkways, pet beds,  electrical cords, etc?: Yes Adequate lighting in your home to reduce risk of falls?: Yes Life alert?: No Use of a cane, walker or w/c?: No Grab bars in the bathroom?: Yes Shower chair or bench in shower?: Yes Elevated toilet seat or a handicapped toilet?: Yes  TIMED UP AND GO:  Was the test performed?   Yes  Length of time to ambulate 10 feet: 6 sec Gait steady and fast without use of assistive device    Cognitive Function:        02/19/2023   10:21 AM 02/20/2022    9:05 AM 09/06/2020   10:32 AM 08/16/2019   10:31 AM  6CIT Screen  What Year? 0 points 0 points 0 points 0 points  What month? 0 points 0 points 0 points 0 points  What time? 0 points 0 points 0 points 0 points  Count back from 20 0 points 0 points 0 points 0 points  Months in reverse 2 points 2 points 0 points 0 points  Repeat phrase 0 points 2 points 0 points 0 points  Total Score 2 points 4 points 0 points 0 points    Immunizations Immunization History  Administered Date(s) Administered   Fluad Quad(high Dose 65+) 02/08/2019, 03/29/2021, 04/22/2022   Fluad Trivalent(High Dose 65+) 02/19/2023   Influenza-Unspecified 02/27/2020   Moderna Sars-Covid-2 Vaccination 10/31/2019, 11/28/2019, 05/31/2020, 10/12/2020   PNEUMOCOCCAL CONJUGATE-20 11/22/2021   Pfizer Covid-19 Vaccine Bivalent Booster 88yrs & up 03/29/2021   Pneumococcal Conjugate-13 05/13/2014   Pneumococcal Polysaccharide-23 01/29/2017   Tdap 05/03/2012, 01/31/2022   Zoster Recombinant(Shingrix) 06/11/2017   Zoster, Live 05/16/2010    TDAP status: Up to date  Flu Vaccine status: Up to date  Pneumococcal vaccine status: Up to date  Covid-19 vaccine status: Information provided on how to obtain vaccines.   Qualifies for Shingles Vaccine? Yes   Zostavax completed No   Shingrix Completed?: No.    Education has been provided regarding the importance of this vaccine. Patient has been advised to call insurance company to determine out of pocket expense if they have not yet received this vaccine. Advised may also receive vaccine at local pharmacy or Health Dept. Verbalized acceptance and understanding.  Screening Tests Health Maintenance  Topic Date Due   DEXA SCAN  07/21/2019   MAMMOGRAM  07/22/2019   COVID-19 Vaccine (6 - 2023-24 season) 02/15/2023    OPHTHALMOLOGY EXAM  02/20/2023   HEMOGLOBIN A1C  05/08/2023   Diabetic kidney evaluation - Urine ACR  07/31/2023   Diabetic kidney evaluation - eGFR measurement  11/05/2023   FOOT EXAM  11/05/2023   Medicare Annual Wellness (AWV)  02/19/2024   Colonoscopy  02/07/2031   DTaP/Tdap/Td (3 - Td or Tdap) 02/01/2032   Pneumonia Vaccine 41+ Years old  Completed   INFLUENZA VACCINE  Completed   Hepatitis C Screening  Completed   HPV VACCINES  Aged Out   Zoster Vaccines- Shingrix  Discontinued    Health Maintenance  Health Maintenance Due  Topic Date Due   DEXA SCAN  07/21/2019   MAMMOGRAM  07/22/2019   COVID-19 Vaccine (6 - 2023-24 season) 02/15/2023    Colorectal cancer screening: No longer required.   Mammogram status: Ordered  Bone Density status: Ordered  Lung Cancer Screening: (Low Dose CT Chest recommended if Age 56-80 years, 20 pack-year currently smoking OR have quit w/in 15years.) does not qualify.   Additional Screening:  Vision Screening: Recommended annual ophthalmology exams for early detection of glaucoma and other disorders of the eye.  Is the patient up to date with their annual eye exam?  Yes  Who is the provider or what is the name of the office in which the patient attends annual eye exams? [facility]  Dental Screening: Recommended annual dental exams for proper oral hygiene   Community Resource Referral / Chronic Care Management: CRR required this visit?  No   CCM required this visit?  No     Plan:    1- Discussed patient assistance for Januvia, patient will check into household income and bring back completed application 2- Mammo and DEXA ordered 3- Patient has eye exam scheduled at [facility] next week  I have personally reviewed and noted the following in the patient's chart:   Medical and social history Use of alcohol, tobacco or illicit drugs  Current medications and supplements including opioid prescriptions. Patient is not currently  taking opioid prescriptions. Functional ability and status Nutritional status Physical activity Advanced directives List of other physicians Hospitalizations, surgeries, and ER visits in previous 12 months Vitals Screenings to include cognitive, depression, and falls Referrals and appointments  In addition, I have reviewed and discussed with patient certain preventive protocols, quality metrics, and best practice recommendations. A written personalized care plan for preventive services as well as general preventive health recommendations were provided to patient.     Jacklynn Bue, LPN   4/0/9811

## 2023-02-19 NOTE — Patient Instructions (Signed)
Alicia Jordan , Thank you for taking time to come for your Medicare Wellness Visit. I appreciate your ongoing commitment to your health goals. Please review the following plan we discussed and let me know if I can assist you in the future.    This is a list of the screening recommended for you and due dates:  Health Maintenance  Topic Date Due   DEXA scan (bone density measurement)  07/21/2019   Mammogram  07/22/2019   COVID-19 Vaccine (6 - 2023-24 season) 02/15/2023   Eye exam for diabetics  02/20/2023   Hemoglobin A1C  05/08/2023   Yearly kidney health urinalysis for diabetes  07/31/2023   Yearly kidney function blood test for diabetes  11/05/2023   Complete foot exam   11/05/2023   Medicare Annual Wellness Visit  02/19/2024   Colon Cancer Screening  02/07/2031   DTaP/Tdap/Td vaccine (3 - Td or Tdap) 02/01/2032   Pneumonia Vaccine  Completed   Flu Shot  Completed   Hepatitis C Screening  Completed   HPV Vaccine  Aged Out   Zoster (Shingles) Vaccine  Discontinued    Preventive Care 65 Years and Older, Female Preventive care refers to lifestyle choices and visits with your health care provider that can promote health and wellness. What does preventive care include? A yearly physical exam. This is also called an annual well check. Dental exams once or twice a year. Routine eye exams. Ask your health care provider how often you should have your eyes checked. Personal lifestyle choices, including: Daily care of your teeth and gums. Regular physical activity. Eating a healthy diet. Avoiding tobacco and drug use. Limiting alcohol use. Practicing safe sex. Taking low-dose aspirin every day. Taking vitamin and mineral supplements as recommended by your health care provider. What happens during an annual well check? The services and screenings done by your health care provider during your annual well check will depend on your age, overall health, lifestyle risk factors, and family  history of disease. Counseling  Your health care provider may ask you questions about your: Alcohol use. Tobacco use. Drug use. Emotional well-being. Home and relationship well-being. Sexual activity. Eating habits. History of falls. Memory and ability to understand (cognition). Work and work Astronomer. Reproductive health. Screening  You may have the following tests or measurements: Height, weight, and BMI. Blood pressure. Lipid and cholesterol levels. These may be checked every 5 years, or more frequently if you are over 50 years old. Skin check. Lung cancer screening. You may have this screening every year starting at age 60 if you have a 30-pack-year history of smoking and currently smoke or have quit within the past 15 years. Fecal occult blood test (FOBT) of the stool. You may have this test every year starting at age 30. Flexible sigmoidoscopy or colonoscopy. You may have a sigmoidoscopy every 5 years or a colonoscopy every 10 years starting at age 64. Hepatitis C blood test. Hepatitis B blood test. Sexually transmitted disease (STD) testing. Diabetes screening. This is done by checking your blood sugar (glucose) after you have not eaten for a while (fasting). You may have this done every 1-3 years. Bone density scan. This is done to screen for osteoporosis. You may have this done starting at age 42. Mammogram. This may be done every 1-2 years. Talk to your health care provider about how often you should have regular mammograms. Talk with your health care provider about your test results, treatment options, and if necessary, the need for more  tests. Vaccines  Your health care provider may recommend certain vaccines, such as: Influenza vaccine. This is recommended every year. Tetanus, diphtheria, and acellular pertussis (Tdap, Td) vaccine. You may need a Td booster every 10 years. Zoster vaccine. You may need this after age 37. Pneumococcal 13-valent conjugate (PCV13)  vaccine. One dose is recommended after age 48. Pneumococcal polysaccharide (PPSV23) vaccine. One dose is recommended after age 84. Talk to your health care provider about which screenings and vaccines you need and how often you need them. This information is not intended to replace advice given to you by your health care provider. Make sure you discuss any questions you have with your health care provider. Document Released: 06/29/2015 Document Revised: 02/20/2016 Document Reviewed: 04/03/2015 Elsevier Interactive Patient Education  2017 [employer].  Fall Prevention in the Home Falls can cause injuries. They can happen to people of all ages. There are many things you can do to make your home safe and to help prevent falls. What can I do on the outside of my home? Regularly fix the edges of walkways and driveways and fix any cracks. Remove anything that might make you trip as you walk through a door, such as a raised step or threshold. Trim any bushes or trees on the path to your home. Use bright outdoor lighting. Clear any walking paths of anything that might make someone trip, such as rocks or tools. Regularly check to see if handrails are loose or broken. Make sure that both sides of any steps have handrails. Any raised decks and porches should have guardrails on the edges. Have any leaves, snow, or ice cleared regularly. Use sand or salt on walking paths during winter. Clean up any spills in your garage right away. This includes oil or grease spills. What can I do in the bathroom? Use night lights. Install grab bars by the toilet and in the tub and shower. Do not use towel bars as grab bars. Use non-skid mats or decals in the tub or shower. If you need to sit down in the shower, use a plastic, non-slip stool. Keep the floor dry. Clean up any water that spills on the floor as soon as it happens. Remove soap buildup in the tub or shower regularly. Attach bath mats securely with  double-sided non-slip rug tape. Do not have throw rugs and other things on the floor that can make you trip. What can I do in the bedroom? Use night lights. Make sure that you have a light by your bed that is easy to reach. Do not use any sheets or blankets that are too big for your bed. They should not hang down onto the floor. Have a firm chair that has side arms. You can use this for support while you get dressed. Do not have throw rugs and other things on the floor that can make you trip. What can I do in the kitchen? Clean up any spills right away. Avoid walking on wet floors. Keep items that you use a lot in easy-to-reach places. If you need to reach something above you, use a strong step stool that has a grab bar. Keep electrical cords out of the way. Do not use floor polish or wax that makes floors slippery. If you must use wax, use non-skid floor wax. Do not have throw rugs and other things on the floor that can make you trip. What can I do with my stairs? Do not leave any items on the stairs. Make sure  that there are handrails on both sides of the stairs and use them. Fix handrails that are broken or loose. Make sure that handrails are as long as the stairways. Check any carpeting to make sure that it is firmly attached to the stairs. Fix any carpet that is loose or worn. Avoid having throw rugs at the top or bottom of the stairs. If you do have throw rugs, attach them to the floor with carpet tape. Make sure that you have a light switch at the top of the stairs and the bottom of the stairs. If you do not have them, ask someone to add them for you. What else can I do to help prevent falls? Wear shoes that: Do not have high heels. Have rubber bottoms. Are comfortable and fit you well. Are closed at the toe. Do not wear sandals. If you use a stepladder: Make sure that it is fully opened. Do not climb a closed stepladder. Make sure that both sides of the stepladder are locked  into place. Ask someone to hold it for you, if possible. Clearly mark and make sure that you can see: Any grab bars or handrails. First and last steps. Where the edge of each step is. Use tools that help you move around (mobility aids) if they are needed. These include: Canes. Walkers. Scooters. Crutches. Turn on the lights when you go into a dark area. Replace any light bulbs as soon as they burn out. Set up your furniture so you have a clear path. Avoid moving your furniture around. If any of your floors are uneven, fix them. If there are any pets around you, be aware of where they are. Review your medicines with your doctor. Some medicines can make you feel dizzy. This can increase your chance of falling. Ask your doctor what other things that you can do to help prevent falls. This information is not intended to replace advice given to you by your health care provider. Make sure you discuss any questions you have with your health care provider. Document Released: 03/29/2009 Document Revised: 11/08/2015 Document Reviewed: 07/07/2014 Elsevier Interactive Patient Education  2017 [employer].

## 2023-02-19 NOTE — Patient Instructions (Signed)
Increase pantoprazole to 40 mg twice daily for increased heart burn.

## 2023-02-21 DIAGNOSIS — R202 Paresthesia of skin: Secondary | ICD-10-CM | POA: Insufficient documentation

## 2023-02-21 NOTE — Assessment & Plan Note (Signed)
Check labs 

## 2023-02-21 NOTE — Assessment & Plan Note (Signed)
Increase pantoprazole to 40 mg twice daily for increased heart burn.

## 2023-02-22 ENCOUNTER — Encounter: Payer: Self-pay | Admitting: Family Medicine

## 2023-02-23 DIAGNOSIS — E119 Type 2 diabetes mellitus without complications: Secondary | ICD-10-CM | POA: Diagnosis not present

## 2023-02-23 DIAGNOSIS — D3131 Benign neoplasm of right choroid: Secondary | ICD-10-CM | POA: Diagnosis not present

## 2023-02-23 LAB — COMPREHENSIVE METABOLIC PANEL
ALT: 16 IU/L (ref 0–32)
AST: 21 IU/L (ref 0–40)
Albumin: 4.8 g/dL (ref 3.8–4.8)
Alkaline Phosphatase: 59 IU/L (ref 44–121)
BUN/Creatinine Ratio: 18 (ref 12–28)
BUN: 27 mg/dL (ref 8–27)
Bilirubin Total: 1.3 mg/dL — ABNORMAL HIGH (ref 0.0–1.2)
CO2: 23 mmol/L (ref 20–29)
Calcium: 10.1 mg/dL (ref 8.7–10.3)
Chloride: 103 mmol/L (ref 96–106)
Creatinine, Ser: 1.53 mg/dL — ABNORMAL HIGH (ref 0.57–1.00)
Globulin, Total: 2.4 g/dL (ref 1.5–4.5)
Glucose: 136 mg/dL — ABNORMAL HIGH (ref 70–99)
Potassium: 4.8 mmol/L (ref 3.5–5.2)
Sodium: 141 mmol/L (ref 134–144)
Total Protein: 7.2 g/dL (ref 6.0–8.5)
eGFR: 35 mL/min/{1.73_m2} — ABNORMAL LOW (ref >59)

## 2023-02-23 LAB — CBC WITH DIFFERENTIAL/PLATELET
Basophils Absolute: 0 10*3/uL (ref 0.0–0.2)
Basos: 1 %
EOS (ABSOLUTE): 0.1 10*3/uL (ref 0.0–0.4)
Eos: 2 %
Hematocrit: 42.5 % (ref 34.0–46.6)
Hemoglobin: 13.9 g/dL (ref 11.1–15.9)
Immature Grans (Abs): 0 10*3/uL (ref 0.0–0.1)
Immature Granulocytes: 0 %
Lymphocytes Absolute: 1.5 10*3/uL (ref 0.7–3.1)
Lymphs: 25 %
MCH: 29.6 pg (ref 26.6–33.0)
MCHC: 32.7 g/dL (ref 31.5–35.7)
MCV: 90 fL (ref 79–97)
Monocytes Absolute: 0.3 10*3/uL (ref 0.1–0.9)
Monocytes: 5 %
Neutrophils Absolute: 4.2 10*3/uL (ref 1.4–7.0)
Neutrophils: 67 %
Platelets: 158 10*3/uL (ref 150–450)
RBC: 4.7 x10E6/uL (ref 3.77–5.28)
RDW: 12.4 % (ref 11.7–15.4)
WBC: 6.2 10*3/uL (ref 3.4–10.8)

## 2023-02-23 LAB — LIPID PANEL
Chol/HDL Ratio: 2.6 ratio (ref 0.0–4.4)
Cholesterol, Total: 128 mg/dL (ref 100–199)
HDL: 50 mg/dL (ref >39)
LDL Chol Calc (NIH): 61 mg/dL (ref 0–99)
Triglycerides: 92 mg/dL (ref 0–149)
VLDL Cholesterol Cal: 17 mg/dL (ref 5–40)

## 2023-02-23 LAB — HEMOGLOBIN A1C
Est. average glucose Bld gHb Est-mCnc: 146 mg/dL
Hgb A1c MFr Bld: 6.7 % — ABNORMAL HIGH (ref 4.8–5.6)

## 2023-02-23 LAB — B12 AND FOLATE PANEL
Folate: 15.6 ng/mL (ref >3.0)
Vitamin B-12: 313 pg/mL (ref 232–1245)

## 2023-02-23 LAB — HM DIABETES EYE EXAM

## 2023-02-23 LAB — METHYLMALONIC ACID, SERUM: Methylmalonic Acid: 941 nmol/L — ABNORMAL HIGH (ref 0–378)

## 2023-02-23 LAB — LITHOLINK CKD PROGRAM

## 2023-03-02 ENCOUNTER — Telehealth: Payer: Self-pay | Admitting: Family Medicine

## 2023-03-02 NOTE — Telephone Encounter (Signed)
   Alicia Jordan has been scheduled for the following appointment:  WHAT: MAMMOGRAM AND BONE DENSITY WHERE: [facility] DATE: 03/25/23 TIME: 2:30 PM CHECK-IN  Patient has been made aware.

## 2023-03-04 ENCOUNTER — Other Ambulatory Visit: Payer: Self-pay

## 2023-03-04 DIAGNOSIS — Z1231 Encounter for screening mammogram for malignant neoplasm of breast: Secondary | ICD-10-CM

## 2023-03-04 DIAGNOSIS — N959 Unspecified menopausal and perimenopausal disorder: Secondary | ICD-10-CM

## 2023-03-05 ENCOUNTER — Other Ambulatory Visit (HOSPITAL_COMMUNITY): Payer: Self-pay

## 2023-03-05 ENCOUNTER — Other Ambulatory Visit (HOSPITAL_BASED_OUTPATIENT_CLINIC_OR_DEPARTMENT_OTHER): Payer: Self-pay

## 2023-03-07 ENCOUNTER — Other Ambulatory Visit (HOSPITAL_COMMUNITY): Payer: Self-pay

## 2023-03-13 DIAGNOSIS — H2513 Age-related nuclear cataract, bilateral: Secondary | ICD-10-CM | POA: Diagnosis not present

## 2023-03-13 DIAGNOSIS — H2511 Age-related nuclear cataract, right eye: Secondary | ICD-10-CM | POA: Diagnosis not present

## 2023-03-13 DIAGNOSIS — Z01818 Encounter for other preprocedural examination: Secondary | ICD-10-CM | POA: Diagnosis not present

## 2023-03-15 IMAGING — US US RENAL
1 series · 14 of 25 positions shown · non-contrast
Comparison: None.

CLINICAL DATA: Hypertensive kidney disease. Stage III B chronic
kidney disease.

EXAM:
RENAL / URINARY TRACT ULTRASOUND COMPLETE

[Series 1: us renal · 0.23mm/px · 14 of 36 slices shown]
[im 1/36]
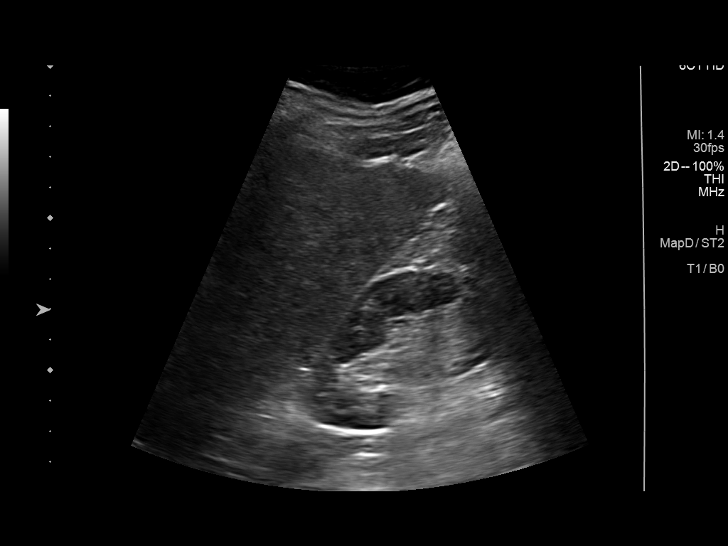
[im 3/36]
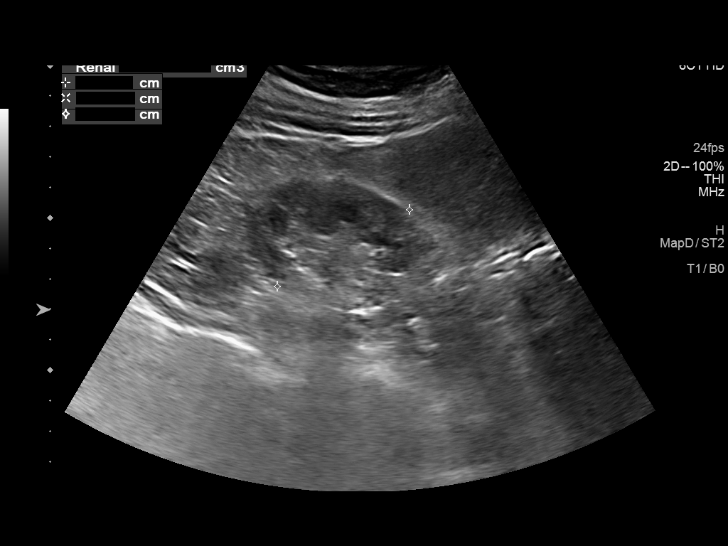
[im 6/36]
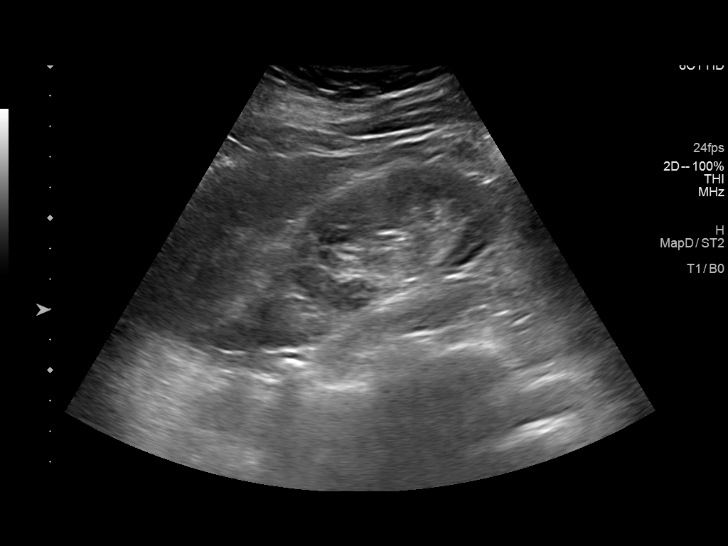
[im 9/36]
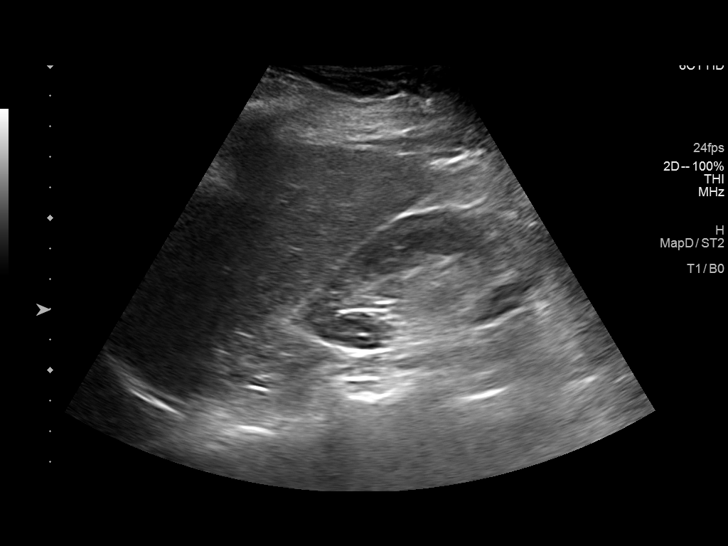
[im 12/36]
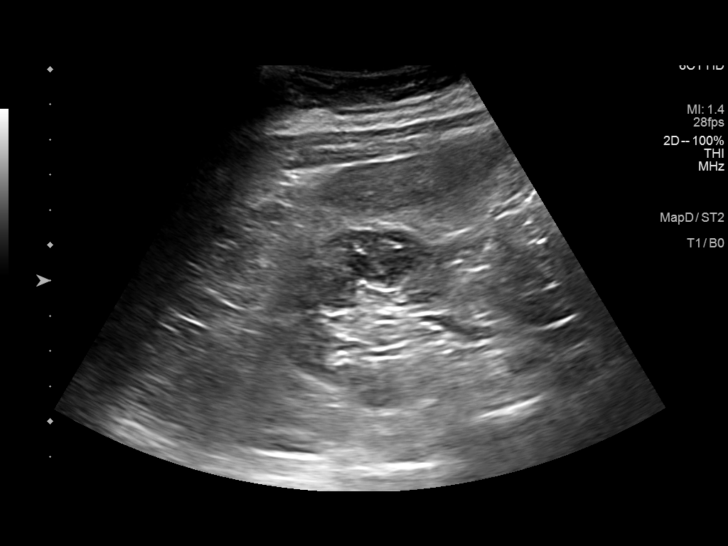
[im 14/36]
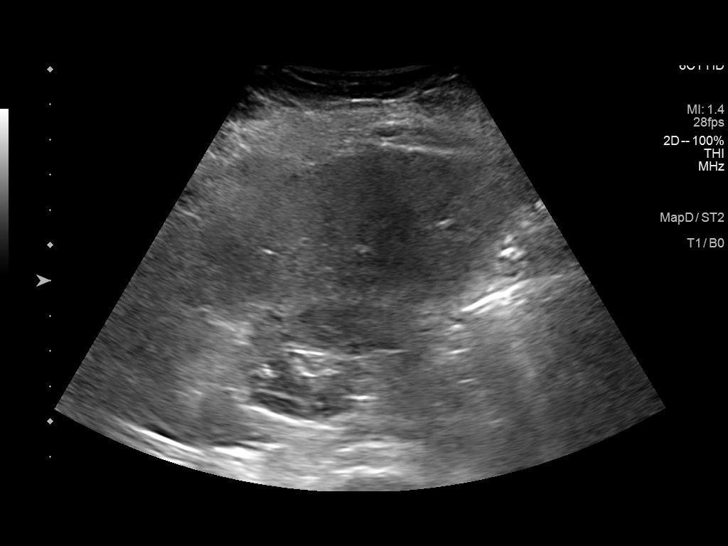
[im 17/36]
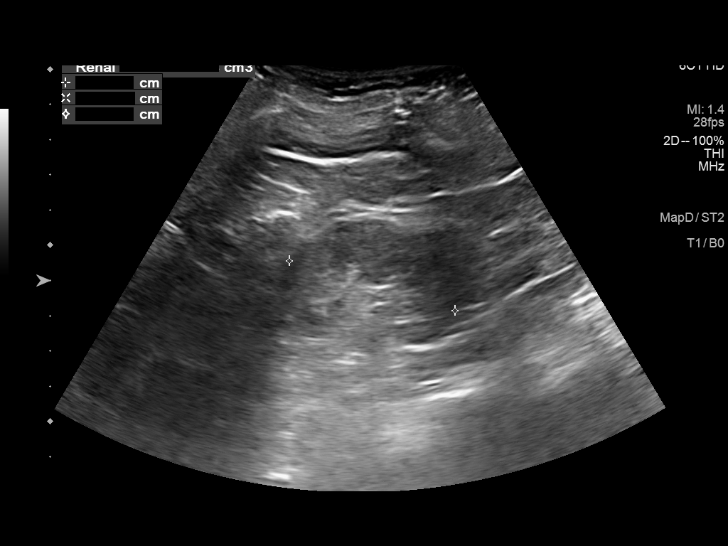
[im 19/36]
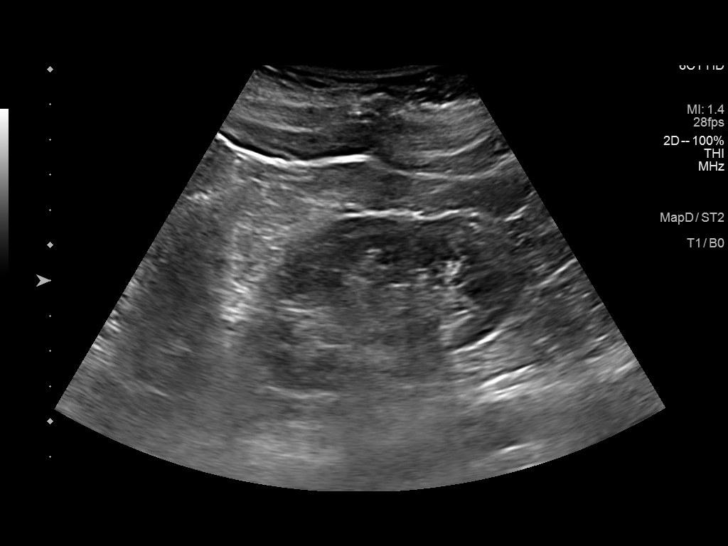
[im 22/36]
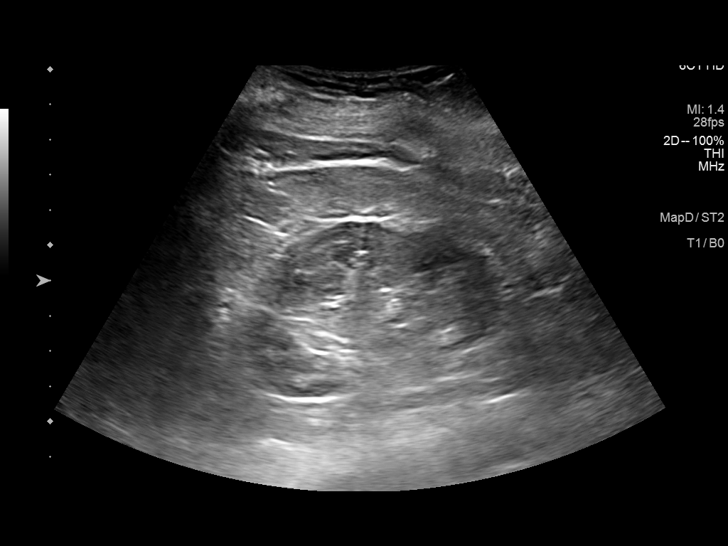
[im 24/36]
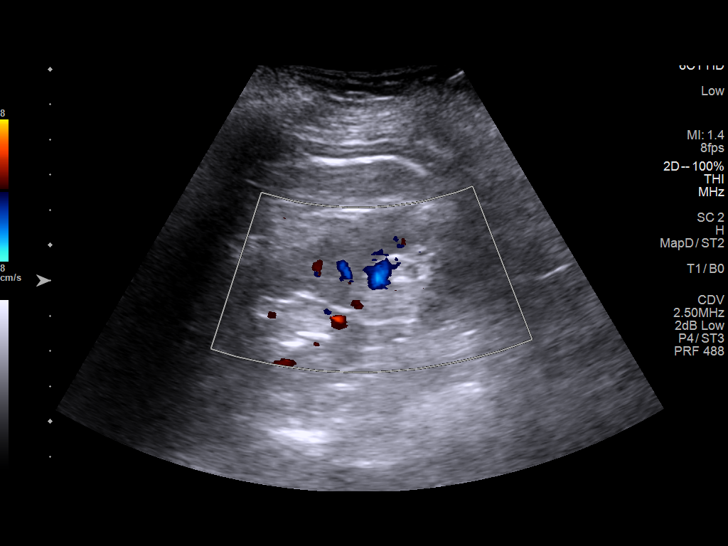
[im 27/36]
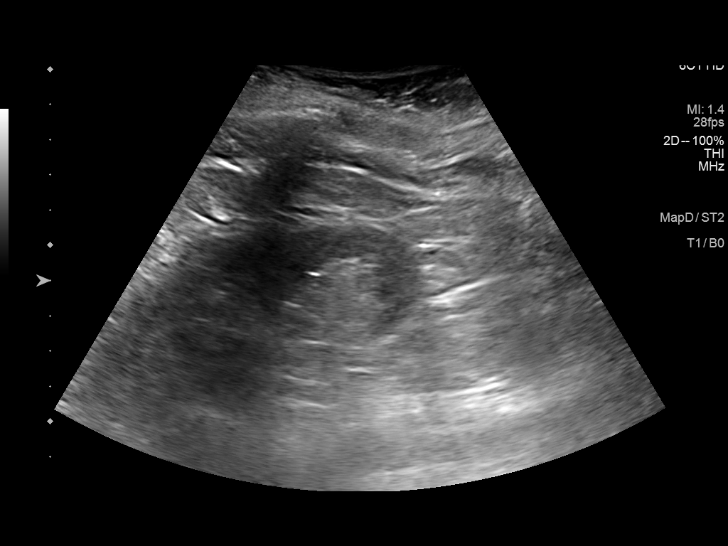
[im 30/36]
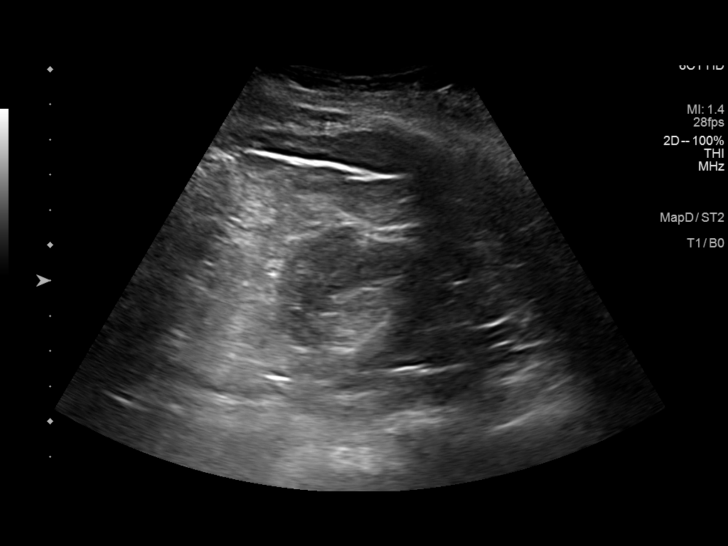
[im 33/36]
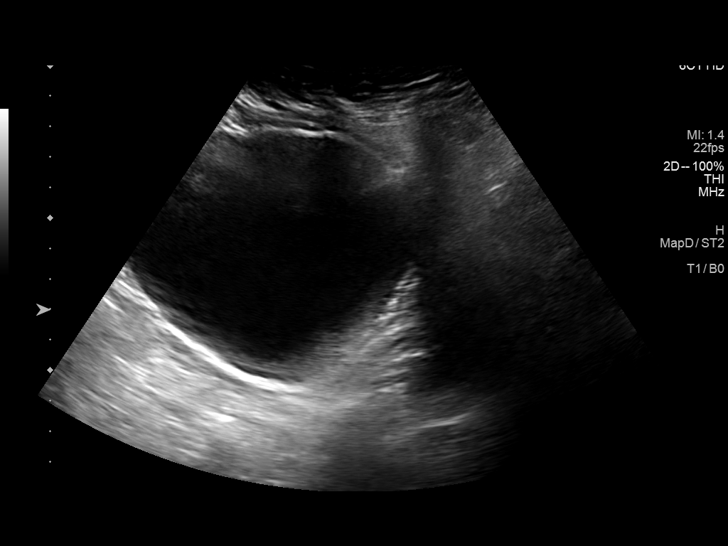
[im 36/36]
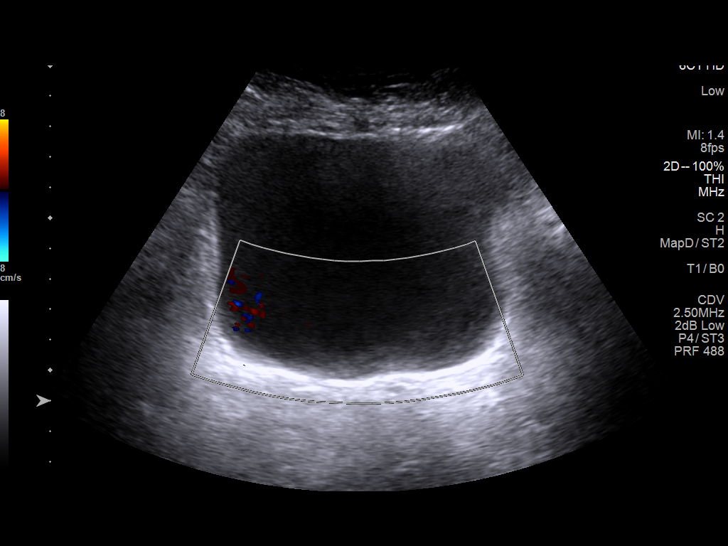

[14 of 25 positions shown; findings below may reference images not displayed]

FINDINGS: Right Kidney:

Renal measurements: 9.0 x 4.1 x 5.0 cm = volume: 98 mL. Echogenicity
within normal limits. No mass or hydronephrosis visualized.

Left Kidney:

Renal measurements: 8.7 x 4.8 x 4.9 cm = volume: 107 mL.
Echogenicity within normal limits. No mass or hydronephrosis
visualized.

Bladder:

Appears normal for degree of bladder distention.

Other:

None.
IMPRESSION: No significant sonographic abnormality of the kidneys.

## 2023-03-16 DIAGNOSIS — N1832 Chronic kidney disease, stage 3b: Secondary | ICD-10-CM | POA: Diagnosis not present

## 2023-03-18 ENCOUNTER — Other Ambulatory Visit: Payer: Self-pay | Admitting: Family Medicine

## 2023-03-18 DIAGNOSIS — H269 Unspecified cataract: Secondary | ICD-10-CM | POA: Diagnosis not present

## 2023-03-18 DIAGNOSIS — H2511 Age-related nuclear cataract, right eye: Secondary | ICD-10-CM | POA: Diagnosis not present

## 2023-03-18 HISTORY — PX: CATARACT EXTRACTION W/ INTRAOCULAR LENS IMPLANT: SHX1309

## 2023-03-24 DIAGNOSIS — N1832 Chronic kidney disease, stage 3b: Secondary | ICD-10-CM | POA: Diagnosis not present

## 2023-03-24 DIAGNOSIS — I129 Hypertensive chronic kidney disease with stage 1 through stage 4 chronic kidney disease, or unspecified chronic kidney disease: Secondary | ICD-10-CM | POA: Diagnosis not present

## 2023-03-24 DIAGNOSIS — E1122 Type 2 diabetes mellitus with diabetic chronic kidney disease: Secondary | ICD-10-CM | POA: Diagnosis not present

## 2023-03-25 ENCOUNTER — Other Ambulatory Visit: Payer: Self-pay

## 2023-03-25 ENCOUNTER — Other Ambulatory Visit: Payer: Self-pay | Admitting: Family Medicine

## 2023-03-25 MED ORDER — DAPAGLIFLOZIN PROPANEDIOL 10 MG PO TABS: 10.0000 mg | ORAL_TABLET | Freq: Every day | ORAL | 1 refills | Status: AC

## 2023-03-25 NOTE — Progress Notes (Signed)
Refill sent to [employer] patient assistance program

## 2023-04-01 DIAGNOSIS — H2512 Age-related nuclear cataract, left eye: Secondary | ICD-10-CM | POA: Diagnosis not present

## 2023-04-01 DIAGNOSIS — H269 Unspecified cataract: Secondary | ICD-10-CM | POA: Diagnosis not present

## 2023-04-01 DIAGNOSIS — H02054 Trichiasis without entropian left upper eyelid: Secondary | ICD-10-CM | POA: Diagnosis not present

## 2023-04-01 HISTORY — PX: CATARACT EXTRACTION W/ INTRAOCULAR LENS IMPLANT: SHX1309

## 2023-04-03 ENCOUNTER — Other Ambulatory Visit (HOSPITAL_COMMUNITY): Payer: Self-pay

## 2023-04-13 ENCOUNTER — Other Ambulatory Visit: Payer: Self-pay

## 2023-04-13 ENCOUNTER — Other Ambulatory Visit (HOSPITAL_COMMUNITY): Payer: Self-pay

## 2023-04-13 ENCOUNTER — Other Ambulatory Visit: Payer: Self-pay | Admitting: Family Medicine

## 2023-04-13 NOTE — Progress Notes (Unsigned)
ferrous sulfate 325, icosapent ethyl 1 g and pantoprazole 40 mg to [facility]?

## 2023-04-14 ENCOUNTER — Other Ambulatory Visit: Payer: Self-pay | Admitting: Family Medicine

## 2023-04-14 DIAGNOSIS — E782 Mixed hyperlipidemia: Secondary | ICD-10-CM

## 2023-04-14 DIAGNOSIS — D508 Other iron deficiency anemias: Secondary | ICD-10-CM

## 2023-04-14 DIAGNOSIS — K219 Gastro-esophageal reflux disease without esophagitis: Secondary | ICD-10-CM

## 2023-04-14 MED ORDER — OMEGA-3-ACID ETHYL ESTERS 1 G PO CAPS
2.0000 g | ORAL_CAPSULE | Freq: Two times a day (BID) | ORAL | 1 refills | Status: DC
Start: 2023-04-14 — End: 2023-10-16
  Filled 2023-04-14: qty 360, 90d supply, fill #0
  Filled 2023-07-15: qty 360, 90d supply, fill #1

## 2023-04-14 MED ORDER — PANTOPRAZOLE SODIUM 40 MG PO TBEC
40.0000 mg | DELAYED_RELEASE_TABLET | Freq: Two times a day (BID) | ORAL | 0 refills | Status: DC
  Filled 2023-04-14: qty 180, 90d supply, fill #0

## 2023-04-14 MED ORDER — IRON (FERROUS SULFATE) 325 (65 FE) MG PO TABS
325.0000 mg | ORAL_TABLET | Freq: Every day | ORAL | 1 refills | Status: DC
  Filled 2023-04-14: qty 90, 90d supply, fill #0

## 2023-04-15 ENCOUNTER — Other Ambulatory Visit (HOSPITAL_BASED_OUTPATIENT_CLINIC_OR_DEPARTMENT_OTHER): Payer: Self-pay

## 2023-04-15 ENCOUNTER — Other Ambulatory Visit (HOSPITAL_COMMUNITY): Payer: Self-pay

## 2023-04-15 ENCOUNTER — Encounter: Payer: Self-pay | Admitting: Family Medicine

## 2023-04-15 DIAGNOSIS — M85852 Other specified disorders of bone density and structure, left thigh: Secondary | ICD-10-CM | POA: Diagnosis not present

## 2023-04-15 DIAGNOSIS — Z1231 Encounter for screening mammogram for malignant neoplasm of breast: Secondary | ICD-10-CM | POA: Diagnosis not present

## 2023-04-15 DIAGNOSIS — N959 Unspecified menopausal and perimenopausal disorder: Secondary | ICD-10-CM | POA: Diagnosis not present

## 2023-04-15 LAB — HM DEXA SCAN

## 2023-04-15 LAB — HM MAMMOGRAPHY

## 2023-04-20 ENCOUNTER — Encounter: Payer: Self-pay | Admitting: Family Medicine

## 2023-04-29 DIAGNOSIS — M5442 Lumbago with sciatica, left side: Secondary | ICD-10-CM | POA: Diagnosis not present

## 2023-04-29 DIAGNOSIS — M9904 Segmental and somatic dysfunction of sacral region: Secondary | ICD-10-CM | POA: Diagnosis not present

## 2023-04-29 DIAGNOSIS — M51362 Other intervertebral disc degeneration, lumbar region with discogenic back pain and lower extremity pain: Secondary | ICD-10-CM | POA: Diagnosis not present

## 2023-04-29 DIAGNOSIS — M9903 Segmental and somatic dysfunction of lumbar region: Secondary | ICD-10-CM | POA: Diagnosis not present

## 2023-04-30 ENCOUNTER — Other Ambulatory Visit: Payer: Self-pay

## 2023-04-30 ENCOUNTER — Other Ambulatory Visit (HOSPITAL_COMMUNITY): Payer: Self-pay

## 2023-04-30 ENCOUNTER — Other Ambulatory Visit: Payer: Self-pay | Admitting: Family Medicine

## 2023-04-30 MED ORDER — SITAGLIPTIN PHOSPHATE 50 MG PO TABS
50.0000 mg | ORAL_TABLET | Freq: Every day | ORAL | 0 refills | Status: DC
  Filled 2023-04-30: qty 30, 30d supply, fill #0
  Filled 2023-05-27: qty 30, 30d supply, fill #1
  Filled 2023-06-29: qty 30, 30d supply, fill #2

## 2023-05-01 ENCOUNTER — Other Ambulatory Visit (HOSPITAL_COMMUNITY): Payer: Self-pay

## 2023-05-20 ENCOUNTER — Other Ambulatory Visit (HOSPITAL_COMMUNITY): Payer: Self-pay

## 2023-05-27 ENCOUNTER — Other Ambulatory Visit: Payer: Self-pay

## 2023-05-27 ENCOUNTER — Other Ambulatory Visit (HOSPITAL_COMMUNITY): Payer: Self-pay

## 2023-05-27 DIAGNOSIS — M51362 Other intervertebral disc degeneration, lumbar region with discogenic back pain and lower extremity pain: Secondary | ICD-10-CM | POA: Diagnosis not present

## 2023-05-27 DIAGNOSIS — M5442 Lumbago with sciatica, left side: Secondary | ICD-10-CM | POA: Diagnosis not present

## 2023-05-27 DIAGNOSIS — M9904 Segmental and somatic dysfunction of sacral region: Secondary | ICD-10-CM | POA: Diagnosis not present

## 2023-05-27 DIAGNOSIS — M9903 Segmental and somatic dysfunction of lumbar region: Secondary | ICD-10-CM | POA: Diagnosis not present

## 2023-05-28 ENCOUNTER — Ambulatory Visit: Payer: PPO | Admitting: Family Medicine

## 2023-05-28 ENCOUNTER — Other Ambulatory Visit: Payer: Self-pay | Admitting: Family Medicine

## 2023-05-28 ENCOUNTER — Encounter: Payer: Self-pay | Admitting: Family Medicine

## 2023-05-28 VITALS — BP 108/60 | HR 65 | Temp 97.1°F | Ht 61.0 in | Wt 140.0 lb

## 2023-05-28 DIAGNOSIS — D508 Other iron deficiency anemias: Secondary | ICD-10-CM

## 2023-05-28 DIAGNOSIS — E782 Mixed hyperlipidemia: Secondary | ICD-10-CM

## 2023-05-28 DIAGNOSIS — F330 Major depressive disorder, recurrent, mild: Secondary | ICD-10-CM

## 2023-05-28 DIAGNOSIS — K219 Gastro-esophageal reflux disease without esophagitis: Secondary | ICD-10-CM | POA: Diagnosis not present

## 2023-05-28 DIAGNOSIS — N1832 Chronic kidney disease, stage 3b: Secondary | ICD-10-CM | POA: Diagnosis not present

## 2023-05-28 DIAGNOSIS — I129 Hypertensive chronic kidney disease with stage 1 through stage 4 chronic kidney disease, or unspecified chronic kidney disease: Secondary | ICD-10-CM

## 2023-05-28 DIAGNOSIS — E1121 Type 2 diabetes mellitus with diabetic nephropathy: Secondary | ICD-10-CM

## 2023-05-28 DIAGNOSIS — E538 Deficiency of other specified B group vitamins: Secondary | ICD-10-CM | POA: Diagnosis not present

## 2023-05-28 DIAGNOSIS — J301 Allergic rhinitis due to pollen: Secondary | ICD-10-CM | POA: Diagnosis not present

## 2023-05-28 NOTE — Progress Notes (Unsigned)
Subjective:  Patient ID: Alicia Jordan, female    DOB: 11/08/1945  Age: 77 y.o. MRN: [id number]  Chief Complaint  Patient presents with   Medical Management of Chronic Issues   Discussed the use of AI scribe software for clinical note transcription with the patient, who gave verbal consent to proceed.  History of Present Illness        HPI   Diabetes:   Complications: nephropathy. CKD 3b. Checks sugars once daily Log: 110-140. Checking feet daily.  Last HbA1c: 6.7 Eye exam: Cataract removal. No polydipsia, no polyphagia, no polyuria.  Medications: Farxiga 10 mg daily, januvia 50 mg daily. .   Hyperlipidemia:  Taking Simvastatin 40 mg daily, Zetia 10 mg daily, and Vascepa 1 gm 2 capsules twice daily.  Hypertension associated with CKD:  Losartan 100 mg daily.    Depression/Anxiety: on prozac 20 mg 2 daily. Well controlled.   GERD: Pantoprazole 40 mg daily currently controlled other than occasional gerd. Pepcid id not help.  .  Vitamin D deficiency: Vitamin D 50,000 units 1 capsule once weekly.  History of Present Illness The patient presents with chronic rhinorrhea and occasional headaches, which she attributes to allergies. She has a cat at home, which she suspects may be contributing to her symptoms. She is currently taking Zyrtec for symptom management, and occasionally uses Benadryl when symptoms are severe. She has tried Claritin in the past, but found it to be ineffective. She also has Flonase nasal spray, but does not use it regularly.  The patient also has diabetes, which she monitors daily. Her blood sugar levels range from 110 to 140, although she admits to indulging in more food than usual over the Thanksgiving holiday. She is currently taking [place] and Januvia for diabetes management.  In addition to diabetes, the patient has high cholesterol, for which she is taking simvastatin and Zetia. She also takes a prescription fish oil supplement.  The patient has  hypertension, for which she is taking losartan. She also has anxiety and depression, which are managed with Prozac. She has been prescribed Protonix and Pepcid for stomach issues, but reports that Pepcid does not seem to be very effective. She occasionally experiences stomach pain, but does not feel the need to take a second dose of Protonix on a daily basis.  The patient also takes a vitamin D supplement, an iron supplement, and Tylenol as needed.       05/28/2023   10:09 AM 02/19/2023    9:34 AM 11/05/2022    8:54 AM 07/30/2022    8:42 AM 05/14/2022    9:31 AM  Depression screen PHQ 2/9  Decreased Interest 0 0 0 0 0  Down, Depressed, Hopeless 0 0 0 0 0  PHQ - 2 Score 0 0 0 0 0  Altered sleeping 0 1  0 0  Tired, decreased energy 0 1  1 2   Change in appetite 0 0  0 1  Feeling bad or failure about yourself  0 0  0 0  Trouble concentrating 0 0  0 0  Moving slowly or fidgety/restless 0 0  0 0  Suicidal thoughts 0 0  0 0  PHQ-9 Score 0 2  1 3   Difficult doing work/chores Not difficult at all Not difficult at all  Not difficult at all Not difficult at all        05/28/2023   10:09 AM  Fall Risk   Falls in the past year? 0  Number falls in  past yr: 0  Injury with Fall? 0  Risk for fall due to : No Fall Risks  Follow up Falls evaluation completed    Patient Care Team: Blane Ohara, MD as PCP - General (Family Medicine) Lynann Bologna, MD as Consulting Physician (Gastroenterology) Linda Hedges, MD as Referring Physician (Orthopedic Surgery) Dannielle Huh, MD as Consulting Physician (Orthopedic Surgery)   Review of Systems  Constitutional:  Negative for chills, fatigue and fever.  HENT:  Negative for congestion, ear pain, rhinorrhea and sore throat.   Respiratory:  Negative for cough and shortness of breath.   Cardiovascular:  Negative for chest pain.  Gastrointestinal:  Negative for abdominal pain, constipation, diarrhea, nausea and vomiting.  Genitourinary:  Negative for dysuria  and urgency.  Musculoskeletal:  Negative for back pain and myalgias.  Neurological:  Negative for dizziness, weakness, light-headedness and headaches.  Psychiatric/Behavioral:  Negative for dysphoric mood. The patient is not nervous/anxious.     Current Outpatient Medications on File Prior to Visit  Medication Sig Dispense Refill   Acetaminophen (TYLENOL PO) Take by mouth as needed.     cetirizine (ZYRTEC) 10 MG tablet Take 1 tablet (10 mg total) by mouth daily. 90 tablet 3   cholecalciferol (VITAMIN D3) 25 MCG (1000 UNIT) tablet Take 1 tablet (1,000 Units total) by mouth daily. 180 tablet 3   dapagliflozin propanediol (FARXIGA) 10 MG TABS tablet Take 1 tablet (10 mg) by mouth daily before breakfast. 90 tablet 1   ezetimibe (ZETIA) 10 MG tablet Take 1 tablet (10 mg) by mouth daily. 90 tablet 1   famotidine (PEPCID) 20 MG tablet Take 1 tablet (20 mg) by mouth at bedtime. 90 tablet 0   FLUoxetine (PROZAC) 20 MG capsule Take 2 capsules (40 mg) by mouth daily. 180 capsule 1   glucose blood test strip 1 each by Other route 2 (two) times daily. Use as instructed 100 each 2   Iron, Ferrous Sulfate, 325 (65 Fe) MG TABS Take 325 mg by mouth daily. 90 tablet 1   losartan (COZAAR) 100 MG tablet Take 1 tablet (100 mg total) by mouth every evening. 90 tablet 1   montelukast (SINGULAIR) 10 MG tablet Take 1 tablet (10 mg total) by mouth at bedtime. 90 tablet 3   omega-3 acid ethyl esters (LOVAZA) 1 g capsule Take 2 capsules (2 g total) by mouth 2 (two) times daily. 360 capsule 1   ondansetron (ZOFRAN) 4 MG tablet Take 1 tablet (4 mg total) by mouth every 8 (eight) hours as needed for nausea or vomiting. 20 tablet 0   pantoprazole (PROTONIX) 40 MG tablet Take 1 tablet (40 mg total) by mouth 2 (two) times daily. 180 tablet 0   simvastatin (ZOCOR) 40 MG tablet TAKE 1 TABLET BY MOUTH AT BEDTIME 90 tablet 1   sitaGLIPtin (JANUVIA) 50 MG tablet Take 1 tablet by mouth once daily 90 tablet 0   vitamin B-12  (CYANOCOBALAMIN) 100 MCG tablet Take 100 mcg by mouth daily.     Vitamin D, Ergocalciferol, (DRISDOL) 1.25 MG (50000 UNIT) CAPS capsule TAKE 1 CAPSULE BY MOUTH ONCE WEEKLY ON SATURDAY 5 capsule 1   No current facility-administered medications on file prior to visit.   Past Medical History:  Diagnosis Date   Allergy    Anemia    Anxiety    Arthralgia of left temporomandibular joint    Arthritis    Cervicalgia    Chronic kidney disease, stage 3a (HCC)    Depression    Diabetes mellitus  without complication (HCC)    Hesitancy of micturition    Hyperlipidemia    Hypertension    Osteoporosis    Other psoriasis    Other specified menopausal and perimenopausal disorders    Overweight    Repeated falls    Residual hemorrhoidal skin tags    Past Surgical History:  Procedure Laterality Date   ABDOMINAL HYSTERECTOMY     APPENDECTOMY     CARPAL TUNNEL RELEASE Right    CATARACT EXTRACTION, BILATERAL Bilateral    [place] IOL implant.  Right SN [id number]     Left SN [id number]   CHOLECYSTECTOMY     COLONOSCOPY  04/09/2016   Mild sigmoid diverticulosis. Otherwise normal colonoscopy   ESOPHAGOGASTRODUODENOSCOPY  09/10/2012   No obvious esophageal stricture, status post empiric esophageal dilation. Irregular GE junction, questionable significance. Mild gastritis.   eye lid surgery     KNEE SURGERY Left    REPLACEMENT TOTAL KNEE Right 06/03/2021    Family History  Problem Relation Age of Onset   Diabetes type II Mother    Hyperlipidemia Mother    Hypertension Mother    Dementia Mother    Heart attack Father    Hyperlipidemia Father    Hypertension Father    Stroke Father    Diabetes type II Father    Colon cancer Daughter 54   Cancer Daughter    Esophageal cancer Neg Hx    Rectal cancer Neg Hx    Stomach cancer Neg Hx    Social History   Socioeconomic History   Marital status: Married    Spouse name: Jake Shark   Number of children: 4   Years of education: Not on file    Highest education level: Not on file  Occupational History   Occupation: Homemaker  Tobacco Use   Smoking status: Never   Smokeless tobacco: Never  Vaping Use   Vaping status: Never Used  Substance and Sexual Activity   Alcohol use: Never   Drug use: Never   Sexual activity: Not on file  Other Topics Concern   Not on file  Social History Narrative   Dalphine spends a lot if time helping her daughter get to and from oncology appointments   Her husband has started drinking alcohol again   Social Drivers of Health   Financial Resource Strain: Medium Risk (02/19/2023)   Overall Financial Resource Strain (CARDIA)    Difficulty of Paying Living Expenses: Somewhat hard  Food Insecurity: No Food Insecurity (02/19/2023)   Hunger Vital Sign    Worried About Running Out of Food in the Last Year: Never true    Ran Out of Food in the Last Year: Never true  Transportation Needs: No Transportation Needs (02/19/2023)   PRAPARE - Administrator, Civil Service (Medical): No    Lack of Transportation (Non-Medical): No  Physical Activity: Sufficiently Active (02/19/2023)   Exercise Vital Sign    Days of Exercise per Week: 5 days    Minutes of Exercise per Session: 30 min  Stress: No Stress Concern Present (02/19/2023)   [employer] of Occupational Health - Occupational Stress Questionnaire    Feeling of Stress : Only a little  Social Connections: Socially Integrated (02/19/2023)   Social Connection and Isolation Panel [NHANES]    Frequency of Communication with Friends and Family: More than three times a week    Frequency of Social Gatherings with Friends and Family: More than three times a week    Attends Religious  Services: More than 4 times per year    Active Member of Clubs or Organizations: Yes    Attends Banker Meetings: More than 4 times per year    Marital Status: Married    Objective:  BP 108/60   Pulse 65   Temp (!) 97.1 F (36.2 C)   Ht 5\' 1"  (1.549 m)    Wt 140 lb (63.5 kg)   SpO2 100%   BMI 26.45 kg/m      05/28/2023   10:01 AM 02/19/2023    9:59 AM 02/19/2023    9:32 AM  BP/Weight  Systolic BP 108 120 120  Diastolic BP 60 68 68  Wt. (Lbs) 140 140.3 140.2  BMI 26.45 kg/m2 26.51 kg/m2 26.49 kg/m2    Physical Exam Vitals reviewed.  Constitutional:      Appearance: Normal appearance. She is normal weight.  Neck:     Vascular: No carotid bruit.  Cardiovascular:     Rate and Rhythm: Normal rate and regular rhythm.     Heart sounds: Normal heart sounds.  Pulmonary:     Effort: Pulmonary effort is normal. No respiratory distress.     Breath sounds: Normal breath sounds.  Abdominal:     General: Abdomen is flat. Bowel sounds are normal.     Palpations: Abdomen is soft.     Tenderness: There is no abdominal tenderness.  Neurological:     Mental Status: She is alert and oriented to person, place, and time.  Psychiatric:        Mood and Affect: Mood normal.        Behavior: Behavior normal.     Diabetic Foot Exam - Simple   No data filed      Lab Results  Component Value Date   WBC 5.9 05/28/2023   HGB 13.8 05/28/2023   HCT 43.4 05/28/2023   PLT 159 05/28/2023   GLUCOSE 118 (H) 05/28/2023   CHOL 128 02/19/2023   TRIG 92 02/19/2023   HDL 50 02/19/2023   LDLCALC 61 02/19/2023   ALT 14 05/28/2023   AST 20 05/28/2023   NA 142 05/28/2023   K 4.7 05/28/2023   CL 103 05/28/2023   CREATININE 1.49 (H) 05/28/2023   BUN 26 05/28/2023   CO2 24 05/28/2023   TSH 3.260 05/28/2023   INR 1.0 12/14/2020   HGBA1C 6.8 (H) 05/28/2023   MICROALBUR 30 03/29/2021      Assessment & Plan:    Hypertensive kidney disease with stage 3b chronic kidney disease (HCC) Assessment & Plan: Well controlled.  No changes to medicines.  Continue Losartan 100 mg daily.   Continue to work on eating a healthy diet and exercise.  Labs drawn today.    Orders: -     CBC with Differential/Platelet -     Comprehensive metabolic  panel  Diabetic glomerulopathy (HCC) Assessment & Plan: Blood glucose levels ranging from 110-140 mg/dL. Checking blood glucose daily. Admitted to overeating during the holiday season. Currently on [place]. -Continue current regimen.  Orders: -     Hemoglobin A1c -     Microalbumin / creatinine urine ratio  B12 deficiency Assessment & Plan: Continue OTC vitamin B12.  Orders: -     Methylmalonic acid, serum  Mixed hyperlipidemia Assessment & Plan: Well controlled.  No changes to medicines. Continue Simvastatin 40 mg daily, Zetia 10 mg daily.  Continue to work on eating a healthy diet and exercise.  Labs drawn today.  Orders: -     TSH  Mild recurrent major depression (HCC) Assessment & Plan: On Prozac 20mg  twice daily. -Continue current regimen.   GERD without esophagitis Assessment & Plan: On Protonix 40mg  and Pepcid. Occasional upper abdominal pain. -Advise to take a second Protonix or use Tums/Rolaids as needed for occasional symptoms.   Other iron deficiency anemia Assessment & Plan: On iron supplement. Blood count to be checked today. -If blood count is normal, consider discontinuing iron supplement and recheck blood count in a month.   Non-seasonal allergic rhinitis due to pollen Assessment & Plan: Persistent rhinorrhea and occasional headaches. Currently on Zyrtec and occasional Benadryl. Has Flonase but not using regularly. -Advise regular use of Flonase nasal spray to see if symptoms improve.      Assessment & Plan Allergic Rhinitis Persistent rhinorrhea and occasional headaches. Currently on Zyrtec and occasional Benadryl. Has Flonase but not using regularly. -Advise regular use of Flonase nasal spray to see if symptoms improve.  Type 2 Diabetes Mellitus Blood glucose levels ranging from 110-140 mg/dL. Checking blood glucose daily. Admitted to overeating during the holiday season. Currently on [place]. -Continue current  regimen.  Hyperlipidemia On Simvastatin and Zetia. Also taking prescription fish oil. -Continue current regimen.  Hypertension On Losartan 100mg  daily. -Continue current regimen.  Depression/Anxiety On Prozac 20mg  twice daily. -Continue current regimen.  Gastroesophageal Reflux Disease (GERD) On Protonix 40mg  and Pepcid. Occasional upper abdominal pain. -Advise to take a second Protonix or use Tums/Rolaids as needed for occasional symptoms.  Iron Deficiency Anemia On iron supplement. Blood count to be checked today. -If blood count is normal, consider discontinuing iron supplement and recheck blood count in a month.  General Health Maintenance -Continue Vitamin D and Tylenol as needed. -Check blood count today. -Refills on medications as needed.   No orders of the defined types were placed in this encounter.   Orders Placed This Encounter  Procedures   CBC with Differential/Platelet   Comprehensive metabolic panel   Hemoglobin A1c   Methylmalonic acid, serum   TSH   Microalbumin / creatinine urine ratio     Follow-up: Return in about 3 months (around 08/26/2023).   I,Marla I Leal-Borjas,acting as a scribe for Blane Ohara, MD.,have documented all relevant documentation on the behalf of Blane Ohara, MD,as directed by  Blane Ohara, MD while in the presence of Blane Ohara, MD.   An After Visit Summary was printed and given to the patient.  Blane Ohara, MD [facility] [phone]

## 2023-05-28 NOTE — Patient Instructions (Signed)
VISIT SUMMARY:  Today, we discussed your ongoing symptoms of chronic runny nose and occasional headaches, which you believe may be due to allergies. We also reviewed your management plan for diabetes, high cholesterol, high blood pressure, anxiety, depression, and stomach issues. Additionally, we talked about your current use of various medications and supplements.  YOUR PLAN:  -ALLERGIC RHINITIS: Allergic rhinitis is an inflammation of the inside of the nose caused by an allergen, such as pollen, dust, or pet dander. You should start using Flonase nasal spray regularly to see if it helps improve your symptoms.  -TYPE 2 DIABETES MELLITUS: Type 2 diabetes is a condition that affects the way your body processes blood sugar. Continue with your current medications, Farxiga and Januvia, and keep monitoring your blood glucose levels daily.  -HYPERLIPIDEMIA: Hyperlipidemia means having high levels of fats (lipids) in your blood, such as cholesterol and triglycerides. Continue taking Simvastatin, Zetia, and your prescription fish oil supplement.  -HYPERTENSION: Hypertension is high blood pressure, which can lead to serious health issues if not managed. Continue taking Losartan 100mg  daily.  -DEPRESSION/ANXIETY: Depression and anxiety are mental health conditions that can affect your mood and overall well-being. Continue taking Prozac 20mg  twice daily.  -GASTROESOPHAGEAL REFLUX DISEASE (GERD): GERD is a digestive disorder that affects the ring of muscle between your esophagus and stomach, causing acid reflux and heartburn. You may take a second dose of Protonix or use Tums/Rolaids as needed for occasional symptoms.  -IRON DEFICIENCY ANEMIA: Iron deficiency anemia occurs when your body doesn't have enough iron to produce adequate levels of hemoglobin in your blood. We will check your blood count today, and if it is normal, you may consider discontinuing the iron supplement and recheck your blood count in a  month.  -GENERAL HEALTH MAINTENANCE: Continue taking your Vitamin D supplement and Tylenol as needed. We will check your blood count today and provide refills on your medications as needed.  INSTRUCTIONS:  Please start using Flonase nasal spray regularly for your allergy symptoms. Continue with your current medications for diabetes, high cholesterol, high blood pressure, anxiety, depression, and stomach issues. We will check your blood count today, and if it is normal, you may consider discontinuing the iron supplement and recheck your blood count in a month. Refills on medications will be provided as needed.

## 2023-05-29 ENCOUNTER — Telehealth: Payer: Self-pay

## 2023-05-29 NOTE — Telephone Encounter (Signed)
PAP: PAP application for [place] and Januvia, Publishing rights manager ([employer]) and [employer]) has been mailed to [employer] address on file. Will fax provider portion of application to provider's office when pt's portion is received.  Please not faxed provider portion on application to Dr.'s office

## 2023-05-31 LAB — CBC WITH DIFFERENTIAL/PLATELET
Basophils Absolute: 0 10*3/uL (ref 0.0–0.2)
Basos: 1 %
EOS (ABSOLUTE): 0.1 10*3/uL (ref 0.0–0.4)
Eos: 2 %
Hematocrit: 43.4 % (ref 34.0–46.6)
Hemoglobin: 13.8 g/dL (ref 11.1–15.9)
Immature Grans (Abs): 0 10*3/uL (ref 0.0–0.1)
Immature Granulocytes: 0 %
Lymphocytes Absolute: 1.6 10*3/uL (ref 0.7–3.1)
Lymphs: 27 %
MCH: 29.9 pg (ref 26.6–33.0)
MCHC: 31.8 g/dL (ref 31.5–35.7)
MCV: 94 fL (ref 79–97)
Monocytes Absolute: 0.3 10*3/uL (ref 0.1–0.9)
Monocytes: 5 %
Neutrophils Absolute: 3.9 10*3/uL (ref 1.4–7.0)
Neutrophils: 65 %
Platelets: 159 10*3/uL (ref 150–450)
RBC: 4.62 x10E6/uL (ref 3.77–5.28)
RDW: 13.2 % (ref 11.7–15.4)
WBC: 5.9 10*3/uL (ref 3.4–10.8)

## 2023-05-31 LAB — COMPREHENSIVE METABOLIC PANEL
ALT: 14 [IU]/L (ref 0–32)
AST: 20 [IU]/L (ref 0–40)
Albumin: 4.6 g/dL (ref 3.8–4.8)
Alkaline Phosphatase: 59 [IU]/L (ref 44–121)
BUN/Creatinine Ratio: 17 (ref 12–28)
BUN: 26 mg/dL (ref 8–27)
Bilirubin Total: 1.2 mg/dL (ref 0.0–1.2)
CO2: 24 mmol/L (ref 20–29)
Calcium: 10.1 mg/dL (ref 8.7–10.3)
Chloride: 103 mmol/L (ref 96–106)
Creatinine, Ser: 1.49 mg/dL — ABNORMAL HIGH (ref 0.57–1.00)
Globulin, Total: 2.7 g/dL (ref 1.5–4.5)
Glucose: 118 mg/dL — ABNORMAL HIGH (ref 70–99)
Potassium: 4.7 mmol/L (ref 3.5–5.2)
Sodium: 142 mmol/L (ref 134–144)
Total Protein: 7.3 g/dL (ref 6.0–8.5)
eGFR: 36 mL/min/{1.73_m2} — ABNORMAL LOW (ref >59)

## 2023-05-31 LAB — MICROALBUMIN / CREATININE URINE RATIO
Creatinine, Urine: 72.4 mg/dL
Microalb/Creat Ratio: 5 mg/g{creat} (ref 0–29)
Microalbumin, Urine: 3.8 ug/mL

## 2023-05-31 LAB — HEMOGLOBIN A1C
Est. average glucose Bld gHb Est-mCnc: 148 mg/dL
Hgb A1c MFr Bld: 6.8 % — ABNORMAL HIGH (ref 4.8–5.6)

## 2023-05-31 LAB — TSH: TSH: 3.26 u[IU]/mL (ref 0.450–4.500)

## 2023-05-31 LAB — METHYLMALONIC ACID, SERUM: Methylmalonic Acid: 494 nmol/L — ABNORMAL HIGH (ref 0–378)

## 2023-05-31 NOTE — Assessment & Plan Note (Signed)
On Prozac 20mg  twice daily. -Continue current regimen.

## 2023-05-31 NOTE — Assessment & Plan Note (Signed)
On Protonix 40mg  and Pepcid. Occasional upper abdominal pain. -Advise to take a second Protonix or use Tums/Rolaids as needed for occasional symptoms.

## 2023-05-31 NOTE — Assessment & Plan Note (Signed)
Blood glucose levels ranging from 110-140 mg/dL. Checking blood glucose daily. Admitted to overeating during the holiday season. Currently on [place]. -Continue current regimen.

## 2023-05-31 NOTE — Assessment & Plan Note (Signed)
Well controlled.  No changes to medicines. Continue Simvastatin 40 mg daily, Zetia 10 mg daily.  Continue to work on eating a healthy diet and exercise.  Labs drawn today.

## 2023-05-31 NOTE — Assessment & Plan Note (Signed)
On iron supplement. Blood count to be checked today. -If blood count is normal, consider discontinuing iron supplement and recheck blood count in a month.

## 2023-05-31 NOTE — Assessment & Plan Note (Signed)
Well controlled.  No changes to medicines.  Continue Losartan 100 mg daily.   Continue to work on eating a healthy diet and exercise.  Labs drawn today.

## 2023-05-31 NOTE — Assessment & Plan Note (Signed)
Continue OTC vitamin B12. 

## 2023-05-31 NOTE — Assessment & Plan Note (Signed)
Persistent rhinorrhea and occasional headaches. Currently on Zyrtec and occasional Benadryl. Has Flonase but not using regularly. -Advise regular use of Flonase nasal spray to see if symptoms improve.

## 2023-06-05 NOTE — Telephone Encounter (Signed)
PAP: Application for Marcelline Deist has been submitted to PAP Companies: [employer], via fax

## 2023-06-07 DIAGNOSIS — R051 Acute cough: Secondary | ICD-10-CM | POA: Diagnosis not present

## 2023-06-07 DIAGNOSIS — J069 Acute upper respiratory infection, unspecified: Secondary | ICD-10-CM | POA: Diagnosis not present

## 2023-06-07 DIAGNOSIS — R0981 Nasal congestion: Secondary | ICD-10-CM | POA: Diagnosis not present

## 2023-06-16 NOTE — Telephone Encounter (Signed)
PAP: Application for Januvia has been submitted to PAP Companies: [employer], via fax

## 2023-06-18 ENCOUNTER — Other Ambulatory Visit: Payer: Self-pay

## 2023-06-18 ENCOUNTER — Other Ambulatory Visit: Payer: Self-pay | Admitting: Family Medicine

## 2023-06-18 ENCOUNTER — Other Ambulatory Visit (HOSPITAL_COMMUNITY): Payer: Self-pay

## 2023-06-18 MED ORDER — VITAMIN D (ERGOCALCIFEROL) 1.25 MG (50000 UNIT) PO CAPS
ORAL_CAPSULE | ORAL | 1 refills | Status: DC
  Filled 2023-06-18: qty 5, 35d supply, fill #0
  Filled 2023-07-30: qty 5, 35d supply, fill #1

## 2023-06-24 DIAGNOSIS — M9903 Segmental and somatic dysfunction of lumbar region: Secondary | ICD-10-CM | POA: Diagnosis not present

## 2023-06-24 DIAGNOSIS — M51362 Other intervertebral disc degeneration, lumbar region with discogenic back pain and lower extremity pain: Secondary | ICD-10-CM | POA: Diagnosis not present

## 2023-06-24 DIAGNOSIS — M5442 Lumbago with sciatica, left side: Secondary | ICD-10-CM | POA: Diagnosis not present

## 2023-06-24 DIAGNOSIS — M9904 Segmental and somatic dysfunction of sacral region: Secondary | ICD-10-CM | POA: Diagnosis not present

## 2023-06-29 ENCOUNTER — Other Ambulatory Visit (HOSPITAL_BASED_OUTPATIENT_CLINIC_OR_DEPARTMENT_OTHER): Payer: Self-pay

## 2023-07-01 ENCOUNTER — Other Ambulatory Visit (HOSPITAL_COMMUNITY): Payer: Self-pay

## 2023-07-07 NOTE — Telephone Encounter (Signed)
PAP: Patient assistance application Alicia Jordan for has been approved by PAP Companies: [employer] from 06/17/2023 to 06/15/2024. Medication should be delivered to PAP Delivery: Home For further shipping updates, please contact [employer] ([employer]) at [phone] Pt ID is: [id number]  Spoke with Marve at [facility]  Attestation form was mailed on 1/3 to patient.  Patient did not receive so they are mailing a duplicate attestation form 1/21

## 2023-07-15 ENCOUNTER — Other Ambulatory Visit (HOSPITAL_COMMUNITY): Payer: Self-pay

## 2023-07-15 ENCOUNTER — Other Ambulatory Visit: Payer: Self-pay

## 2023-07-15 ENCOUNTER — Other Ambulatory Visit: Payer: Self-pay | Admitting: Family Medicine

## 2023-07-15 DIAGNOSIS — K219 Gastro-esophageal reflux disease without esophagitis: Secondary | ICD-10-CM

## 2023-07-15 MED ORDER — PANTOPRAZOLE SODIUM 40 MG PO TBEC
40.0000 mg | DELAYED_RELEASE_TABLET | Freq: Two times a day (BID) | ORAL | 0 refills | Status: DC
  Filled 2023-07-15: qty 180, 90d supply, fill #0

## 2023-07-20 DIAGNOSIS — N1832 Chronic kidney disease, stage 3b: Secondary | ICD-10-CM | POA: Diagnosis not present

## 2023-07-22 DIAGNOSIS — M9903 Segmental and somatic dysfunction of lumbar region: Secondary | ICD-10-CM | POA: Diagnosis not present

## 2023-07-22 DIAGNOSIS — M9904 Segmental and somatic dysfunction of sacral region: Secondary | ICD-10-CM | POA: Diagnosis not present

## 2023-07-22 DIAGNOSIS — M51362 Other intervertebral disc degeneration, lumbar region with discogenic back pain and lower extremity pain: Secondary | ICD-10-CM | POA: Diagnosis not present

## 2023-07-22 DIAGNOSIS — M5442 Lumbago with sciatica, left side: Secondary | ICD-10-CM | POA: Diagnosis not present

## 2023-07-27 DIAGNOSIS — E1122 Type 2 diabetes mellitus with diabetic chronic kidney disease: Secondary | ICD-10-CM | POA: Diagnosis not present

## 2023-07-27 DIAGNOSIS — N1832 Chronic kidney disease, stage 3b: Secondary | ICD-10-CM | POA: Diagnosis not present

## 2023-07-27 DIAGNOSIS — I129 Hypertensive chronic kidney disease with stage 1 through stage 4 chronic kidney disease, or unspecified chronic kidney disease: Secondary | ICD-10-CM | POA: Diagnosis not present

## 2023-07-29 NOTE — Telephone Encounter (Signed)
PAP: Patient assistance application for Januvia has been approved by PAP Companies: [employer] from 07/28/2023 to 06/15/2024. Medication should be delivered to PAP Delivery: Home. For further shipping updates, please contact [employer] at [phone]. Patient ID is: no pt. ID

## 2023-07-29 NOTE — Progress Notes (Signed)
Pharmacy Medication Assistance Program Note    07/29/2023  Patient ID: ANAROSA KUBISIAK, female   DOB: 09/17/1945, 78 y.o.   MRN: [id number]     05/29/2023  Outreach Medication One  Initial Outreach Date (Medication One) 05/29/2023  Manufacturer Medication One [employer]  [employer] [facility] Farxiga  Dose of Farxiga 10mg /day  Type of Assistance Manufacturer Assistance  Date Application Sent to Patient 05/29/2023  Application Items Requested Application;Proof of Income  Date Application Sent to Prescriber 05/29/2023  Name of Prescriber Kirsten Cox  Date Application Received From Patient 06/05/2023  Application Items Received From Patient Application;Proof of Income  Date Application Received From Provider 06/01/2023  Date Application Submitted to Manufacturer 06/05/2023  Method Application Sent to Manufacturer Fax  Patient Assistance Determination Approved  Approval Start Date 06/17/2023  Approval End Date 06/15/2024       05/29/2023  Outreach Medication Two  Manufacturer Medication Two [employer]  [employer] [facility] Januvia  Dose of Januvia 50mg /day  Type of Radiographer, therapeutic Assistance  Date Application Sent to Patient 05/29/2023  Application Items Requested Application;Proof of Income  Date Application Sent to Prescriber 05/29/2023  Name of Prescriber Kirsten Cox  Date Application Received From Patient 06/16/2023  Date Application Received From Provider 06/16/2023  Method Application Sent to Manufacturer Fax  Date Application Submitted to Manufacturer 06/16/2023  Patient Assistance Determination Approved  Approval Start Date 07/28/2023  Patient Notification Method Telephone Call  Telephone Call Outcome Successful     Signature

## 2023-07-30 ENCOUNTER — Other Ambulatory Visit (HOSPITAL_COMMUNITY): Payer: Self-pay

## 2023-07-30 ENCOUNTER — Other Ambulatory Visit: Payer: Self-pay | Admitting: Family Medicine

## 2023-07-31 ENCOUNTER — Other Ambulatory Visit (HOSPITAL_COMMUNITY): Payer: Self-pay

## 2023-07-31 ENCOUNTER — Other Ambulatory Visit: Payer: Self-pay

## 2023-07-31 MED ORDER — EZETIMIBE 10 MG PO TABS
10.0000 mg | ORAL_TABLET | Freq: Every day | ORAL | 1 refills | Status: AC
Start: 2023-07-31 — End: ?
  Filled 2023-07-31: qty 90, 90d supply, fill #0
  Filled 2023-11-05: qty 90, 90d supply, fill #1

## 2023-07-31 MED ORDER — SITAGLIPTIN PHOSPHATE 50 MG PO TABS
50.0000 mg | ORAL_TABLET | Freq: Every day | ORAL | 0 refills | Status: DC
  Filled 2023-07-31: qty 90, 90d supply, fill #0

## 2023-08-03 NOTE — Progress Notes (Unsigned)
Acute Office Visit  Subjective:    Patient ID: Alicia Jordan, female    DOB: Sep 23, 1945, 78 y.o.   MRN: [id number]  No chief complaint on file.   Discussed the use of AI scribe software for clinical note transcription with the patient, who gave verbal consent to proceed.      HPI: Patient is in today for ***  Past Medical History:  Diagnosis Date   Allergy    Anemia    Anxiety    Arthralgia of left temporomandibular joint    Arthritis    Cervicalgia    Chronic kidney disease, stage 3a (HCC)    Depression    Diabetes mellitus without complication (HCC)    Hesitancy of micturition    Hyperlipidemia    Hypertension    Osteoporosis    Other psoriasis    Other specified menopausal and perimenopausal disorders    Overweight    Repeated falls    Residual hemorrhoidal skin tags     Past Surgical History:  Procedure Laterality Date   ABDOMINAL HYSTERECTOMY     APPENDECTOMY     CARPAL TUNNEL RELEASE Right    CATARACT EXTRACTION, BILATERAL Bilateral    Clareon IOL implant.  Right SN [id number]     Left SN [id number]   CHOLECYSTECTOMY     COLONOSCOPY  04/09/2016   Mild sigmoid diverticulosis. Otherwise normal colonoscopy   ESOPHAGOGASTRODUODENOSCOPY  09/10/2012   No obvious esophageal stricture, status post empiric esophageal dilation. Irregular GE junction, questionable significance. Mild gastritis.   eye lid surgery     KNEE SURGERY Left    REPLACEMENT TOTAL KNEE Right 06/03/2021    Family History  Problem Relation Age of Onset   Diabetes type II Mother    Hyperlipidemia Mother    Hypertension Mother    Dementia Mother    Heart attack Father    Hyperlipidemia Father    Hypertension Father    Stroke Father    Diabetes type II Father    Colon cancer Daughter 34   Cancer Daughter    Esophageal cancer Neg Hx    Rectal cancer Neg Hx    Stomach cancer Neg Hx     Social History   Socioeconomic History   Marital status: Married    Spouse name:  Jake Shark   Number of children: 4   Years of education: Not on file   Highest education level: Not on file  Occupational History   Occupation: Homemaker  Tobacco Use   Smoking status: Never   Smokeless tobacco: Never  Vaping Use   Vaping status: Never Used  Substance and Sexual Activity   Alcohol use: Never   Drug use: Never   Sexual activity: Not on file  Other Topics Concern   Not on file  Social History Narrative   Nellie spends a lot if time helping her daughter get to and from oncology appointments   Her husband has started drinking alcohol again   Social Drivers of Health   Financial Resource Strain: Medium Risk (02/19/2023)   Overall Financial Resource Strain (CARDIA)    Difficulty of Paying Living Expenses: Somewhat hard  Food Insecurity: No Food Insecurity (02/19/2023)   Hunger Vital Sign    Worried About Running Out of Food in the Last Year: Never true    Ran Out of Food in the Last Year: Never true  Transportation Needs: No Transportation Needs (02/19/2023)   PRAPARE - Administrator, Civil Service (Medical):  No    Lack of Transportation (Non-Medical): No  Physical Activity: Sufficiently Active (02/19/2023)   Exercise Vital Sign    Days of Exercise per Week: 5 days    Minutes of Exercise per Session: 30 min  Stress: No Stress Concern Present (02/19/2023)   [employer] of Occupational Health - Occupational Stress Questionnaire    Feeling of Stress : Only a little  Social Connections: Socially Integrated (02/19/2023)   Social Connection and Isolation Panel [NHANES]    Frequency of Communication with Friends and Family: More than three times a week    Frequency of Social Gatherings with Friends and Family: More than three times a week    Attends Religious Services: More than 4 times per year    Active Member of [employer] or Organizations: Yes    Attends Engineer, structural: More than 4 times per year    Marital Status: Married  Catering manager  Violence: Not At Risk (02/19/2023)   Humiliation, Afraid, Rape, and Kick questionnaire    Fear of Current or Ex-Partner: No    Emotionally Abused: No    Physically Abused: No    Sexually Abused: No    Outpatient Medications Prior to Visit  Medication Sig Dispense Refill   Acetaminophen (TYLENOL PO) Take by mouth as needed.     cetirizine (ZYRTEC) 10 MG tablet Take 1 tablet (10 mg total) by mouth daily. 90 tablet 3   cholecalciferol (VITAMIN D3) 25 MCG (1000 UNIT) tablet Take 1 tablet (1,000 Units total) by mouth daily. 180 tablet 3   dapagliflozin propanediol (FARXIGA) 10 MG TABS tablet Take 1 tablet (10 mg) by mouth daily before breakfast. 90 tablet 1   ezetimibe (ZETIA) 10 MG tablet Take 1 tablet (10 mg) by mouth daily. 90 tablet 1   famotidine (PEPCID) 20 MG tablet Take 1 tablet (20 mg) by mouth at bedtime. 90 tablet 0   FLUoxetine (PROZAC) 20 MG capsule Take 2 capsules (40 mg) by mouth daily. 180 capsule 1   glucose blood test strip 1 each by Other route 2 (two) times daily. Use as instructed 100 each 2   Iron, Ferrous Sulfate, 325 (65 Fe) MG TABS Take 325 mg by mouth daily. 90 tablet 1   losartan (COZAAR) 100 MG tablet Take 1 tablet (100 mg total) by mouth every evening. 90 tablet 1   montelukast (SINGULAIR) 10 MG tablet Take 1 tablet (10 mg total) by mouth at bedtime. 90 tablet 3   omega-3 acid ethyl esters (LOVAZA) 1 g capsule Take 2 capsules (2 g total) by mouth 2 (two) times daily. 360 capsule 1   ondansetron (ZOFRAN) 4 MG tablet Take 1 tablet (4 mg total) by mouth every 8 (eight) hours as needed for nausea or vomiting. 20 tablet 0   pantoprazole (PROTONIX) 40 MG tablet Take 1 tablet (40 mg total) by mouth 2 (two) times daily. 180 tablet 0   simvastatin (ZOCOR) 40 MG tablet TAKE 1 TABLET BY MOUTH AT BEDTIME 90 tablet 1   sitaGLIPtin (JANUVIA) 50 MG tablet Take 1 tablet by mouth once daily 90 tablet 0   vitamin B-12 (CYANOCOBALAMIN) 100 MCG tablet Take 100 mcg by mouth daily.      Vitamin D, Ergocalciferol, (DRISDOL) 1.25 MG (50000 UNIT) CAPS capsule TAKE 1 CAPSULE BY MOUTH ONCE WEEKLY ON SATURDAY 5 capsule 1   No facility-administered medications prior to visit.    Allergies  Allergen Reactions   Meloxicam Other (See Comments)   Rosuvastatin  Muscle pain   Semaglutide     Nausea, confusion, and blurred vision    Review of Systems     Objective:        05/28/2023   10:01 AM 02/19/2023    9:59 AM 02/19/2023    9:32 AM  Vitals with BMI  Height 5\' 1"  5\' 1"  5\' 1"   Weight 140 lbs 140 lbs 5 oz 140 lbs 3 oz  BMI 26.47 26.52 26.5  Systolic 108 120 604  Diastolic 60 68 68  Pulse 65 80 80    No data found.   Physical Exam  There are no preventive care reminders to display for this patient.  There are no preventive care reminders to display for this patient.   Lab Results  Component Value Date   TSH 3.260 05/28/2023   Lab Results  Component Value Date   WBC 5.9 05/28/2023   HGB 13.8 05/28/2023   HCT 43.4 05/28/2023   MCV 94 05/28/2023   PLT 159 05/28/2023   Lab Results  Component Value Date   NA 142 05/28/2023   K 4.7 05/28/2023   CO2 24 05/28/2023   GLUCOSE 118 (H) 05/28/2023   BUN 26 05/28/2023   CREATININE 1.49 (H) 05/28/2023   BILITOT 1.2 05/28/2023   ALKPHOS 59 05/28/2023   AST 20 05/28/2023   ALT 14 05/28/2023   PROT 7.3 05/28/2023   ALBUMIN 4.6 05/28/2023   CALCIUM 10.1 05/28/2023   EGFR 36 (L) 05/28/2023   Lab Results  Component Value Date   CHOL 128 02/19/2023   Lab Results  Component Value Date   HDL 50 02/19/2023   Lab Results  Component Value Date   LDLCALC 61 02/19/2023   Lab Results  Component Value Date   TRIG 92 02/19/2023   Lab Results  Component Value Date   CHOLHDL 2.6 02/19/2023   Lab Results  Component Value Date   HGBA1C 6.8 (H) 05/28/2023       Assessment & Plan:  There are no diagnoses linked to this encounter.   No orders of the defined types were placed in this encounter.   No  orders of the defined types were placed in this encounter.    Follow-up: No follow-ups on file.  An After Visit Summary was printed and given to the patient.  Blane Ohara, MD [facility] [phone]

## 2023-08-04 ENCOUNTER — Encounter: Payer: Self-pay | Admitting: Family Medicine

## 2023-08-04 ENCOUNTER — Ambulatory Visit (INDEPENDENT_AMBULATORY_CARE_PROVIDER_SITE_OTHER): Payer: PPO | Admitting: Family Medicine

## 2023-08-04 VITALS — BP 132/82 | HR 74 | Temp 97.8°F | Ht 61.0 in | Wt 143.0 lb

## 2023-08-04 DIAGNOSIS — J0100 Acute maxillary sinusitis, unspecified: Secondary | ICD-10-CM

## 2023-08-04 MED ORDER — AMOXICILLIN-POT CLAVULANATE 875-125 MG PO TABS: 1.0000 | ORAL_TABLET | Freq: Two times a day (BID) | ORAL | 0 refills | Status: DC

## 2023-08-04 NOTE — Patient Instructions (Signed)
Augmentin sent  Continue nyquil in pm and mucinexDM in am. Continue singulair and zyrtec.

## 2023-08-04 NOTE — Assessment & Plan Note (Signed)
Augmentin sent  Continue nyquil in pm and mucinexDM in am. Continue singulair and zyrtec.

## 2023-08-17 ENCOUNTER — Encounter: Payer: Self-pay | Admitting: Internal Medicine

## 2023-08-17 ENCOUNTER — Ambulatory Visit: Admitting: Internal Medicine

## 2023-08-17 ENCOUNTER — Other Ambulatory Visit: Payer: Self-pay

## 2023-08-17 VITALS — BP 138/64 | HR 76 | Temp 99.2°F | Ht 61.42 in | Wt 147.4 lb

## 2023-08-17 DIAGNOSIS — R053 Chronic cough: Secondary | ICD-10-CM | POA: Diagnosis not present

## 2023-08-17 DIAGNOSIS — K219 Gastro-esophageal reflux disease without esophagitis: Secondary | ICD-10-CM

## 2023-08-17 DIAGNOSIS — J310 Chronic rhinitis: Secondary | ICD-10-CM

## 2023-08-17 MED ORDER — AZELASTINE HCL 0.1 % NA SOLN: 2.0000 | Freq: Two times a day (BID) | NASAL | 5 refills | Status: DC | PRN

## 2023-08-17 MED ORDER — IPRATROPIUM BROMIDE 0.03 % NA SOLN: 1.0000 | Freq: Three times a day (TID) | NASAL | 12 refills | Status: DC | PRN

## 2023-08-17 MED ORDER — ALBUTEROL SULFATE HFA 108 (90 BASE) MCG/ACT IN AERS: 2.0000 | INHALATION_SPRAY | Freq: Four times a day (QID) | RESPIRATORY_TRACT | 2 refills | Status: DC | PRN

## 2023-08-17 MED ORDER — SALINE SPRAY 0.65 % NA SOLN
1.0000 | NASAL | 3 refills | Status: AC | PRN
Start: 2023-08-17 — End: ?

## 2023-08-17 NOTE — Progress Notes (Signed)
 NEW PATIENT Date of Service/Encounter:  08/17/23 Referring provider: Blane Ohara, MD Primary care provider: Blane Ohara, MD  Subjective:  Alicia Jordan is a 78 y.o. female with a PMHx of hypertensive kidney disease,B12 deficiency, GERD, mixed hyperlipidemia, depression presenting today for evaluation of chronic cough and rhinitis. History obtained from: chart review and patient and daughter .   Discussed the use of AI scribe software for clinical note transcription with the patient, who gave verbal consent to proceed.  History of Present Illness   Alicia Jordan is a 78 year old female with allergies who presents with persistent runny nose, headache, and cough. She is accompanied by her daughter.  She has been experiencing a persistent runny nose, headache, and cough for approximately three to four years, which began after moving to a retirement center. She describes nasal drainage both anteriorly and posteriorly. With a history of allergies and exposure to a cat for thirteen years, she suspects these factors may contribute to her symptoms. She has tried various allergy medications, including Benadryl, Zyrtec, and Flonase, without significant relief. Antibiotics, specifically Augmentin, also did not alleviate her symptoms.  She experiences pressure in her face and head, describing it as a sensation of her head about to explode. This pressure is present at the time of the visit but is mild. She has used Mucinex and Zyrtec, and occasionally Benadryl, for symptom relief. She has also tried Flonase in the past but discontinued it, associating its use with a viral illness experienced around Christmas.  Her cough began last week, following the onset of a scratchy throat. No history of asthma is noted, but her breathing test appeared obstructed. No shortness of breath or wheezing is reported. She has had bouts of cough in the past. She has tried singulair without relief so stopped it last  fall.   She experiences heartburn and acid reflux, for which she takes Protonix 40 mg twice a day and famotidine 20 mg once a day.  Her vaccines are reported as up to date . She denies food and medication allergies.      Chart Review:  Her most recent office visit was to her primary care on 08/04/2023 treated for sinusitis with Augmentin.   Past Medical History: Past Medical History:  Diagnosis Date   Allergy    Anemia    Anxiety    Arthralgia of left temporomandibular joint    Arthritis    Cervicalgia    Chronic kidney disease, stage 3a (HCC)    Depression    Diabetes mellitus without complication (HCC)    Hesitancy of micturition    Hyperlipidemia    Hypertension    Osteoporosis    Other psoriasis    Other specified menopausal and perimenopausal disorders    Overweight    Repeated falls    Residual hemorrhoidal skin tags    Medication List:  Current Outpatient Medications  Medication Sig Dispense Refill   Acetaminophen (TYLENOL PO) Take by mouth as needed.     cetirizine (ZYRTEC) 10 MG tablet Take 1 tablet (10 mg total) by mouth daily. 90 tablet 3   cholecalciferol (VITAMIN D3) 25 MCG (1000 UNIT) tablet Take 1 tablet (1,000 Units total) by mouth daily. 180 tablet 3   dapagliflozin propanediol (FARXIGA) 10 MG TABS tablet Take 1 tablet (10 mg) by mouth daily before breakfast. 90 tablet 1   ezetimibe (ZETIA) 10 MG tablet Take 1 tablet (10 mg) by mouth daily. 90 tablet 1   famotidine (PEPCID) 20  MG tablet Take 1 tablet (20 mg) by mouth at bedtime. 90 tablet 0   FLUoxetine (PROZAC) 20 MG capsule Take 2 capsules (40 mg) by mouth daily. 180 capsule 1   glucose blood test strip 1 each by Other route 2 (two) times daily. Use as instructed 100 each 2   losartan (COZAAR) 100 MG tablet Take 1 tablet (100 mg total) by mouth every evening. 90 tablet 1   montelukast (SINGULAIR) 10 MG tablet Take 1 tablet (10 mg total) by mouth at bedtime. 90 tablet 3   omega-3 acid ethyl esters  (LOVAZA) 1 g capsule Take 2 capsules (2 g total) by mouth 2 (two) times daily. 360 capsule 1   ondansetron (ZOFRAN) 4 MG tablet Take 1 tablet (4 mg total) by mouth every 8 (eight) hours as needed for nausea or vomiting. 20 tablet 0   pantoprazole (PROTONIX) 40 MG tablet Take 1 tablet (40 mg total) by mouth 2 (two) times daily. 180 tablet 0   simvastatin (ZOCOR) 40 MG tablet TAKE 1 TABLET BY MOUTH AT BEDTIME 90 tablet 1   sitaGLIPtin (JANUVIA) 50 MG tablet Take 1 tablet by mouth once daily 90 tablet 0   vitamin B-12 (CYANOCOBALAMIN) 100 MCG tablet Take 100 mcg by mouth daily.     Vitamin D, Ergocalciferol, (DRISDOL) 1.25 MG (50000 UNIT) CAPS capsule TAKE 1 CAPSULE BY MOUTH ONCE WEEKLY ON SATURDAY 5 capsule 1   Iron, Ferrous Sulfate, 325 (65 Fe) MG TABS Take 325 mg by mouth daily. (Patient not taking: Reported on 08/17/2023) 90 tablet 1   No current facility-administered medications for this visit.   Known Allergies:  Allergies  Allergen Reactions   Meloxicam Other (See Comments)   Rosuvastatin     Muscle pain   Semaglutide     Nausea, confusion, and blurred vision   Past Surgical History: Past Surgical History:  Procedure Laterality Date   ABDOMINAL HYSTERECTOMY     APPENDECTOMY     CARPAL TUNNEL RELEASE Right    CATARACT EXTRACTION, BILATERAL Bilateral    Clareon IOL implant.  Right SN [id number]     Left SN [id number]   CHOLECYSTECTOMY     COLONOSCOPY  04/09/2016   Mild sigmoid diverticulosis. Otherwise normal colonoscopy   ESOPHAGOGASTRODUODENOSCOPY  09/10/2012   No obvious esophageal stricture, status post empiric esophageal dilation. Irregular GE junction, questionable significance. Mild gastritis.   eye lid surgery     KNEE SURGERY Left    REPLACEMENT TOTAL KNEE Right 06/03/2021   Family History: Family History  Problem Relation Age of Onset   Diabetes type II Mother    Hyperlipidemia Mother    Hypertension Mother    Dementia Mother    Heart attack Father     Hyperlipidemia Father    Hypertension Father    Stroke Father    Diabetes type II Father    Colon cancer Daughter 53   Cancer Daughter    Esophageal cancer Neg Hx    Rectal cancer Neg Hx    Stomach cancer Neg Hx    Social History: Alicia Jordan lives in an apartment with carpet in the bedroom, heat pump heating and gas, indoor cat, outdoor goat, keys, ducks, no roaches, not using dust mite covers on the beds of the pillows, no smoke exposure.  She is retired.  No HEPA filter in the home.  [place]   ROS:  All other systems negative except as noted per HPI.  Objective:  Blood pressure  138/64, pulse 76, temperature 99.2 F (37.3 C), temperature source Temporal, height 5' 1.42" (1.56 m), weight 147 lb 6.4 oz (66.9 kg), SpO2 98%. Body mass index is 27.47 kg/m. Physical Exam:  General Appearance:  Alert, cooperative, no distress, appears stated age  Head:  Normocephalic, without obvious abnormality, atraumatic  Eyes:  Conjunctiva clear, EOM's intact  Ears EACs normal bilaterally and normal TMs bilaterally  Nose: Nares normal, hypertrophic turbinates, normal mucosa, and no visible anterior polyps  Throat: Lips, tongue normal; teeth and gums normal, normal posterior oropharynx  Neck: Supple, symmetrical  Lungs:   clear to auscultation bilaterally, Respirations unlabored, no coughing  Heart:  regular rate and rhythm and no murmur, Appears well perfused  Extremities: No edema  Skin: Skin color, texture, turgor normal and no rashes or lesions on visualized portions of skin  Neurologic: No gross deficits   Diagnostics: Spirometry:  Tracings reviewed. Her effort: Good reproducible efforts. FVC: 2.22L (pre), 2.17L  (post) FEV1: 1.33L, 72% predicted (pre), 1.48L, 80% predicted (post), +11.2% improvement FEV1/FVC ratio: 0.60 (pre), 0.68 (post) Interpretation: Spirometry consistent with mild obstructive disease.  With significant response in FEV1 following  bronchodilator use. Suspect in part related to technique.  Please see scanned spirometry results for details.  Labs:  Lab Orders         Allergens Zone 2       Assessment and Plan  Assessment and Plan    Chronic Rhinitis Longstanding symptoms of nasal drainage, both anterior and posterior, with associated facial pressure. History of allergies and current exposure to a cat. Previous trials of Benadryl, Zyrtec, and Flonase with limited success. Recent antibiotic therapy with Augmentin did not improve symptoms. -Order blood work for allergy testing. -Prescribe Atrovent nasal spray to reduce drainage. 1 spray up to three times daily-use less frequently if becomes too dry. -Prescribe Azelastine nasal spray for congestion and sneezing. 2 sprays up to twice daily.  -Recommend continued use of Zyrtec 10 mg daily as needed over Benadryl due to potential long-term memory effects of Benadryl.  Cough New onset of coughing last week, preceded by a scratchy throat. No previous history of asthma. -breathing test showed borderline obstruction and did get better following albuterol use -continue to monitor symptoms, suspect related to drainage and reflux. -trial albuterol 2 puffs every 4 hours as needed for coughing and shortness of breath.  Gastroesophageal Reflux Disease (GERD) Reports of burning sensation in the chest. Currently on Protonix 40mg  twice daily and Pepcid 20mg  once daily. -Continue current medications as prescribed by PCP.  Follow-up in 4-6 weeks to assess response to new treatments and discuss results of allergy testing.       This note in its entirety was forwarded to the Provider who requested this consultation.  Other: none  Thank you for your kind referral. I appreciate the opportunity to take part in Velina's care. Please do not hesitate to contact me with questions.  Sincerely,  Tonny Bollman, MD [facility]

## 2023-08-17 NOTE — Patient Instructions (Addendum)
 Chronic Rhinitis Longstanding symptoms of nasal drainage, both anterior and posterior, with associated facial pressure. History of allergies and current exposure to a cat. Previous trials of Benadryl, Zyrtec, and Flonase with limited success. Recent antibiotic therapy with Augmentin did not improve symptoms. -Order blood work for allergy testing. -use saline nasal spray prior to medicated nasal sprays -Prescribe Atrovent nasal spray to reduce drainage. 1 spray up to three times daily-use less frequently if becomes too dry. -Prescribe Azelastine nasal spray for congestion and sneezing. 2 sprays up to twice daily.  -Recommend continued use of Zyrtec 10 mg daily as needed over Benadryl due to potential long-term memory effects of Benadryl.  Cough New onset of coughing last week, preceded by a scratchy throat. No previous history of asthma. -breathing test showed borderline obstruction and did get better following albuterol use -continue to monitor symptoms, suspect related to drainage and reflux. -trial albuterol 2 puffs every 4 hours as needed for coughing and shortness of breath.  Gastroesophageal Reflux Disease (GERD) Reports of burning sensation in the chest. Currently on Protonix 40mg  twice daily and Pepcid 20mg  once daily. -Continue current medications as prescribed by PCP.  Follow-up in 4-6 weeks to assess response to new treatments and discuss results of allergy testing.  It was a pleasure meeting you in clinic today! Thank you for allowing me to participate in your care.  Tonny Bollman, MD [facility]

## 2023-08-18 ENCOUNTER — Other Ambulatory Visit (HOSPITAL_COMMUNITY): Payer: Self-pay

## 2023-08-19 DIAGNOSIS — M9903 Segmental and somatic dysfunction of lumbar region: Secondary | ICD-10-CM | POA: Diagnosis not present

## 2023-08-19 DIAGNOSIS — M51362 Other intervertebral disc degeneration, lumbar region with discogenic back pain and lower extremity pain: Secondary | ICD-10-CM | POA: Diagnosis not present

## 2023-08-19 DIAGNOSIS — M9904 Segmental and somatic dysfunction of sacral region: Secondary | ICD-10-CM | POA: Diagnosis not present

## 2023-08-19 DIAGNOSIS — M5442 Lumbago with sciatica, left side: Secondary | ICD-10-CM | POA: Diagnosis not present

## 2023-08-21 LAB — IGE+ALLERGENS ZONE 2(30)

## 2023-08-21 NOTE — Progress Notes (Signed)
 Please let Alicia Jordan know that her allergy testing is negative. We can set her up for skin testing for confirmation if she would like.  If negative, this would mean she is no longer allergic and her symptoms are caused by nonallergic rhinitis. The treatment is what we discussed in clinic. ENT evaluation may also be recommended.

## 2023-08-27 ENCOUNTER — Other Ambulatory Visit: Payer: Self-pay | Admitting: Family Medicine

## 2023-08-27 ENCOUNTER — Other Ambulatory Visit (HOSPITAL_COMMUNITY): Payer: Self-pay

## 2023-08-27 ENCOUNTER — Other Ambulatory Visit: Payer: Self-pay

## 2023-08-27 MED ORDER — VITAMIN D (ERGOCALCIFEROL) 1.25 MG (50000 UNIT) PO CAPS
50000.0000 [IU] | ORAL_CAPSULE | ORAL | 1 refills | Status: DC
  Filled 2023-08-27: qty 5, 35d supply, fill #0
  Filled 2023-10-05: qty 5, 35d supply, fill #1

## 2023-09-01 ENCOUNTER — Encounter: Payer: Self-pay | Admitting: Allergy

## 2023-09-01 ENCOUNTER — Ambulatory Visit: Admitting: Allergy

## 2023-09-01 DIAGNOSIS — J302 Other seasonal allergic rhinitis: Secondary | ICD-10-CM

## 2023-09-01 DIAGNOSIS — J3089 Other allergic rhinitis: Secondary | ICD-10-CM | POA: Diagnosis not present

## 2023-09-01 NOTE — Progress Notes (Signed)
 Follow-up Note  RE: Alicia Jordan MRN: [id number] DOB: 02/20/1946 Date of Office Visit: 09/01/2023   History of present illness: Alicia Jordan is a 78 y.o. female presenting today for skin testing visit.  She was last seen by Dr. Maurine Minister on 08/17/2023 for chronic rhinitis.  She presents today with her daughter.  She has not had any antihistamines for the past 2 days for testing today.  She is in her usual health today.  Medication List: Current Outpatient Medications  Medication Sig Dispense Refill   Acetaminophen (TYLENOL PO) Take by mouth as needed.     albuterol (VENTOLIN HFA) 108 (90 Base) MCG/ACT inhaler Inhale 2 puffs into the lungs every 6 (six) hours as needed for wheezing or shortness of breath. 8 g 2   azelastine (ASTELIN) 0.1 % nasal spray Place 2 sprays into both nostrils 2 (two) times daily as needed for rhinitis. Use in each nostril as directed 30 mL 5   cetirizine (ZYRTEC) 10 MG tablet Take 1 tablet (10 mg total) by mouth daily. 90 tablet 3   cholecalciferol (VITAMIN D3) 25 MCG (1000 UNIT) tablet Take 1 tablet (1,000 Units total) by mouth daily. 180 tablet 3   dapagliflozin propanediol (FARXIGA) 10 MG TABS tablet Take 1 tablet (10 mg) by mouth daily before breakfast. 90 tablet 1   ezetimibe (ZETIA) 10 MG tablet Take 1 tablet (10 mg) by mouth daily. 90 tablet 1   famotidine (PEPCID) 20 MG tablet Take 1 tablet (20 mg) by mouth at bedtime. 90 tablet 0   FLUoxetine (PROZAC) 20 MG capsule Take 2 capsules (40 mg) by mouth daily. 180 capsule 1   glucose blood test strip 1 each by Other route 2 (two) times daily. Use as instructed 100 each 2   ipratropium (ATROVENT) 0.03 % nasal spray Place 1 spray into both nostrils 3 (three) times daily as needed for rhinitis (runny nose/drainage. use less if becomes too dry.). 30 mL 12   Iron, Ferrous Sulfate, 325 (65 Fe) MG TABS Take 325 mg by mouth daily. (Patient not taking: Reported on 08/17/2023) 90 tablet 1   losartan (COZAAR) 100 MG  tablet Take 1 tablet (100 mg total) by mouth every evening. 90 tablet 1   montelukast (SINGULAIR) 10 MG tablet Take 1 tablet (10 mg total) by mouth at bedtime. 90 tablet 3   omega-3 acid ethyl esters (LOVAZA) 1 g capsule Take 2 capsules (2 g total) by mouth 2 (two) times daily. 360 capsule 1   ondansetron (ZOFRAN) 4 MG tablet Take 1 tablet (4 mg total) by mouth every 8 (eight) hours as needed for nausea or vomiting. 20 tablet 0   pantoprazole (PROTONIX) 40 MG tablet Take 1 tablet (40 mg total) by mouth 2 (two) times daily. 180 tablet 0   simvastatin (ZOCOR) 40 MG tablet TAKE 1 TABLET BY MOUTH AT BEDTIME 90 tablet 1   sodium chloride (OCEAN) 0.65 % SOLN nasal spray Place 1 spray into both nostrils as needed for congestion. Use prior to medicated nasal sprays 88 mL 3   vitamin B-12 (CYANOCOBALAMIN) 100 MCG tablet Take 100 mcg by mouth daily.     Vitamin D, Ergocalciferol, (DRISDOL) 1.25 MG (50000 UNIT) CAPS capsule Take 1 capsule (50,000 Units total) by mouth every 7 (seven) days on Saturday. 5 capsule 1   No current facility-administered medications for this visit.     Known medication allergies: Allergies  Allergen Reactions   Meloxicam Other (See Comments)   Rosuvastatin  Muscle pain   Semaglutide     Nausea, confusion, and blurred vision   Diagnositics/Labs:  Allergy testing:   Airborne Adult Perc - 09/01/23 0934     Time Antigen Placed 1610    Allergen Manufacturer Waynette Buttery    Location Back    Number of Test 55    1. Control-Buffer 50% Glycerol Negative    2. Control-Histamine 2+    3. Bahia 2+    4. French Southern Territ[place] Negative    5. Johnson Negative    6. Kentucky Blue Negative    7. Meadow Fescue Negative    8. Perennial Rye 2+    9. Timothy 2+    10. Ragweed Mix 2+    11. Cocklebur 2+    12. Plantain,  English Negative    13. Baccharis Negative    14. Dog Fennel Negative    15. Russian Thistle Negative    16. Lamb's Quarters Negative    17. Sheep Sorrell Negative    18.  Rough Pigweed 2+    19. Marsh Elder, Rough Negative    20. Mugwort, Common Negative    21. Box, Elder Negative    22. Cedar, red Negative    23. Sweet Gum Negative    24. Pecan Pollen 2+    25. Pine Mix 2+    26. Walnut, Black Pollen Negative    27. Red Mulberry Negative    28. Ash Mix Negative    29. Birch Mix 2+    30. Beech American Negative    31. Cottonwood, Eastern 3+    32. Hickory, White 2+    33. Maple Mix 2+    34. Oak, Guinea-Bissau Mix Negative    35. Sycamore Eastern Negative    36. Alternaria Alternata Negative    37. Cladosporium Herbarum Negative    38. Aspergillus Mix Negative    39. Penicillium Mix 2+    40. Bipolaris Sorokiniana (Helminthosporium) 2+    41. Drechslera Spicifera (Curvularia) 2+    42. Mucor Plumbeus Negative    43. Fusarium Moniliforme Negative    44. Aureobasidium Pullulans (pullulara) Negative    45. Rhizopus Oryzae Negative    46. Botrytis Cinera 2+    47. Epicoccum Nigrum 2+    48. Phoma Betae 2+    49. Dust Mite Mix Negative    50. Cat Hair 10,000 BAU/ml Negative    51.  Dog Epithelia 2+    52. Mixed Feathers 2+    53. Horse Epithelia 2+    54. Cockroach, German 2+    55. Tobacco Leaf Negative             Allergy testing results were read and interpreted by provider, documented by clinical staff.   Assessment and plan:   Chronic allergic rhinitis Longstanding symptoms of nasal drainage, both anterior and posterior, with associated facial pressure. History of allergies and current exposure to a cat. Previous trials of Benadryl, Zyrtec, and Flonase with limited success. Recent antibiotic therapy with Augmentin did not improve symptoms. -blood work for allergy testing was negative -environmental allergy testing by skin testing today showed: grasses, ragweed, weeds, trees, indoor molds, outdoor molds, dog, cockroach, horse, and mixed feathers -copy of test results provided.  -avoidance measures provided. -use saline nasal spray  prior to medicated nasal sprays -use Atrovent nasal spray to reduce drainage. 1 spray up to three times daily-use less frequently if becomes too dry. -use Azelastine nasal spray for congestion and sneezing. 2 sprays up  to twice daily.  -continue use of Zyrtec 10 mg daily as needed over Benadryl due to potential long-term memory effects of Benadryl.  Cough New onset of coughing, preceded by a scratchy throat. No previous history of asthma. -breathing test showed borderline obstruction and did get better following albuterol use at initial visit -continue to monitor symptoms, suspect related to drainage and reflux. -use albuterol 2 puffs every 4 hours as needed for coughing and shortness of breath.  Gastroesophageal Reflux Disease (GERD) Reports of burning sensation in the chest. Currently on Protonix 40mg  twice daily and Pepcid 20mg  once daily. -continue current medications as prescribed by PCP.  Follow-up in 4-6 months or sooner if needed  I appreciate the opportunity to take part in Aanya's care. Please do not hesitate to contact me with questions.  Sincerely,   Margo Aye, MD Allergy/Immunology [facility]

## 2023-09-01 NOTE — Patient Instructions (Signed)
 Chronic Rhinitis Longstanding symptoms of nasal drainage, both anterior and posterior, with associated facial pressure. History of allergies and current exposure to a cat. Previous trials of Benadryl, Zyrtec, and Flonase with limited success. Recent antibiotic therapy with Augmentin did not improve symptoms. -blood work for allergy testing was negative -environmental allergy testing by skin testing today showed: grasses, ragweed, weeds, trees, indoor molds, outdoor molds, dog, cockroach, horse, and mixed feathers -copy of test results provided.  -avoidance measures provided. -use saline nasal spray prior to medicated nasal sprays -use Atrovent nasal spray to reduce drainage. 1 spray up to three times daily-use less frequently if becomes too dry. -use Azelastine nasal spray for congestion and sneezing. 2 sprays up to twice daily.  -continue use of Zyrtec 10 mg daily as needed over Benadryl due to potential long-term memory effects of Benadryl.  Cough New onset of coughing, preceded by a scratchy throat. No previous history of asthma. -breathing test showed borderline obstruction and did get better following albuterol use at initial visit -continue to monitor symptoms, suspect related to drainage and reflux. -use albuterol 2 puffs every 4 hours as needed for coughing and shortness of breath.  Gastroesophageal Reflux Disease (GERD) Reports of burning sensation in the chest. Currently on Protonix 40mg  twice daily and Pepcid 20mg  once daily. -continue current medications as prescribed by PCP.  Follow-up in 4-6 months or sooner if needed

## 2023-09-02 ENCOUNTER — Other Ambulatory Visit (HOSPITAL_COMMUNITY): Payer: Self-pay

## 2023-09-08 ENCOUNTER — Ambulatory Visit: Payer: PPO | Admitting: Family Medicine

## 2023-09-09 ENCOUNTER — Encounter: Payer: Self-pay | Admitting: Physician Assistant

## 2023-09-09 ENCOUNTER — Ambulatory Visit: Admitting: Physician Assistant

## 2023-09-09 VITALS — BP 128/86 | HR 70 | Temp 97.8°F | Ht 61.42 in | Wt 178.0 lb

## 2023-09-09 DIAGNOSIS — K219 Gastro-esophageal reflux disease without esophagitis: Secondary | ICD-10-CM

## 2023-09-09 DIAGNOSIS — N1832 Chronic kidney disease, stage 3b: Secondary | ICD-10-CM

## 2023-09-09 DIAGNOSIS — F330 Major depressive disorder, recurrent, mild: Secondary | ICD-10-CM

## 2023-09-09 DIAGNOSIS — E782 Mixed hyperlipidemia: Secondary | ICD-10-CM

## 2023-09-09 DIAGNOSIS — E538 Deficiency of other specified B group vitamins: Secondary | ICD-10-CM | POA: Diagnosis not present

## 2023-09-09 DIAGNOSIS — I129 Hypertensive chronic kidney disease with stage 1 through stage 4 chronic kidney disease, or unspecified chronic kidney disease: Secondary | ICD-10-CM

## 2023-09-09 DIAGNOSIS — E559 Vitamin D deficiency, unspecified: Secondary | ICD-10-CM

## 2023-09-09 DIAGNOSIS — J301 Allergic rhinitis due to pollen: Secondary | ICD-10-CM | POA: Diagnosis not present

## 2023-09-09 NOTE — Progress Notes (Signed)
 Subjective:  Patient ID: Alicia Jordan, female    DOB: 07-Aug-1945  Age: 79 y.o. MRN: [id number]  Chief Complaint  Patient presents with   Medical Management of Chronic Issues    History of Present Illness           .     History of Present Illness Patient complains of chronic allergies and headaches - since her last visit with Dr Sedalia Muta she did go see allergist Dr Lucie Leather and have allergy testing - multiple allergies noted.  At this point pt defers allergy shots and will be treated with medications.  She has been advised to use nasal saline spray, atrovent nasal spray, azelastine nasal spray, singulair, zyrtec, Protonix 40mg  bid, pepcid 20mg  qd and albuterol as needed   The patient with NIDDM -  she monitors daily. Her blood sugar levels range from 140 to 150 fasting  She is currently taking [place]  for diabetes management. She  is up to date on eye exam.    Pt with hyperlipidemia - last labwork 3 months ago stable - currently on zetia and simvastatin 40mg   Pt presents for follow up of hypertension. The patient is tolerating the medication well without side effects. Compliance with treatment has been good; including taking medication as directed , maintains a healthy diet and regular exercise regimen , and following up as directed. Currently on losartan 100mg  qd  Pt with Vit D and vit b12 deficiencies - takes supplements for both  Pt with mild depression - stable on prozac 20mg  qd       09/09/2023    1:46 PM 05/28/2023   10:09 AM 02/19/2023    9:34 AM 11/05/2022    8:54 AM 07/30/2022    8:42 AM  Depression screen PHQ 2/9  Decreased Interest 0 0 0 0 0  Down, Depressed, Hopeless 0 0 0 0 0  PHQ - 2 Score 0 0 0 0 0  Altered sleeping  0 1  0  Tired, decreased energy  0 1  1  Change in appetite  0 0  0  Feeling bad or failure about yourself   0 0  0  Trouble concentrating  0 0  0  Moving slowly or fidgety/restless  0 0  0  Suicidal thoughts  0 0  0  PHQ-9 Score  0 2  1   Difficult doing work/chores  Not difficult at all Not difficult at all  Not difficult at all        09/09/2023    1:46 PM  Fall Risk   Falls in the past year? 0  Number falls in past yr: 0  Injury with Fall? 0  Risk for fall due to : No Fall Risks    Patient Care Team: Blane Ohara, MD as PCP - General (Family Medicine) Lynann Bologna, MD as Consulting Physician (Gastroenterology) Linda Hedges, MD as Referring Physician (Orthopedic Surgery) Dannielle Huh, MD as Consulting Physician (Orthopedic Surgery)   CONSTITUTIONAL: Negative for chills, fatigue, fever, unintentional weight gain and unintentional weight loss.  E/N/T: see HPI CARDIOVASCULAR: Negative for chest pain, dizziness, palpitations and pedal edema.  RESPIRATORY: Negative for recent cough and dyspnea.  GASTROINTESTINAL: Negative for abdominal pain, acid reflux symptoms, constipation, diarrhea, nausea and vomiting.  MSK: Negative for arthralgias and myalgias.  INTEGUMENTARY: Negative for rash.  NEUROLOGICAL: Negative for dizziness and headaches.  PSYCHIATRIC: Negative for sleep disturbance and to question depression screen.  Negative for depression, negative for anhedonia.  Current Outpatient Medications on File Prior to Visit  Medication Sig Dispense Refill   Acetaminophen (TYLENOL PO) Take by mouth as needed.     albuterol (VENTOLIN HFA) 108 (90 Base) MCG/ACT inhaler Inhale 2 puffs into the lungs every 6 (six) hours as needed for wheezing or shortness of breath. 8 g 2   azelastine (ASTELIN) 0.1 % nasal spray Place 2 sprays into both nostrils 2 (two) times daily as needed for rhinitis. Use in each nostril as directed 30 mL 5   cetirizine (ZYRTEC) 10 MG tablet Take 1 tablet (10 mg total) by mouth daily. 90 tablet 3   cholecalciferol (VITAMIN D3) 25 MCG (1000 UNIT) tablet Take 1 tablet (1,000 Units total) by mouth daily. 180 tablet 3   dapagliflozin propanediol (FARXIGA) 10 MG TABS tablet Take 1 tablet (10 mg) by  mouth daily before breakfast. 90 tablet 1   ezetimibe (ZETIA) 10 MG tablet Take 1 tablet (10 mg) by mouth daily. 90 tablet 1   famotidine (PEPCID) 20 MG tablet Take 1 tablet (20 mg) by mouth at bedtime. 90 tablet 0   FLUoxetine (PROZAC) 20 MG capsule Take 2 capsules (40 mg) by mouth daily. 180 capsule 1   glucose blood test strip 1 each by Other route 2 (two) times daily. Use as instructed 100 each 2   ipratropium (ATROVENT) 0.03 % nasal spray Place 1 spray into both nostrils 3 (three) times daily as needed for rhinitis (runny nose/drainage. use less if becomes too dry.). 30 mL 12   losartan (COZAAR) 100 MG tablet Take 1 tablet (100 mg total) by mouth every evening. 90 tablet 1   montelukast (SINGULAIR) 10 MG tablet Take 1 tablet (10 mg total) by mouth at bedtime. 90 tablet 3   omega-3 acid ethyl esters (LOVAZA) 1 g capsule Take 2 capsules (2 g total) by mouth 2 (two) times daily. 360 capsule 1   ondansetron (ZOFRAN) 4 MG tablet Take 1 tablet (4 mg total) by mouth every 8 (eight) hours as needed for nausea or vomiting. 20 tablet 0   pantoprazole (PROTONIX) 40 MG tablet Take 1 tablet (40 mg total) by mouth 2 (two) times daily. 180 tablet 0   simvastatin (ZOCOR) 40 MG tablet TAKE 1 TABLET BY MOUTH AT BEDTIME 90 tablet 1   sodium chloride (OCEAN) 0.65 % SOLN nasal spray Place 1 spray into both nostrils as needed for congestion. Use prior to medicated nasal sprays 88 mL 3   vitamin B-12 (CYANOCOBALAMIN) 100 MCG tablet Take 100 mcg by mouth daily.     Vitamin D, Ergocalciferol, (DRISDOL) 1.25 MG (50000 UNIT) CAPS capsule Take 1 capsule (50,000 Units total) by mouth every 7 (seven) days on Saturday. 5 capsule 1   [DISCONTINUED] sitaGLIPtin (JANUVIA) 50 MG tablet Take 1 tablet by mouth once daily 90 tablet 0   No current facility-administered medications on file prior to visit.   Past Medical History:  Diagnosis Date   Allergy    Anemia    Anxiety    Arthralgia of left temporomandibular joint     Arthritis    Cervicalgia    Chronic kidney disease, stage 3a (HCC)    Depression    Diabetes mellitus without complication (HCC)    Hesitancy of micturition    Hyperlipidemia    Hypertension    Osteoporosis    Other psoriasis    Other specified menopausal and perimenopausal disorders    Overweight    Repeated falls    Residual hemorrhoidal skin tags  Past Surgical History:  Procedure Laterality Date   ABDOMINAL HYSTERECTOMY     APPENDECTOMY     CARPAL TUNNEL RELEASE Right    CATARACT EXTRACTION, BILATERAL Bilateral    [place] IOL implant.  Right SN [id number]     Left SN [id number]   CHOLECYSTECTOMY     COLONOSCOPY  04/09/2016   Mild sigmoid diverticulosis. Otherwise normal colonoscopy   ESOPHAGOGASTRODUODENOSCOPY  09/10/2012   No obvious esophageal stricture, status post empiric esophageal dilation. Irregular GE junction, questionable significance. Mild gastritis.   eye lid surgery     KNEE SURGERY Left    REPLACEMENT TOTAL KNEE Right 06/03/2021    Family History  Problem Relation Age of Onset   Diabetes type II Mother    Hyperlipidemia Mother    Hypertension Mother    Dementia Mother    Heart attack Father    Hyperlipidemia Father    Hypertension Father    Stroke Father    Diabetes type II Father    Colon cancer Daughter 4   Cancer Daughter    Esophageal cancer Neg Hx    Rectal cancer Neg Hx    Stomach cancer Neg Hx    Social History   Socioeconomic History   Marital status: Married    Spouse name: Jake Shark   Number of children: 4   Years of education: Not on file   Highest education level: Not on file  Occupational History   Occupation: Homemaker  Tobacco Use   Smoking status: Never    Passive exposure: Never   Smokeless tobacco: Never  Vaping Use   Vaping status: Never Used  Substance and Sexual Activity   Alcohol use: Never   Drug use: Never   Sexual activity: Not on file  Other Topics Concern   Not on file  Social History Narrative    Tymber spends a lot if time helping her daughter get to and from oncology appointments   Her husband has started drinking alcohol again   Social Drivers of Health   Financial Resource Strain: Medium Risk (02/19/2023)   Overall Financial Resource Strain (CARDIA)    Difficulty of Paying Living Expenses: Somewhat hard  Food Insecurity: No Food Insecurity (02/19/2023)   Hunger Vital Sign    Worried About Running Out of Food in the Last Year: Never true    Ran Out of Food in the Last Year: Never true  Transportation Needs: No Transportation Needs (02/19/2023)   PRAPARE - Administrator, Civil Service (Medical): No    Lack of Transportation (Non-Medical): No  Physical Activity: Sufficiently Active (02/19/2023)   Exercise Vital Sign    Days of Exercise per Week: 5 days    Minutes of Exercise per Session: 30 min  Stress: No Stress Concern Present (02/19/2023)   [employer] of Occupational Health - Occupational Stress Questionnaire    Feeling of Stress : Only a little  Social Connections: Socially Integrated (02/19/2023)   Social Connection and Isolation Panel [NHANES]    Frequency of Communication with Friends and Family: More than three times a week    Frequency of Social Gatherings with Friends and Family: More than three times a week    Attends Religious Services: More than 4 times per year    Active Member of [employer] or Organizations: Yes    Attends Engineer, structural: More than 4 times per year    Marital Status: Married    Objective:  PHYSICAL EXAM:   VS: BP  128/86   Pulse 70   Temp 97.8 F (36.6 C)   Ht 5' 1.42" (1.56 m)   Wt 178 lb (80.7 kg)   SpO2 98%   BMI 33.17 kg/m   GEN: Well nourished, well developed, in no acute distress  HEENT: normal external ears and nose - normal external auditory canals and TMS - hearing grossly normal - m - Lips, Teeth and Gums - normal  Oropharynx - normal mucosa, palate, and posterior pharynx  Cardiac: RRR; no murmurs,  rubs, or gallops,no edema -  Respiratory:  normal respiratory rate and pattern with no distress - normal breath sounds with no rales, rhonchi, wheezes or rubs  MS: no deformity or atrophy  Skin: warm and dry, no rash  Neuro:  Alert and Oriented x 3, - CN II-Xii grossly intact Psych: euthymic mood, appropriate affect and demeanor   Diabetic Foot Exam - Simple   No data filed      Lab Results  Component Value Date   WBC 5.9 05/28/2023   HGB 13.8 05/28/2023   HCT 43.4 05/28/2023   PLT 159 05/28/2023   GLUCOSE 118 (H) 05/28/2023   CHOL 128 02/19/2023   TRIG 92 02/19/2023   HDL 50 02/19/2023   LDLCALC 61 02/19/2023   ALT 14 05/28/2023   AST 20 05/28/2023   NA 142 05/28/2023   K 4.7 05/28/2023   CL 103 05/28/2023   CREATININE 1.49 (H) 05/28/2023   BUN 26 05/28/2023   CO2 24 05/28/2023   TSH 3.260 05/28/2023   INR 1.0 12/14/2020   HGBA1C 6.8 (H) 05/28/2023   MICROALBUR 30 03/29/2021      Assessment & Plan:    Mixed hyperlipidemia Continue meds Labwork at next visit Mild recurrent major depression (HCC) Continue med GERD without esophagitis Continue meds Non-seasonal allergic rhinitis due to pollen Continue meds Follow up with allergist as directed B12 deficiency Continue supplement Vitamin D deficiency Continue supplement Hypertensive kidney disease with stage 3b chronic kidney disease (HCC) Continue current meds     No orders of the defined types were placed in this encounter.   No orders of the defined types were placed in this encounter.    Follow-up: Return in about 3 months (around 12/10/2023) for chronic fasting follow-up with Dr Sedalia Muta.     An After Visit Summary was printed and given to the patient.     Jettie Pagan [facility] [phone]

## 2023-09-24 ENCOUNTER — Other Ambulatory Visit (HOSPITAL_COMMUNITY): Payer: Self-pay

## 2023-10-01 ENCOUNTER — Ambulatory Visit: Admitting: Allergy and Immunology

## 2023-10-05 ENCOUNTER — Other Ambulatory Visit (HOSPITAL_COMMUNITY): Payer: Self-pay

## 2023-10-10 ENCOUNTER — Other Ambulatory Visit: Payer: Self-pay | Admitting: Family Medicine

## 2023-10-10 DIAGNOSIS — F330 Major depressive disorder, recurrent, mild: Secondary | ICD-10-CM

## 2023-10-10 MED ORDER — LOSARTAN POTASSIUM 100 MG PO TABS
100.0000 mg | ORAL_TABLET | Freq: Every evening | ORAL | 1 refills | Status: DC
  Filled 2023-10-10: qty 90, 90d supply, fill #0
  Filled 2024-01-11: qty 90, 90d supply, fill #1

## 2023-10-10 MED ORDER — FLUOXETINE HCL 20 MG PO CAPS
40.0000 mg | ORAL_CAPSULE | Freq: Every day | ORAL | 1 refills | Status: DC
  Filled 2023-10-10: qty 180, 90d supply, fill #0
  Filled 2024-01-11: qty 180, 90d supply, fill #1

## 2023-10-12 ENCOUNTER — Other Ambulatory Visit (HOSPITAL_COMMUNITY): Payer: Self-pay

## 2023-10-14 ENCOUNTER — Other Ambulatory Visit (HOSPITAL_COMMUNITY): Payer: Self-pay

## 2023-10-16 ENCOUNTER — Other Ambulatory Visit: Payer: Self-pay | Admitting: Family Medicine

## 2023-10-16 ENCOUNTER — Other Ambulatory Visit (HOSPITAL_COMMUNITY): Payer: Self-pay

## 2023-10-16 DIAGNOSIS — E782 Mixed hyperlipidemia: Secondary | ICD-10-CM

## 2023-10-18 MED ORDER — OMEGA-3-ACID ETHYL ESTERS 1 G PO CAPS
2.0000 g | ORAL_CAPSULE | Freq: Two times a day (BID) | ORAL | 1 refills | Status: DC
Start: 2023-10-18 — End: 2024-04-20
  Filled 2023-10-18 – 2023-10-21 (×2): qty 360, 90d supply, fill #0
  Filled 2024-01-16: qty 360, 90d supply, fill #1

## 2023-10-19 ENCOUNTER — Other Ambulatory Visit (HOSPITAL_COMMUNITY): Payer: Self-pay

## 2023-10-21 ENCOUNTER — Other Ambulatory Visit (HOSPITAL_COMMUNITY): Payer: Self-pay

## 2023-10-21 ENCOUNTER — Other Ambulatory Visit: Payer: Self-pay

## 2023-10-21 ENCOUNTER — Other Ambulatory Visit: Payer: Self-pay | Admitting: Family Medicine

## 2023-10-21 DIAGNOSIS — M9903 Segmental and somatic dysfunction of lumbar region: Secondary | ICD-10-CM | POA: Diagnosis not present

## 2023-10-21 DIAGNOSIS — M5442 Lumbago with sciatica, left side: Secondary | ICD-10-CM | POA: Diagnosis not present

## 2023-10-21 DIAGNOSIS — K219 Gastro-esophageal reflux disease without esophagitis: Secondary | ICD-10-CM

## 2023-10-21 DIAGNOSIS — M9904 Segmental and somatic dysfunction of sacral region: Secondary | ICD-10-CM | POA: Diagnosis not present

## 2023-10-21 DIAGNOSIS — M51362 Other intervertebral disc degeneration, lumbar region with discogenic back pain and lower extremity pain: Secondary | ICD-10-CM | POA: Diagnosis not present

## 2023-10-21 MED ORDER — PANTOPRAZOLE SODIUM 40 MG PO TBEC
40.0000 mg | DELAYED_RELEASE_TABLET | Freq: Two times a day (BID) | ORAL | 0 refills | Status: DC
  Filled 2023-10-21: qty 180, 90d supply, fill #0

## 2023-10-22 ENCOUNTER — Other Ambulatory Visit (HOSPITAL_COMMUNITY): Payer: Self-pay

## 2023-10-22 ENCOUNTER — Other Ambulatory Visit: Payer: Self-pay

## 2023-10-29 ENCOUNTER — Other Ambulatory Visit (HOSPITAL_COMMUNITY): Payer: Self-pay

## 2023-11-02 DIAGNOSIS — Z961 Presence of intraocular lens: Secondary | ICD-10-CM | POA: Diagnosis not present

## 2023-11-03 DIAGNOSIS — M5442 Lumbago with sciatica, left side: Secondary | ICD-10-CM | POA: Diagnosis not present

## 2023-11-03 DIAGNOSIS — M51362 Other intervertebral disc degeneration, lumbar region with discogenic back pain and lower extremity pain: Secondary | ICD-10-CM | POA: Diagnosis not present

## 2023-11-03 DIAGNOSIS — M9904 Segmental and somatic dysfunction of sacral region: Secondary | ICD-10-CM | POA: Diagnosis not present

## 2023-11-03 DIAGNOSIS — M9903 Segmental and somatic dysfunction of lumbar region: Secondary | ICD-10-CM | POA: Diagnosis not present

## 2023-11-05 ENCOUNTER — Other Ambulatory Visit (HOSPITAL_COMMUNITY): Payer: Self-pay

## 2023-11-05 ENCOUNTER — Other Ambulatory Visit: Payer: Self-pay

## 2023-11-05 ENCOUNTER — Other Ambulatory Visit: Payer: Self-pay | Admitting: Family Medicine

## 2023-11-05 MED ORDER — VITAMIN D (ERGOCALCIFEROL) 1.25 MG (50000 UNIT) PO CAPS
50000.0000 [IU] | ORAL_CAPSULE | ORAL | 1 refills | Status: DC
  Filled 2023-11-05: qty 10, 70d supply, fill #0

## 2023-11-18 DIAGNOSIS — M9903 Segmental and somatic dysfunction of lumbar region: Secondary | ICD-10-CM | POA: Diagnosis not present

## 2023-11-18 DIAGNOSIS — M51362 Other intervertebral disc degeneration, lumbar region with discogenic back pain and lower extremity pain: Secondary | ICD-10-CM | POA: Diagnosis not present

## 2023-11-18 DIAGNOSIS — M5442 Lumbago with sciatica, left side: Secondary | ICD-10-CM | POA: Diagnosis not present

## 2023-11-18 DIAGNOSIS — M9904 Segmental and somatic dysfunction of sacral region: Secondary | ICD-10-CM | POA: Diagnosis not present

## 2023-11-23 ENCOUNTER — Ambulatory Visit: Payer: Self-pay

## 2023-11-23 ENCOUNTER — Ambulatory Visit (INDEPENDENT_AMBULATORY_CARE_PROVIDER_SITE_OTHER): Admitting: Family Medicine

## 2023-11-23 ENCOUNTER — Encounter: Payer: Self-pay | Admitting: Family Medicine

## 2023-11-23 VITALS — BP 116/58 | HR 77 | Temp 98.1°F | Ht 61.4 in | Wt 150.0 lb

## 2023-11-23 DIAGNOSIS — R197 Diarrhea, unspecified: Secondary | ICD-10-CM

## 2023-11-23 DIAGNOSIS — K921 Melena: Secondary | ICD-10-CM | POA: Diagnosis not present

## 2023-11-23 NOTE — Telephone Encounter (Signed)
 FYI Only or Action Required?: FYI only for provider  Patient was last seen in primary care on 09/09/2023 by Cyndi Drain, PA-C. Called Nurse Triage reporting Diarrhea. Symptoms began 2 weeks. Interventions attempted: OTC medications: pepto-bismol. Symptoms are: unchanged.  Triage Disposition: See Physician Within 24 Hours  Patient/caregiver understands and will follow disposition?: Yes   Copied from CRM [id number]. Topic: Clinical - Red Word Triage >> Nov 23, 2023  7:46 AM Alicia Jordan wrote: Red Word that prompted transfer to Nurse Triage: patient has had diarrhea for 2 weeks, this morning it was black, patient is very weak Reason for Disposition  [1] SEVERE diarrhea (e.g., 7 or more times / day more than normal) AND [2] present > 24 hours (1 day)  Answer Assessment - Initial Assessment Questions 1. DIARRHEA SEVERITY: "How bad is the diarrhea?" "How many more stools have you had in the past 24 hours than normal?"    - NO DIARRHEA (SCALE 0)   - MILD (SCALE 1-3): Few loose or mushy BMs; increase of 1-3 stools over normal daily number of stools; mild increase in ostomy output.   -  MODERATE (SCALE 4-7): Increase of 4-6 stools daily over normal; moderate increase in ostomy output.   -  SEVERE (SCALE 8-10; OR "WORST POSSIBLE"): Increase of 7 or more stools daily over normal; moderate increase in ostomy output; incontinence.     moderate 2. ONSET: "When did the diarrhea begin?"      2 weeks 3. BM CONSISTENCY: "How loose or watery is the diarrhea?"      Watery, loose stools 4. VOMITING: "Are you also vomiting?" If Yes, ask: "How many times in the past 24 hours?"      no 5. ABDOMEN PAIN: "Are you having any abdomen pain?" If Yes, ask: "What does it feel like?" (e.g., crampy, dull, intermittent, constant)      Jordan and goes 6. ABDOMEN PAIN SEVERITY: If present, ask: "How bad is the pain?"  (e.g., Scale 1-10; mild, moderate, or severe)   - MILD (1-3): doesn't interfere with normal activities, abdomen  soft and not tender to touch    - MODERATE (4-7): interferes with normal activities or awakens from sleep, abdomen tender to touch    - SEVERE (8-10): excruciating pain, doubled over, unable to do any normal activities       4 or 5/10 on left abd 7. ORAL INTAKE: If vomiting, "Have you been able to drink liquids?" "How much liquids have you had in the past 24 hours?"     Has been drinking fluids 8. HYDRATION: "Any signs of dehydration?" (e.g., dry mouth [not just dry lips], too weak to stand, dizziness, new weight loss) "When did you last urinate?"     weak 9. EXPOSURE: "Have you traveled to a foreign country recently?" "Have you been exposed to anyone with diarrhea?" "Could you have eaten any food that was spoiled?"     N/a 10. ANTIBIOTIC USE: "Are you taking antibiotics now or have you taken antibiotics in the past 2 months?"       no 11. OTHER SYMPTOMS: "Do you have any other symptoms?" (e.g., fever, blood in stool)       Nausea, black stool x 2, diarrhea has strong smell 12. PREGNANCY: "Is there any chance you are pregnant?" "When was your last menstrual period?"       N/a+  Protocols used: [facility]

## 2023-11-23 NOTE — Progress Notes (Signed)
 Acute Office Visit  Subjective:    Patient ID: Alicia Jordan, female    DOB: 01-11-1946, 78 y.o.   MRN: [id number]  Chief Complaint  Patient presents with   Medical Management of Chronic Issues    HPI: Patient is in today for diarrhea x 2 weeks, twice has had 2 black stools, occasional left sided abd pain. Feels fatigue. Denies nausea. Going multiple times through our the day, stools are watery. 3-6 times a day. Has tried pepto, however the 2 times she had dark stools, she had not taken pepto. No one else having symptoms. No international travel.   Past Medical History:  Diagnosis Date   Allergy     Anemia    Anxiety    Arthralgia of left temporomandibular joint    Arthritis    Cervicalgia    Chronic kidney disease, stage 3a (HCC)    Depression    Diabetes mellitus without complication (HCC)    Hesitancy of micturition    Hyperlipidemia    Hypertension    Osteoporosis    Other psoriasis    Other specified menopausal and perimenopausal disorders    Overweight    Repeated falls    Residual hemorrhoidal skin tags     Past Surgical History:  Procedure Laterality Date   ABDOMINAL HYSTERECTOMY     APPENDECTOMY     CARPAL TUNNEL RELEASE Right    CATARACT EXTRACTION, BILATERAL Bilateral    [place] IOL implant.  Right SN [id number]     Left SN [id number]   CHOLECYSTECTOMY     COLONOSCOPY  04/09/2016   Mild sigmoid diverticulosis. Otherwise normal colonoscopy   ESOPHAGOGASTRODUODENOSCOPY  09/10/2012   No obvious esophageal stricture, status post empiric esophageal dilation. Irregular GE junction, questionable significance. Mild gastritis.   eye lid surgery     KNEE SURGERY Left    REPLACEMENT TOTAL KNEE Right 06/03/2021    Family History  Problem Relation Age of Onset   Diabetes type II Mother    Hyperlipidemia Mother    Hypertension Mother    Dementia Mother    Heart attack Father    Hyperlipidemia Father    Hypertension Father    Stroke Father     Diabetes type II Father    Colon cancer Daughter 37   Cancer Daughter    Esophageal cancer Neg Hx    Rectal cancer Neg Hx    Stomach cancer Neg Hx     Social History   Socioeconomic History   Marital status: Married    Spouse name: Adaline Holly   Number of children: 4   Years of education: Not on file   Highest education level: Not on file  Occupational History   Occupation: Homemaker  Tobacco Use   Smoking status: Never    Passive exposure: Never   Smokeless tobacco: Never  Vaping Use   Vaping status: Never Used  Substance and Sexual Activity   Alcohol use: Never   Drug use: Never   Sexual activity: Not on file  Other Topics Concern   Not on file  Social History Narrative   Iracema spends a lot if time helping her daughter get to and from oncology appointments   Her husband has started drinking alcohol again   Social Drivers of Health   Financial Resource Strain: Medium Risk (02/19/2023)   Overall Financial Resource Strain (CARDIA)    Difficulty of Paying Living Expenses: Somewhat hard  Food Insecurity: No Food Insecurity (02/19/2023)   Hunger Vital Sign  Worried About Programme researcher, broadcasting/film/video in the Last Year: Never true    Ran Out of Food in the Last Year: Never true  Transportation Needs: No Transportation Needs (02/19/2023)   PRAPARE - Administrator, Civil Service (Medical): No    Lack of Transportation (Non-Medical): No  Physical Activity: Sufficiently Active (02/19/2023)   Exercise Vital Sign    Days of Exercise per Week: 5 days    Minutes of Exercise per Session: 30 min  Stress: No Stress Concern Present (02/19/2023)   [employer] of Occupational Health - Occupational Stress Questionnaire    Feeling of Stress : Only a little  Social Connections: Socially Integrated (02/19/2023)   Social Connection and Isolation Panel    Frequency of Communication with Friends and Family: More than three times a week    Frequency of Social Gatherings with Friends and Family:  More than three times a week    Attends Religious Services: More than 4 times per year    Active Member of [employer] or Organizations: Yes    Attends Engineer, structural: More than 4 times per year    Marital Status: Married  Catering manager Violence: Not At Risk (02/19/2023)   Humiliation, Afraid, Rape, and Kick questionnaire    Fear of Current or Ex-Partner: No    Emotionally Abused: No    Physically Abused: No    Sexually Abused: No    Outpatient Medications Prior to Visit  Medication Sig Dispense Refill   Acetaminophen (TYLENOL PO) Take by mouth as needed.     albuterol  (VENTOLIN  HFA) 108 (90 Base) MCG/ACT inhaler Inhale 2 puffs into the lungs every 6 (six) hours as needed for wheezing or shortness of breath. 8 g 2   azelastine  (ASTELIN ) 0.1 % nasal spray Place 2 sprays into both nostrils 2 (two) times daily as needed for rhinitis. Use in each nostril as directed 30 mL 5   cetirizine  (ZYRTEC ) 10 MG tablet Take 1 tablet (10 mg total) by mouth daily. 90 tablet 3   cholecalciferol  (VITAMIN D3) 25 MCG (1000 UNIT) tablet Take 1 tablet (1,000 Units total) by mouth daily. 180 tablet 3   dapagliflozin  propanediol (FARXIGA ) 10 MG TABS tablet Take 1 tablet (10 mg) by mouth daily before breakfast. 90 tablet 1   ezetimibe  (ZETIA ) 10 MG tablet Take 1 tablet (10 mg) by mouth daily. 90 tablet 1   famotidine  (PEPCID ) 20 MG tablet Take 1 tablet (20 mg) by mouth at bedtime. 90 tablet 0   FLUoxetine  (PROZAC ) 20 MG capsule Take 2 capsules (40 mg) by mouth daily. 180 capsule 1   glucose blood test strip 1 each by Other route 2 (two) times daily. Use as instructed 100 each 2   ipratropium (ATROVENT ) 0.03 % nasal spray Place 1 spray into both nostrils 3 (three) times daily as needed for rhinitis (runny nose/drainage. use less if becomes too dry.). 30 mL 12   losartan  (COZAAR ) 100 MG tablet Take 1 tablet (100 mg total) by mouth every evening. 90 tablet 1   montelukast  (SINGULAIR ) 10 MG tablet Take 1  tablet (10 mg total) by mouth at bedtime. 90 tablet 3   omega-3 acid ethyl esters (LOVAZA ) 1 g capsule Take 2 capsules (2 g total) by mouth 2 (two) times daily. 360 capsule 1   ondansetron  (ZOFRAN ) 4 MG tablet Take 1 tablet (4 mg total) by mouth every 8 (eight) hours as needed for nausea or vomiting. 20 tablet 0   pantoprazole  (  PROTONIX ) 40 MG tablet Take 1 tablet (40 mg total) by mouth 2 (two) times daily. 180 tablet 0   simvastatin  (ZOCOR ) 40 MG tablet TAKE 1 TABLET BY MOUTH AT BEDTIME 90 tablet 1   sodium chloride  (OCEAN) 0.65 % SOLN nasal spray Place 1 spray into both nostrils as needed for congestion. Use prior to medicated nasal sprays 88 mL 3   vitamin B-12 (CYANOCOBALAMIN ) 100 MCG tablet Take 100 mcg by mouth daily.     Vitamin D , Ergocalciferol , (DRISDOL ) 1.25 MG (50000 UNIT) CAPS capsule Take 1 capsule (50,000 Units total) by mouth every 7 (seven) days on Saturday. 5 capsule 1   No facility-administered medications prior to visit.    Allergies  Allergen Reactions   Meloxicam Other (See Comments)   Rosuvastatin      Muscle pain   Semaglutide      Nausea, confusion, and blurred vision    Review of Systems  Constitutional:  Negative for chills, fatigue and fever.  HENT:  Negative for congestion, ear pain, rhinorrhea and sore throat.   Respiratory:  Negative for cough and shortness of breath.   Cardiovascular:  Negative for chest pain.  Gastrointestinal:  Positive for abdominal pain and diarrhea. Negative for constipation, nausea and vomiting.  Genitourinary:  Negative for dysuria and urgency.  Musculoskeletal:  Negative for back pain and myalgias.  Neurological:  Negative for dizziness, weakness, light-headedness and headaches.  Psychiatric/Behavioral:  Negative for dysphoric mood. The patient is not nervous/anxious.        Objective:         11/23/2023    3:00 PM 09/09/2023    1:43 PM 08/17/2023    2:08 PM  Vitals with BMI  Height 5' 1.4 5' 1.42 5' 1.417  Weight 150  lbs 178 lbs 147 lbs 6 oz  BMI 27.96 33.18 27.47  Systolic 116 128 147  Diastolic 58 86 64  Pulse 77 70 76    No data found.   Physical Exam Vitals reviewed.  Constitutional:      Appearance: Normal appearance. She is normal weight.   Cardiovascular:     Rate and Rhythm: Normal rate and regular rhythm.     Heart sounds: Normal heart sounds.  Pulmonary:     Effort: Pulmonary effort is normal. No respiratory distress.     Breath sounds: Normal breath sounds.  Abdominal:     General: Abdomen is flat. Bowel sounds are normal.     Palpations: Abdomen is soft.     Tenderness: There is abdominal tenderness (very mild).   Neurological:     Mental Status: She is alert and oriented to person, place, and time.   Psychiatric:        Mood and Affect: Mood normal.        Behavior: Behavior normal.     Health Maintenance Due  Topic Date Due   COVID-19 Vaccine (7 - 2024-25 season) 10/23/2023   FOOT EXAM  11/05/2023   HEMOGLOBIN A1C  11/26/2023    There are no preventive care reminders to display for this patient.   Lab Results  Component Value Date   TSH 3.260 05/28/2023   Lab Results  Component Value Date   WBC 5.8 11/23/2023   HGB 13.1 11/23/2023   HCT 41.1 11/23/2023   MCV 93 11/23/2023   PLT 195 11/23/2023   Lab Results  Component Value Date   NA 144 11/23/2023   K 4.9 11/23/2023   CO2 20 11/23/2023   GLUCOSE 161 (H) 11/23/2023  BUN 32 (H) 11/23/2023   CREATININE 1.66 (H) 11/23/2023   BILITOT 0.6 11/23/2023   ALKPHOS 77 11/23/2023   AST 17 11/23/2023   ALT 13 11/23/2023   PROT 6.9 11/23/2023   ALBUMIN 4.5 11/23/2023   CALCIUM  9.5 11/23/2023   EGFR 32 (L) 11/23/2023   Lab Results  Component Value Date   CHOL 128 02/19/2023   Lab Results  Component Value Date   HDL 50 02/19/2023   Lab Results  Component Value Date   LDLCALC 61 02/19/2023   Lab Results  Component Value Date   TRIG 92 02/19/2023   Lab Results  Component Value Date   CHOLHDL  2.6 02/19/2023   Lab Results  Component Value Date   HGBA1C 6.8 (H) 05/28/2023       Assessment & Plan:  Diarrhea of presumed infectious origin Assessment & Plan: Recommend hydrate.  Check labs.  Check stool studies.   Orders: -     CBC with Differential/Platelet -     Comprehensive metabolic panel with GFR -     GI Profile, Stool, PCR  Melena Assessment & Plan: Recommend hydrate.  Check labs.  Check stool studies.   Orders: -     CBC with Differential/Platelet -     Comprehensive metabolic panel with GFR -     GI Profile, Stool, PCR     No orders of the defined types were placed in this encounter.   Orders Placed This Encounter  Procedures   CBC with Differential/Platelet   Comprehensive metabolic panel with GFR   GI Profile, Stool, PCR     Follow-up: No follow-ups on file.  An After Visit Summary was printed and given to the patient.  Mercy Stall, MD [facility] [phone]

## 2023-11-24 ENCOUNTER — Ambulatory Visit: Payer: Self-pay | Admitting: Family Medicine

## 2023-11-24 DIAGNOSIS — R197 Diarrhea, unspecified: Secondary | ICD-10-CM | POA: Diagnosis not present

## 2023-11-24 LAB — CBC WITH DIFFERENTIAL/PLATELET
Basophils Absolute: 0 10*3/uL (ref 0.0–0.2)
Basos: 1 %
EOS (ABSOLUTE): 0.1 10*3/uL (ref 0.0–0.4)
Eos: 1 %
Hematocrit: 41.1 % (ref 34.0–46.6)
Hemoglobin: 13.1 g/dL (ref 11.1–15.9)
Immature Grans (Abs): 0 10*3/uL (ref 0.0–0.1)
Immature Granulocytes: 0 %
Lymphocytes Absolute: 1.6 10*3/uL (ref 0.7–3.1)
Lymphs: 27 %
MCH: 29.8 pg (ref 26.6–33.0)
MCHC: 31.9 g/dL (ref 31.5–35.7)
MCV: 93 fL (ref 79–97)
Monocytes Absolute: 0.4 10*3/uL (ref 0.1–0.9)
Monocytes: 7 %
Neutrophils Absolute: 3.7 10*3/uL (ref 1.4–7.0)
Neutrophils: 64 %
Platelets: 195 10*3/uL (ref 150–450)
RBC: 4.4 x10E6/uL (ref 3.77–5.28)
RDW: 13.1 % (ref 11.7–15.4)
WBC: 5.8 10*3/uL (ref 3.4–10.8)

## 2023-11-24 LAB — COMPREHENSIVE METABOLIC PANEL WITH GFR
ALT: 13 IU/L (ref 0–32)
AST: 17 IU/L (ref 0–40)
Albumin: 4.5 g/dL (ref 3.8–4.8)
Alkaline Phosphatase: 77 IU/L (ref 44–121)
BUN/Creatinine Ratio: 19 (ref 12–28)
BUN: 32 mg/dL — ABNORMAL HIGH (ref 8–27)
Bilirubin Total: 0.6 mg/dL (ref 0.0–1.2)
CO2: 20 mmol/L (ref 20–29)
Calcium: 9.5 mg/dL (ref 8.7–10.3)
Chloride: 107 mmol/L — ABNORMAL HIGH (ref 96–106)
Creatinine, Ser: 1.66 mg/dL — ABNORMAL HIGH (ref 0.57–1.00)
Globulin, Total: 2.4 g/dL (ref 1.5–4.5)
Glucose: 161 mg/dL — ABNORMAL HIGH (ref 70–99)
Potassium: 4.9 mmol/L (ref 3.5–5.2)
Sodium: 144 mmol/L (ref 134–144)
Total Protein: 6.9 g/dL (ref 6.0–8.5)
eGFR: 32 mL/min/{1.73_m2} — ABNORMAL LOW (ref >59)

## 2023-11-25 LAB — GI PROFILE, STOOL, PCR

## 2023-11-28 NOTE — Assessment & Plan Note (Signed)
 Recommend hydrate.  Check labs.  Check stool studies.

## 2023-12-10 NOTE — Progress Notes (Unsigned)
 Subjective:  Patient ID: Alicia Jordan, female    DOB: February 19, 1946  Age: 78 y.o. MRN: [id number]  Chief Complaint  Patient presents with   Medical Management of Chronic Issues   Discussed the use of AI scribe software for clinical note transcription with the patient, who gave verbal consent to proceed.  History of Present Illness   Alicia Jordan is a 78 year old female with diabetes who presents with diarrhea and abdominal pain.  Diarrhea - Diarrhea for the past three weeks - Initial frequency of five to six bowel movements per day, including nocturnal episodes - Frequency decreased with use of Imodium, but stools remain very soft ('really soft'), not watery - No recent antibiotic use - Previous stool studies normal  Abdominal pain and bloating - Abdominal pain, especially in the lower abdomen - Abdominal bloating, particularly severe yesterday  Rectal bleeding - Bright red blood present when wiping - No rawness or significant pain in the anal area  Constitutional symptoms - No fever, chills, or sweats - Feeling of energy depletion  Upper respiratory symptoms - Persistent runny nose - Improved somewhat with azelastine  nasal spray - No earaches or sore throat  Cardiopulmonary symptoms - No chest pain or breathing problems  Medication changes - No new medications started except for an allergy  nasal spray (azelastine )      Diabetes:   Complications: nephropathy. CKD 3b. Checks sugars fasting every day Log: 140-170 Checking feet daily.  Last HbA1c: 6.8 Eye exam: September 2024 No polydipsia, no polyphagia, no polyuria.  Medications: Farxiga  10 mg daily, januvia  50 mg daily  Hyperlipidemia:  Taking Simvastatin  40 mg daily, Zetia  10 mg daily, lovaza .  Hypertension associated with CKD:  Losartan  100 mg daily.    Depression/Anxiety: on fluoxetine  40 mg daily. Well controlled.   GERD: Pantoprazole  40 mg daily, famotidine  20 mg daily  Vitamin D  deficiency:  Vitamin D  50,000 units 1 capsule once weekly.       12/11/2023    8:16 AM 11/23/2023    3:05 PM 09/09/2023    1:46 PM 05/28/2023   10:09 AM 02/19/2023    9:34 AM  Depression screen PHQ 2/9  Decreased Interest 0 0 0 0 0  Down, Depressed, Hopeless 0 0 0 0 0  PHQ - 2 Score 0 0 0 0 0  Altered sleeping 0 0  0 1  Tired, decreased energy 0 0  0 1  Change in appetite 0 0  0 0  Feeling bad or failure about yourself  0 0  0 0  Trouble concentrating 0 0  0 0  Moving slowly or fidgety/restless 0 0  0 0  Suicidal thoughts 0 0  0 0  PHQ-9 Score 0 0  0 2  Difficult doing work/chores Not difficult at all Not difficult at all  Not difficult at all Not difficult at all        12/11/2023    8:15 AM  Fall Risk   Falls in the past year? 0  Number falls in past yr: 0  Injury with Fall? 0  Risk for fall due to : No Fall Risks  Follow up Falls evaluation completed  \    12/11/2023    8:16 AM 05/28/2023   10:09 AM 02/19/2023    9:35 AM 11/05/2022    8:54 AM  GAD 7 : Generalized Anxiety Score  Nervous, Anxious, on Edge 0 0 0 0  Control/stop worrying 0 0 1 0  Worry too  much - different things 0 0 1 0  Trouble relaxing 1 0 0 0  Restless 0 0 0 0  Easily annoyed or irritable 0 0 0 0  Afraid - awful might happen 0 0 0 0  Total GAD 7 Score 1 0 2 0  Anxiety Difficulty Not difficult at all Not difficult at all Somewhat difficult Not difficult at all      Patient Care Team: [name], MD as PCP - General (Family Medicine) Charlanne Groom, MD as Consulting Physician (Gastroenterology) Larnell Purchase, MD as Referring Physician (Orthopedic Surgery) Rubie Kemps, MD as Consulting Physician (Orthopedic Surgery)   Review of Systems  Constitutional:  Negative for chills, fatigue and fever.  HENT:  Negative for congestion, ear pain and sore throat.   Respiratory:  Negative for cough and shortness of breath.   Cardiovascular:  Negative for chest pain and palpitations.  Gastrointestinal:  Positive for  abdominal pain (Left upper quadrant) and diarrhea. Negative for constipation, nausea and vomiting.  Endocrine: Negative for polydipsia, polyphagia and polyuria.  Genitourinary:  Negative for difficulty urinating and dysuria.  Musculoskeletal:  Negative for arthralgias, back pain and myalgias.  Skin:  Negative for rash.  Neurological:  Negative for headaches.  Psychiatric/Behavioral:  Negative for dysphoric mood. The patient is not nervous/anxious.     Current Outpatient Medications on File Prior to Visit  Medication Sig Dispense Refill   Acetaminophen (TYLENOL PO) Take by mouth as needed.     albuterol  (VENTOLIN  HFA) 108 (90 Base) MCG/ACT inhaler Inhale 2 puffs into the lungs every 6 (six) hours as needed for wheezing or shortness of breath. 8 g 2   azelastine  (ASTELIN ) 0.1 % nasal spray Place 2 sprays into both nostrils 2 (two) times daily as needed for rhinitis. Use in each nostril as directed 30 mL 5   cetirizine  (ZYRTEC ) 10 MG tablet Take 1 tablet (10 mg total) by mouth daily. 90 tablet 3   cholecalciferol  (VITAMIN D3) 25 MCG (1000 UNIT) tablet Take 1 tablet (1,000 Units total) by mouth daily. 180 tablet 3   dapagliflozin  propanediol (FARXIGA ) 10 MG TABS tablet Take 1 tablet (10 mg) by mouth daily before breakfast. 90 tablet 1   ezetimibe  (ZETIA ) 10 MG tablet Take 1 tablet (10 mg) by mouth daily. 90 tablet 1   famotidine  (PEPCID ) 20 MG tablet Take 1 tablet (20 mg) by mouth at bedtime. 90 tablet 0   FLUoxetine  (PROZAC ) 20 MG capsule Take 2 capsules (40 mg) by mouth daily. 180 capsule 1   glucose blood test strip 1 each by Other route 2 (two) times daily. Use as instructed 100 each 2   ipratropium (ATROVENT ) 0.03 % nasal spray Place 1 spray into both nostrils 3 (three) times daily as needed for rhinitis (runny nose/drainage. use less if becomes too dry.). 30 mL 12   losartan  (COZAAR ) 100 MG tablet Take 1 tablet (100 mg total) by mouth every evening. 90 tablet 1   montelukast  (SINGULAIR ) 10  MG tablet Take 1 tablet (10 mg total) by mouth at bedtime. 90 tablet 3   omega-3 acid ethyl esters (LOVAZA ) 1 g capsule Take 2 capsules (2 g total) by mouth 2 (two) times daily. 360 capsule 1   ondansetron  (ZOFRAN ) 4 MG tablet Take 1 tablet (4 mg total) by mouth every 8 (eight) hours as needed for nausea or vomiting. 20 tablet 0   pantoprazole  (PROTONIX ) 40 MG tablet Take 1 tablet (40 mg total) by mouth 2 (two) times daily. 180 tablet  0   simvastatin  (ZOCOR ) 40 MG tablet TAKE 1 TABLET BY MOUTH AT BEDTIME 90 tablet 1   sitaGLIPtin  (JANUVIA ) 50 MG tablet Take 1 tablet by mouth once daily 90 tablet 0   sodium chloride  (OCEAN) 0.65 % SOLN nasal spray Place 1 spray into both nostrils as needed for congestion. Use prior to medicated nasal sprays 88 mL 3   Vitamin D , Ergocalciferol , (DRISDOL ) 1.25 MG (50000 UNIT) CAPS capsule Take 1 capsule (50,000 Units total) by mouth every 7 (seven) days on Saturday. 5 capsule 1   vitamin B-12 (CYANOCOBALAMIN ) 100 MCG tablet Take 100 mcg by mouth daily. (Patient not taking: Reported on 12/11/2023)     No current facility-administered medications on file prior to visit.   Past Medical History:  Diagnosis Date   Allergy     Anemia    Anxiety    Arthralgia of left temporomandibular joint    Arthritis    Cervicalgia    Chronic kidney disease, stage 3a (HCC)    Depression    Diabetes mellitus without complication (HCC)    Hesitancy of micturition    Hyperlipidemia    Hypertension    Osteoporosis    Other psoriasis    Other specified menopausal and perimenopausal disorders    Overweight    Repeated falls    Residual hemorrhoidal skin tags    Past Surgical History:  Procedure Laterality Date   ABDOMINAL HYSTERECTOMY     APPENDECTOMY     CARPAL TUNNEL RELEASE Right    CATARACT EXTRACTION W/ INTRAOCULAR LENS IMPLANT Left 04/01/2023   CATARACT EXTRACTION W/ INTRAOCULAR LENS IMPLANT Right 03/18/2023   CHOLECYSTECTOMY     COLONOSCOPY  04/09/2016   Mild  sigmoid diverticulosis. Otherwise normal colonoscopy   ESOPHAGOGASTRODUODENOSCOPY  09/10/2012   No obvious esophageal stricture, status post empiric esophageal dilation. Irregular GE junction, questionable significance. Mild gastritis.   eye lid surgery     KNEE SURGERY Left    REPLACEMENT TOTAL KNEE Right 06/03/2021    Family History  Problem Relation Age of Onset   Diabetes type II Mother    Hyperlipidemia Mother    Hypertension Mother    Dementia Mother    Heart attack Father    Hyperlipidemia Father    Hypertension Father    Stroke Father    Diabetes type II Father    Colon cancer Daughter 60   Cancer Daughter    Esophageal cancer Neg Hx    Rectal cancer Neg Hx    Stomach cancer Neg Hx    Social History   Socioeconomic History   Marital status: Married    Spouse name: Helayne   Number of children: 4   Years of education: Not on file   Highest education level: Not on file  Occupational History   Occupation: Homemaker  Tobacco Use   Smoking status: Never    Passive exposure: Never   Smokeless tobacco: Never  Vaping Use   Vaping status: Never Used  Substance and Sexual Activity   Alcohol use: Never   Drug use: Never   Sexual activity: Not Currently  Other Topics Concern   Not on file  Social History Narrative   Millianna spends a lot if time helping her daughter get to and from oncology appointments   Her husband has started drinking alcohol again   Social Drivers of Health   Financial Resource Strain: Medium Risk (02/19/2023)   Overall Financial Resource Strain (CARDIA)    Difficulty of Paying Living Expenses: Somewhat hard  Food Insecurity: No Food Insecurity (02/19/2023)   Hunger Vital Sign    Worried About Running Out of Food in the Last Year: Never true    Ran Out of Food in the Last Year: Never true  Transportation Needs: No Transportation Needs (02/19/2023)   PRAPARE - Administrator, Civil Service (Medical): No    Lack of Transportation  (Non-Medical): No  Physical Activity: Sufficiently Active (02/19/2023)   Exercise Vital Sign    Days of Exercise per Week: 5 days    Minutes of Exercise per Session: 30 min  Stress: No Stress Concern Present (02/19/2023)   [employer] of Occupational Health - Occupational Stress Questionnaire    Feeling of Stress : Only a little  Social Connections: Socially Integrated (02/19/2023)   Social Connection and Isolation Panel    Frequency of Communication with Friends and Family: More than three times a week    Frequency of Social Gatherings with Friends and Family: More than three times a week    Attends Religious Services: More than 4 times per year    Active Member of [employer] or Organizations: Yes    Attends Engineer, structural: More than 4 times per year    Marital Status: Married    Objective:  BP 104/64   Pulse 67   Temp (!) 97.3 F (36.3 C)   Resp 16   Ht 5' 1.4 (1.56 m)   Wt 148 lb (67.1 kg)   SpO2 98%   BMI 27.60 kg/m      12/11/2023    8:02 AM 11/23/2023    3:00 PM 09/09/2023    1:43 PM  BP/Weight  Systolic BP 104 116 128  Diastolic BP 64 58 86  Wt. (Lbs) 148 150 178  BMI 27.6 kg/m2 27.97 kg/m2 33.17 kg/m2    Physical Exam Vitals reviewed.  Constitutional:      Appearance: Normal appearance. She is normal weight.  Neck:     Vascular: No carotid bruit.   Cardiovascular:     Rate and Rhythm: Normal rate and regular rhythm.     Heart sounds: Normal heart sounds.  Pulmonary:     Effort: Pulmonary effort is normal. No respiratory distress.     Breath sounds: Normal breath sounds.  Abdominal:     General: Abdomen is flat. Bowel sounds are normal.     Palpations: Abdomen is soft.     Tenderness: There is abdominal tenderness (EPIGASTRIC AND LLQ.).   Neurological:     Mental Status: She is alert and oriented to person, place, and time.   Psychiatric:        Mood and Affect: Mood normal.        Behavior: Behavior normal.      Diabetic foot exam  was performed with the following findings:   No deformities, ulcerations, or other skin breakdown Normal sensation of 10g monofilament Intact posterior tibialis and dorsalis pedis pulses      Lab Results  Component Value Date   WBC 5.8 12/11/2023   HGB 12.6 12/11/2023   HCT 42.0 12/11/2023   PLT 173 12/11/2023   GLUCOSE 162 (H) 12/11/2023   CHOL 128 02/19/2023   TRIG 92 02/19/2023   HDL 50 02/19/2023   LDLCALC 61 02/19/2023   ALT 224 (HH) 12/11/2023   AST 88 (H) 12/11/2023   NA 143 12/11/2023   K 4.4 12/11/2023   CL 105 12/11/2023   CREATININE 1.34 (H) 12/11/2023   BUN 22 12/11/2023  CO2 24 12/11/2023   TSH 3.260 05/28/2023   INR 1.0 12/14/2020   HGBA1C 6.8 (H) 05/28/2023   MICROALBUR 30 03/29/2021      Assessment & Plan:  Hypertensive kidney disease with stage 3b chronic kidney disease (HCC) Assessment & Plan: Well controlled.  No changes to medicines.  Continue Losartan  100 mg daily.   Continue to work on eating a healthy diet and exercise.  Labs drawn today.     Diabetic glomerulopathy (HCC) Assessment & Plan: Control: Controlled Recommend check sugars fasting daily. Recommend check feet daily. Recommend annual eye exams. Medicines: Farxiga  10 mg daily, januvia  50 mg daily Continue to work on eating a healthy diet and exercise.  Labs drawn today.     Orders: -     Hemoglobin A1c  Mixed hyperlipidemia Assessment & Plan: Well controlled.  No changes to medicines. Continue Simvastatin  40 mg daily, Zetia  10 mg daily and lovaza  Continue to work on eating a healthy diet and exercise.  Labs drawn today.    Orders: -     Lipid panel -     TSH  Vitamin D  deficiency Assessment & Plan: Check levels  Orders: -     VITAMIN D  25 Hydroxy (Vit-D Deficiency, Fractures)  B12 deficiency Assessment & Plan: Check labs   Diarrhea, unspecified type Assessment & Plan: Check labs   Left lower quadrant abdominal pain Assessment & Plan: Check  labs Ordered CT abdomen pelvis wo contrast  Orders: -     CT ABDOMEN PELVIS WO CONTRAST; Future -     CBC with Differential/Platelet -     Comprehensive metabolic panel with GFR -     Lactate dehydrogenase  Vitamin B 12 deficiency Assessment & Plan: Check labs  Orders: -     B12 and Folate Panel -     Methylmalonic acid, serum     No orders of the defined types were placed in this encounter.   Orders Placed This Encounter  Procedures   CT ABDOMEN PELVIS WO CONTRAST   CBC with Differential/Platelet   Comprehensive metabolic panel with GFR   Lactate Dehydrogenase   Hemoglobin A1c   Lipid panel   VITAMIN D  25 Hydroxy (Vit-D Deficiency, Fractures)   TSH   B12 and Folate Panel   Methylmalonic acid, serum     Follow-up: Return in about 3 months (around 03/12/2024) for chronic follow up.   I,Marla I Leal-Borjas,acting as a scribe for Abigail Free, MD.,have documented all relevant documentation on the behalf of Abigail Free, MD,as directed by  Abigail Free, MD while in the presence of Abigail Free, MD.   An After Visit Summary was printed and given to the patient.  I attest that I have reviewed this visit and agree with the plan scribed by my staff.   Abigail Free, MD [facility] [phone]

## 2023-12-11 ENCOUNTER — Encounter: Payer: Self-pay | Admitting: Family Medicine

## 2023-12-11 ENCOUNTER — Ambulatory Visit (INDEPENDENT_AMBULATORY_CARE_PROVIDER_SITE_OTHER): Admitting: Family Medicine

## 2023-12-11 ENCOUNTER — Ambulatory Visit (HOSPITAL_BASED_OUTPATIENT_CLINIC_OR_DEPARTMENT_OTHER)
Admission: RE | Admit: 2023-12-11 | Discharge: 2023-12-11 | Disposition: A | Discharge status: Home / Self Care | Source: Ambulatory Visit | Attending: Family Medicine | Admitting: Family Medicine

## 2023-12-11 VITALS — BP 104/64 | HR 67 | Temp 97.3°F | Resp 16 | Ht 61.4 in | Wt 148.0 lb

## 2023-12-11 DIAGNOSIS — I129 Hypertensive chronic kidney disease with stage 1 through stage 4 chronic kidney disease, or unspecified chronic kidney disease: Secondary | ICD-10-CM | POA: Diagnosis not present

## 2023-12-11 DIAGNOSIS — N1832 Chronic kidney disease, stage 3b: Secondary | ICD-10-CM | POA: Diagnosis not present

## 2023-12-11 DIAGNOSIS — R1032 Left lower quadrant pain: Secondary | ICD-10-CM

## 2023-12-11 DIAGNOSIS — R197 Diarrhea, unspecified: Secondary | ICD-10-CM

## 2023-12-11 DIAGNOSIS — R945 Abnormal results of liver function studies: Secondary | ICD-10-CM | POA: Diagnosis not present

## 2023-12-11 DIAGNOSIS — E559 Vitamin D deficiency, unspecified: Secondary | ICD-10-CM

## 2023-12-11 DIAGNOSIS — E782 Mixed hyperlipidemia: Secondary | ICD-10-CM | POA: Diagnosis not present

## 2023-12-11 DIAGNOSIS — E1121 Type 2 diabetes mellitus with diabetic nephropathy: Secondary | ICD-10-CM

## 2023-12-11 DIAGNOSIS — E538 Deficiency of other specified B group vitamins: Secondary | ICD-10-CM | POA: Diagnosis not present

## 2023-12-11 DIAGNOSIS — K5730 Diverticulosis of large intestine without perforation or abscess without bleeding: Secondary | ICD-10-CM | POA: Diagnosis not present

## 2023-12-12 LAB — COMPREHENSIVE METABOLIC PANEL WITH GFR
ALT: 224 IU/L (ref 0–32)
AST: 88 IU/L — ABNORMAL HIGH (ref 0–40)
Albumin: 4.1 g/dL (ref 3.8–4.8)
Alkaline Phosphatase: 184 IU/L — ABNORMAL HIGH (ref 44–121)
BUN/Creatinine Ratio: 16 (ref 12–28)
BUN: 22 mg/dL (ref 8–27)
Bilirubin Total: 0.7 mg/dL (ref 0.0–1.2)
CO2: 24 mmol/L (ref 20–29)
Calcium: 8.9 mg/dL (ref 8.7–10.3)
Chloride: 105 mmol/L (ref 96–106)
Creatinine, Ser: 1.34 mg/dL — ABNORMAL HIGH (ref 0.57–1.00)
Globulin, Total: 2.4 g/dL (ref 1.5–4.5)
Glucose: 162 mg/dL — ABNORMAL HIGH (ref 70–99)
Potassium: 4.4 mmol/L (ref 3.5–5.2)
Sodium: 143 mmol/L (ref 134–144)
Total Protein: 6.5 g/dL (ref 6.0–8.5)
eGFR: 41 mL/min/{1.73_m2} — ABNORMAL LOW (ref >59)

## 2023-12-12 LAB — CBC WITH DIFFERENTIAL/PLATELET
Basophils Absolute: 0 10*3/uL (ref 0.0–0.2)
Basos: 0 %
EOS (ABSOLUTE): 0.1 10*3/uL (ref 0.0–0.4)
Eos: 2 %
Hematocrit: 42 % (ref 34.0–46.6)
Hemoglobin: 12.6 g/dL (ref 11.1–15.9)
Immature Grans (Abs): 0 10*3/uL (ref 0.0–0.1)
Immature Granulocytes: 0 %
Lymphocytes Absolute: 1.1 10*3/uL (ref 0.7–3.1)
Lymphs: 18 %
MCH: 28.8 pg (ref 26.6–33.0)
MCHC: 30 g/dL — ABNORMAL LOW (ref 31.5–35.7)
MCV: 96 fL (ref 79–97)
Monocytes Absolute: 0.3 10*3/uL (ref 0.1–0.9)
Monocytes: 6 %
Neutrophils Absolute: 4.2 10*3/uL (ref 1.4–7.0)
Neutrophils: 74 %
Platelets: 173 10*3/uL (ref 150–450)
RBC: 4.38 x10E6/uL (ref 3.77–5.28)
RDW: 13.4 % (ref 11.7–15.4)
WBC: 5.8 10*3/uL (ref 3.4–10.8)

## 2023-12-12 LAB — LACTATE DEHYDROGENASE: LDH: 203 IU/L (ref 119–226)

## 2023-12-13 ENCOUNTER — Ambulatory Visit: Payer: Self-pay | Admitting: Family Medicine

## 2023-12-13 ENCOUNTER — Other Ambulatory Visit: Payer: Self-pay | Admitting: Family Medicine

## 2023-12-13 DIAGNOSIS — R7401 Elevation of levels of liver transaminase levels: Secondary | ICD-10-CM

## 2023-12-13 NOTE — Assessment & Plan Note (Signed)
 Well controlled.  No changes to medicines. Continue Simvastatin  40 mg daily, Zetia  10 mg daily and lovaza  Continue to work on eating a healthy diet and exercise.  Labs drawn today.

## 2023-12-13 NOTE — Assessment & Plan Note (Signed)
 Control: Controlled Recommend check sugars fasting daily. Recommend check feet daily. Recommend annual eye exams. Medicines: Farxiga  10 mg daily, januvia  50 mg daily Continue to work on eating a healthy diet and exercise.  Labs drawn today.

## 2023-12-13 NOTE — Assessment & Plan Note (Signed)
 Check labs Ordered CT abdomen pelvis wo contrast

## 2023-12-13 NOTE — Assessment & Plan Note (Signed)
 Check labs

## 2023-12-13 NOTE — Assessment & Plan Note (Signed)
Well controlled.  No changes to medicines.  Continue Losartan 100 mg daily.   Continue to work on eating a healthy diet and exercise.  Labs drawn today.

## 2023-12-13 NOTE — Assessment & Plan Note (Signed)
 Check levels

## 2023-12-14 ENCOUNTER — Other Ambulatory Visit: Payer: Self-pay

## 2023-12-14 ENCOUNTER — Telehealth: Payer: Self-pay

## 2023-12-14 ENCOUNTER — Other Ambulatory Visit

## 2023-12-14 DIAGNOSIS — R7401 Elevation of levels of liver transaminase levels: Secondary | ICD-10-CM

## 2023-12-14 NOTE — Telephone Encounter (Signed)
 [facility] called with critical lab values...  [id number]

## 2023-12-14 NOTE — Telephone Encounter (Signed)
 addressed

## 2023-12-15 ENCOUNTER — Telehealth: Payer: Self-pay

## 2023-12-15 ENCOUNTER — Other Ambulatory Visit: Payer: Self-pay

## 2023-12-15 ENCOUNTER — Other Ambulatory Visit: Payer: Self-pay | Admitting: Family Medicine

## 2023-12-15 ENCOUNTER — Ambulatory Visit: Payer: Self-pay | Admitting: Family Medicine

## 2023-12-15 DIAGNOSIS — R7989 Other specified abnormal findings of blood chemistry: Secondary | ICD-10-CM

## 2023-12-15 DIAGNOSIS — R1032 Left lower quadrant pain: Secondary | ICD-10-CM

## 2023-12-15 DIAGNOSIS — R197 Diarrhea, unspecified: Secondary | ICD-10-CM

## 2023-12-15 LAB — HEPATIC FUNCTION PANEL
ALT: 209 IU/L (ref 0–32)
AST: 83 IU/L — ABNORMAL HIGH (ref 0–40)
Albumin: 4.2 g/dL (ref 3.8–4.8)
Alkaline Phosphatase: 188 IU/L — ABNORMAL HIGH (ref 44–121)
Bilirubin Total: 0.6 mg/dL (ref 0.0–1.2)
Bilirubin, Direct: 0.21 mg/dL (ref 0.00–0.40)
Total Protein: 6.3 g/dL (ref 6.0–8.5)

## 2023-12-15 LAB — COMPREHENSIVE METABOLIC PANEL WITH GFR
ALT: 120 IU/L — ABNORMAL HIGH (ref 0–32)
AST: 48 IU/L — ABNORMAL HIGH (ref 0–40)
Albumin: 4.1 g/dL (ref 3.8–4.8)
Alkaline Phosphatase: 191 IU/L — ABNORMAL HIGH (ref 44–121)
BUN/Creatinine Ratio: 14 (ref 12–28)
BUN: 20 mg/dL (ref 8–27)
Bilirubin Total: 0.6 mg/dL (ref 0.0–1.2)
CO2: 21 mmol/L (ref 20–29)
Calcium: 9.1 mg/dL (ref 8.7–10.3)
Chloride: 104 mmol/L (ref 96–106)
Creatinine, Ser: 1.38 mg/dL — ABNORMAL HIGH (ref 0.57–1.00)
Globulin, Total: 2.2 g/dL (ref 1.5–4.5)
Glucose: 216 mg/dL — ABNORMAL HIGH (ref 70–99)
Potassium: 4.7 mmol/L (ref 3.5–5.2)
Sodium: 142 mmol/L (ref 134–144)
Total Protein: 6.3 g/dL (ref 6.0–8.5)
eGFR: 40 mL/min/{1.73_m2} — ABNORMAL LOW (ref >59)

## 2023-12-15 LAB — SPECIMEN STATUS REPORT

## 2023-12-15 NOTE — Telephone Encounter (Signed)
 Copied from CRM [id number]. Topic: Referral - Question >> Dec 15, 2023  2:23 PM Emylou G wrote: Reason for CRM: Husband called: Alicia Jordan - in regards to referral.. hoping he said to get someone in [place] group ( might be faster than [name] )

## 2023-12-17 LAB — ACUTE VIRAL HEPATITIS (HAV, HBV, HCV)
HCV Ab: NONREACTIVE
Hep A IgM: NEGATIVE
Hep B C IgM: NEGATIVE
Hepatitis B Surface Ag: NEGATIVE

## 2023-12-17 LAB — HCV INTERPRETATION

## 2023-12-17 LAB — SPECIMEN STATUS REPORT

## 2023-12-19 ENCOUNTER — Other Ambulatory Visit (HOSPITAL_COMMUNITY): Payer: Self-pay

## 2023-12-26 ENCOUNTER — Other Ambulatory Visit: Payer: Self-pay | Admitting: Family Medicine

## 2023-12-28 ENCOUNTER — Other Ambulatory Visit (HOSPITAL_COMMUNITY): Payer: Self-pay

## 2023-12-28 DIAGNOSIS — E663 Overweight: Secondary | ICD-10-CM | POA: Diagnosis not present

## 2023-12-28 DIAGNOSIS — E785 Hyperlipidemia, unspecified: Secondary | ICD-10-CM | POA: Diagnosis not present

## 2023-12-28 DIAGNOSIS — N1832 Chronic kidney disease, stage 3b: Secondary | ICD-10-CM | POA: Diagnosis not present

## 2023-12-28 DIAGNOSIS — J309 Allergic rhinitis, unspecified: Secondary | ICD-10-CM | POA: Diagnosis not present

## 2023-12-28 DIAGNOSIS — I10 Essential (primary) hypertension: Secondary | ICD-10-CM | POA: Diagnosis not present

## 2023-12-28 DIAGNOSIS — E1142 Type 2 diabetes mellitus with diabetic polyneuropathy: Secondary | ICD-10-CM | POA: Diagnosis not present

## 2023-12-28 DIAGNOSIS — E559 Vitamin D deficiency, unspecified: Secondary | ICD-10-CM | POA: Diagnosis not present

## 2023-12-28 DIAGNOSIS — I129 Hypertensive chronic kidney disease with stage 1 through stage 4 chronic kidney disease, or unspecified chronic kidney disease: Secondary | ICD-10-CM | POA: Diagnosis not present

## 2023-12-28 DIAGNOSIS — K219 Gastro-esophageal reflux disease without esophagitis: Secondary | ICD-10-CM | POA: Diagnosis not present

## 2023-12-28 DIAGNOSIS — E1122 Type 2 diabetes mellitus with diabetic chronic kidney disease: Secondary | ICD-10-CM | POA: Diagnosis not present

## 2023-12-28 DIAGNOSIS — E1169 Type 2 diabetes mellitus with other specified complication: Secondary | ICD-10-CM | POA: Diagnosis not present

## 2023-12-28 DIAGNOSIS — F330 Major depressive disorder, recurrent, mild: Secondary | ICD-10-CM | POA: Diagnosis not present

## 2024-01-02 ENCOUNTER — Encounter: Payer: Self-pay | Admitting: Family Medicine

## 2024-01-02 LAB — MICROALBUMIN / CREATININE URINE RATIO: Microalb Creat Ratio: <30

## 2024-01-05 ENCOUNTER — Ambulatory Visit: Admitting: Allergy

## 2024-01-05 ENCOUNTER — Other Ambulatory Visit: Payer: Self-pay | Admitting: Internal Medicine

## 2024-01-05 ENCOUNTER — Encounter: Payer: Self-pay | Admitting: Allergy

## 2024-01-05 VITALS — BP 108/64 | HR 68 | Resp 16

## 2024-01-05 DIAGNOSIS — K219 Gastro-esophageal reflux disease without esophagitis: Secondary | ICD-10-CM | POA: Diagnosis not present

## 2024-01-05 DIAGNOSIS — J3089 Other allergic rhinitis: Secondary | ICD-10-CM

## 2024-01-05 DIAGNOSIS — J302 Other seasonal allergic rhinitis: Secondary | ICD-10-CM | POA: Diagnosis not present

## 2024-01-05 DIAGNOSIS — R053 Chronic cough: Secondary | ICD-10-CM | POA: Diagnosis not present

## 2024-01-05 MED ORDER — AZELASTINE HCL 0.1 % NA SOLN: 2.0000 | Freq: Two times a day (BID) | NASAL | 5 refills | Status: AC | PRN

## 2024-01-05 MED ORDER — IPRATROPIUM BROMIDE 0.03 % NA SOLN: 1.0000 | Freq: Three times a day (TID) | NASAL | 5 refills | Status: AC | PRN

## 2024-01-05 MED ORDER — ALBUTEROL SULFATE HFA 108 (90 BASE) MCG/ACT IN AERS: 2.0000 | INHALATION_SPRAY | RESPIRATORY_TRACT | 1 refills | Status: DC | PRN

## 2024-01-05 MED ORDER — CETIRIZINE HCL 10 MG PO TABS: 10.0000 mg | ORAL_TABLET | Freq: Every day | ORAL | 3 refills | Status: AC

## 2024-01-05 NOTE — Progress Notes (Signed)
 Follow-up Note  RE: Alicia Jordan MRN: [id number] DOB: Feb 25, 1946 Date of Office Visit: 01/05/2024   History of present illness: Alicia Jordan is a 78 y.o. female presenting today for follow-up of allergic rhinitis, cough and GERD.  She was last seen in the office on 09/01/23 by myself for skin testing.   Discussed the use of AI scribe software for clinical note transcription with the patient, who gave verbal consent to proceed.  She is undergoing a workup for abnormal liver labs and reports she will see a gastroenterologist after seeing the PA in two weeks. She experiences loose and frequent diarrhea, necessitating quick access to a bathroom. She is currently taking Immodium, which helps slow down the diarrhea.  She has a history of sinus allergies and reports improvement in her symptoms since the last visit.  Her allergy  symptoms include nasal congestion, drainage, and occasionally affect her eyes and ears.  She takes Zyrtec  as needed and states has stopped relying on benadryl for allergy  control. She uses two nasal sprays, Astelin  (azelastine ) and Atrovent  (ipratropium), as needed to manage nasal drainage and congestion. She uses an albuterol  inhaler near daily as a preventive measure for respiratory symptoms. She continues on Protonix  and pepcid  for reflux but continues with Pepcid .  Review of systems: 10pt ROS negative unless noted above in HPI   Past medical/social/surgical/family history have been reviewed and are unchanged unless specifically indicated below.  No changes  Medication List: Current Outpatient Medications  Medication Sig Dispense Refill   Acetaminophen (TYLENOL PO) Take by mouth as needed.     albuterol  (VENTOLIN  HFA) 108 (90 Base) MCG/ACT inhaler Inhale 2 puffs into the lungs every 6 (six) hours as needed for wheezing or shortness of breath. 8 g 2   azelastine  (ASTELIN ) 0.1 % nasal spray Place 2 sprays into both nostrils 2 (two) times daily as needed for  rhinitis. Use in each nostril as directed 30 mL 5   cetirizine  (ZYRTEC ) 10 MG tablet Take 1 tablet (10 mg total) by mouth daily. 90 tablet 3   cholecalciferol  (VITAMIN D3) 25 MCG (1000 UNIT) tablet Take 1 tablet (1,000 Units total) by mouth daily. 180 tablet 3   dapagliflozin  propanediol (FARXIGA ) 10 MG TABS tablet Take 1 tablet (10 mg) by mouth daily before breakfast. 90 tablet 1   ezetimibe  (ZETIA ) 10 MG tablet Take 1 tablet (10 mg) by mouth daily. 90 tablet 1   famotidine  (PEPCID ) 20 MG tablet Take 1 tablet (20 mg) by mouth at bedtime. 90 tablet 0   FLUoxetine  (PROZAC ) 20 MG capsule Take 2 capsules (40 mg) by mouth daily. 180 capsule 1   glucose blood test strip 1 each by Other route 2 (two) times daily. Use as instructed 100 each 2   ipratropium (ATROVENT ) 0.03 % nasal spray Place 1 spray into both nostrils 3 (three) times daily as needed for rhinitis (runny nose/drainage. use less if becomes too dry.). 30 mL 12   losartan  (COZAAR ) 100 MG tablet Take 1 tablet (100 mg total) by mouth every evening. 90 tablet 1   montelukast  (SINGULAIR ) 10 MG tablet Take 1 tablet (10 mg total) by mouth at bedtime. 90 tablet 3   omega-3 acid ethyl esters (LOVAZA ) 1 g capsule Take 2 capsules (2 g total) by mouth 2 (two) times daily. 360 capsule 1   ondansetron  (ZOFRAN ) 4 MG tablet Take 1 tablet (4 mg total) by mouth every 8 (eight) hours as needed for nausea or vomiting. 20 tablet 0  pantoprazole  (PROTONIX ) 40 MG tablet Take 1 tablet (40 mg total) by mouth 2 (two) times daily. 180 tablet 0   simvastatin  (ZOCOR ) 40 MG tablet TAKE 1 TABLET BY MOUTH AT BEDTIME 90 tablet 0   sitaGLIPtin  (JANUVIA ) 50 MG tablet Take 1 tablet by mouth once daily 90 tablet 0   sodium chloride  (OCEAN) 0.65 % SOLN nasal spray Place 1 spray into both nostrils as needed for congestion. Use prior to medicated nasal sprays 88 mL 3   vitamin B-12 (CYANOCOBALAMIN ) 100 MCG tablet Take 100 mcg by mouth daily.     Vitamin D , Ergocalciferol ,  (DRISDOL ) 1.25 MG (50000 UNIT) CAPS capsule Take 1 capsule (50,000 Units total) by mouth every 7 (seven) days on Saturday. 5 capsule 1   No current facility-administered medications for this visit.     Known medication allergies: Allergies  Allergen Reactions   Meloxicam Other (See Comments)   Rosuvastatin      Muscle pain   Semaglutide      Nausea, confusion, and blurred vision     Physical examination: Blood pressure 108/64, pulse 68, resp. rate 16, SpO2 99%.  General: Alert, interactive, in no acute distress. HEENT: PERRLA, TMs pearly gray, turbinates non-edematous without discharge, post-pharynx non erythematous. Neck: Supple without lymphadenopathy. Lungs: Clear to auscultation without wheezing, rhonchi or rales. {no increased work of breathing. CV: Normal S1, S2 without murmurs. Abdomen: Nondistended, nontender. Skin: Warm and dry, without lesions or rashes. Extremities:  No clubbing, cyanosis or edema. Neuro:   Grossly intact.  Diagnostics/Labs: None today  Assessment and plan: Chronic Rhinitis -continue avoidance measures for grasses, ragweed, weeds, trees, indoor molds, outdoor molds, dog, cockroach, horse, and mixed feathers.    -use saline nasal spray prior to medicated nasal sprays -use Azelastine  nasal spray 2 sprays twice daily as needed for nasal drainage/congestion.  Use this first line for nasal symptoms. -use Atrovent  nasal spray to reduce drainage as needed. 1-2 sprays up to three times as needed for drainage control.  Use less frequently if becomes too dry. -continue use of Zyrtec  10 mg daily as needed over Benadryl due to potential long-term memory effects of Benadryl.  Cough -continue to monitor symptoms, suspect related to drainage and reflux. -use albuterol  2 puffs every 4 hours as needed for coughing and shortness of breath.  Gastroesophageal Reflux Disease (GERD) - continue Protonix  40mg  twice daily and Pepcid  20mg  once daily. - continue current  medications as prescribed by PCP.  Follow-up in 4-6 months or sooner if needed  I appreciate the opportunity to take part in Alicia Jordan's care. Please do not hesitate to contact me with questions.  Sincerely,   Danita Brain, MD Allergy /Immunology [facility]

## 2024-01-05 NOTE — Patient Instructions (Addendum)
 Chronic Rhinitis -continue avoidance measures for grasses, ragweed, weeds, trees, indoor molds, outdoor molds, dog, cockroach, horse, and mixed feathers.    -use saline nasal spray prior to medicated nasal sprays -use Azelastine  nasal spray 2 sprays twice daily as needed for nasal drainage/congestion.  Use this first line for nasal symptoms. -use Atrovent  nasal spray to reduce drainage as needed. 1-2 sprays up to three times as needed for drainage control.  Use less frequently if becomes too dry. -continue use of Zyrtec  10 mg daily as needed over Benadryl due to potential long-term memory effects of Benadryl.  Cough -continue to monitor symptoms, suspect related to drainage and reflux. -use albuterol  2 puffs every 4 hours as needed for coughing and shortness of breath.  Gastroesophageal Reflux Disease (GERD) - continue Protonix  40mg  twice daily and Pepcid  20mg  once daily. - continue current medications as prescribed by PCP.  Follow-up in 4-6 months or sooner if needed

## 2024-01-06 ENCOUNTER — Other Ambulatory Visit

## 2024-01-06 ENCOUNTER — Other Ambulatory Visit: Payer: Self-pay

## 2024-01-06 DIAGNOSIS — R7989 Other specified abnormal findings of blood chemistry: Secondary | ICD-10-CM

## 2024-01-06 LAB — COMPREHENSIVE METABOLIC PANEL WITH GFR
ALT: 56 IU/L — ABNORMAL HIGH (ref 0–32)
AST: 41 IU/L — ABNORMAL HIGH (ref 0–40)
Albumin: 4.2 g/dL (ref 3.8–4.8)
Alkaline Phosphatase: 175 IU/L — ABNORMAL HIGH (ref 44–121)
BUN/Creatinine Ratio: 15 (ref 12–28)
BUN: 23 mg/dL (ref 8–27)
Bilirubin Total: 1 mg/dL (ref 0.0–1.2)
CO2: 24 mmol/L (ref 20–29)
Calcium: 9.4 mg/dL (ref 8.7–10.3)
Chloride: 103 mmol/L (ref 96–106)
Creatinine, Ser: 1.53 mg/dL — ABNORMAL HIGH (ref 0.57–1.00)
Globulin, Total: 2.3 g/dL (ref 1.5–4.5)
Glucose: 147 mg/dL — ABNORMAL HIGH (ref 70–99)
Potassium: 5.1 mmol/L (ref 3.5–5.2)
Sodium: 141 mmol/L (ref 134–144)
Total Protein: 6.5 g/dL (ref 6.0–8.5)
eGFR: 35 mL/min/1.73 — ABNORMAL LOW (ref >59)

## 2024-01-07 ENCOUNTER — Ambulatory Visit: Payer: Self-pay | Admitting: Family Medicine

## 2024-01-11 ENCOUNTER — Other Ambulatory Visit (HOSPITAL_COMMUNITY): Payer: Self-pay

## 2024-01-16 ENCOUNTER — Other Ambulatory Visit (HOSPITAL_COMMUNITY): Payer: Self-pay

## 2024-01-18 DIAGNOSIS — N1832 Chronic kidney disease, stage 3b: Secondary | ICD-10-CM | POA: Diagnosis not present

## 2024-01-19 ENCOUNTER — Ambulatory Visit: Admitting: Physician Assistant

## 2024-01-19 ENCOUNTER — Ambulatory Visit (INDEPENDENT_AMBULATORY_CARE_PROVIDER_SITE_OTHER)
Admission: RE | Admit: 2024-01-19 | Discharge: 2024-01-19 | Disposition: A | Discharge status: Home / Self Care | Source: Ambulatory Visit | Attending: Physician Assistant | Admitting: Physician Assistant

## 2024-01-19 ENCOUNTER — Other Ambulatory Visit (INDEPENDENT_AMBULATORY_CARE_PROVIDER_SITE_OTHER)

## 2024-01-19 ENCOUNTER — Encounter: Payer: Self-pay | Admitting: Physician Assistant

## 2024-01-19 ENCOUNTER — Ambulatory Visit: Payer: Self-pay | Admitting: Physician Assistant

## 2024-01-19 VITALS — BP 120/60 | HR 66 | Ht 61.0 in | Wt 150.0 lb

## 2024-01-19 DIAGNOSIS — K219 Gastro-esophageal reflux disease without esophagitis: Secondary | ICD-10-CM

## 2024-01-19 DIAGNOSIS — Z800 Family history of malignant neoplasm of digestive organs: Secondary | ICD-10-CM

## 2024-01-19 DIAGNOSIS — R197 Diarrhea, unspecified: Secondary | ICD-10-CM

## 2024-01-19 DIAGNOSIS — R7989 Other specified abnormal findings of blood chemistry: Secondary | ICD-10-CM

## 2024-01-19 DIAGNOSIS — D509 Iron deficiency anemia, unspecified: Secondary | ICD-10-CM

## 2024-01-19 LAB — CBC WITH DIFFERENTIAL/PLATELET
Basophils Absolute: 0 K/uL (ref 0.0–0.1)
Basophils Relative: 0.6 % (ref 0.0–3.0)
Eosinophils Absolute: 0.2 K/uL (ref 0.0–0.7)
Eosinophils Relative: 4.1 % (ref 0.0–5.0)
HCT: 40 % (ref 36.0–46.0)
Hemoglobin: 13.1 g/dL (ref 12.0–15.0)
Lymphocytes Relative: 31.2 % (ref 12.0–46.0)
Lymphs Abs: 1.7 K/uL (ref 0.7–4.0)
MCHC: 32.7 g/dL (ref 30.0–36.0)
MCV: 87.9 fl (ref 78.0–100.0)
Monocytes Absolute: 0.3 K/uL (ref 0.1–1.0)
Monocytes Relative: 6.2 % (ref 3.0–12.0)
Neutro Abs: 3.1 K/uL (ref 1.4–7.7)
Neutrophils Relative %: 57.9 % (ref 43.0–77.0)
Platelets: 155 K/uL (ref 150.0–400.0)
RBC: 4.55 Mil/uL (ref 3.87–5.11)
RDW: 15.2 % (ref 11.5–15.5)
WBC: 5.4 K/uL (ref 4.0–10.5)

## 2024-01-19 LAB — HEPATIC FUNCTION PANEL
ALT: 21 U/L (ref 0–35)
AST: 22 U/L (ref 0–37)
Albumin: 4.5 g/dL (ref 3.5–5.2)
Alkaline Phosphatase: 92 U/L (ref 39–117)
Bilirubin, Direct: 0.2 mg/dL (ref 0.0–0.3)
Total Bilirubin: 1 mg/dL (ref 0.2–1.2)
Total Protein: 7.4 g/dL (ref 6.0–8.3)

## 2024-01-19 LAB — SEDIMENTATION RATE: Sed Rate: 23 mm/h (ref 0–30)

## 2024-01-19 LAB — IBC + FERRITIN
Ferritin: 68.6 ng/mL (ref 10.0–291.0)
Iron: 69 ug/dL (ref 42–145)
Saturation Ratios: 20 % (ref 20.0–50.0)
TIBC: 344.4 ug/dL (ref 250.0–450.0)
Transferrin: 246 mg/dL (ref 212.0–360.0)

## 2024-01-19 LAB — TSH: TSH: 3.83 u[IU]/mL (ref 0.35–5.50)

## 2024-01-19 NOTE — Patient Instructions (Addendum)
 Your provider has requested that you go to the basement level for lab work before leaving today. Press B on the elevator. The lab is located at the first door on the left as you exit the elevator.  Your provider has requested that you have an abdominal x ray before leaving today. Please go to the basement floor to our Radiology department for the test.  VISIT SUMMARY:  Today, you were seen for gastrointestinal symptoms, including severe diarrhea, itching, bloating, and occasional reflux. We discussed your recent health history, including episodes of black stools and your history of iron  deficiency anemia. We reviewed your current medications and lifestyle habits to identify potential causes for your symptoms.  YOUR PLAN:  -ELEVATED LIVER FUNCTION TESTS WITH RECENT ACUTE DIARRHEA: Your liver function tests are elevated, which may be due to an infection. We will run additional blood tests to check for autoimmune causes and recheck your liver function. If the levels remain high, we may consider an MRI.  -CHRONIC DIARRHEA WITH POST-INFECTIOUS IRRITABLE BOWEL SYNDROME FEATURES: Your chronic diarrhea may be related to a previous infection. We recommend stopping Imodium and starting Benefiber to help manage your symptoms. We will also order stool tests and may consider a colonoscopy if the diarrhea returns.  -GASTRITIS WITH GASTRIC WALL THICKENING ON IMAGING: You have inflammation in your stomach lining, which can cause discomfort and other symptoms. We will continue your current medication, Protonix , and may consider an endoscopy if your symptoms or anemia persist.  -IRON  DEFICIENCY ANEMIA WITH HISTORY OF GASTROINTESTINAL BLOOD LOSS: You have a history of low iron  levels, which can cause fatigue and other symptoms. We will order an iron  panel and ferritin test to check your iron  levels and may consider an endoscopy if iron  deficiency is confirmed.  -GASTROESOPHAGEAL REFLUX DISEASE SYMPTOMS: You  experience occasional heartburn and reflux. We will continue your current medication, Protonix , to help manage these symptoms.  INSTRUCTIONS:  Please follow up with the lab tests as ordered. If your symptoms persist or worsen, contact our office. We may need to schedule additional tests, such as an MRI or endoscopy, based on your lab results and symptoms.  Due to recent changes in healthcare laws, you may see the results of your imaging and laboratory studies on MyChart before your provider has had a chance to review them.  We understand that in some cases there may be results that are confusing or concerning to you. Not all laboratory results come back in the same time frame and the provider may be waiting for multiple results in order to interpret others.  Please give us  48 hours in order for your provider to thoroughly review all the results before contacting the office for clarification of your results.    I appreciate the  opportunity to care for you  Thank You   [facility]

## 2024-01-19 NOTE — Progress Notes (Signed)
 01/19/2024 [name] [id number] Aug 12, 1945  Referring provider: [name], MD Primary GI doctor: [name]  ASSESSMENT AND PLAN:  Elevated LFTs  Status post cholecystectomy R factor mixed and now more cholestatic Initially started about a month ago during diarrheal illness, no antibiotics, no OTC meds, no sick contacts, no ETOH.  Has been on januvia  for about a year, has been on simvastatin  a long time wihtout change in dose Negative acute hepatitis panel with PCP Predominantly ALT and alk phos, currently downtrending 12/11/2023 CT abdomen pelvis without contrast showed no acute process areas of underdistention proximal stomach and antrum apparent wall thickening is likely secondary correlate with any symptoms of gastritis, pancreatic atrophy but no duct dilation or acute inflammation normal for age, cholecystectomy without biliary ductal dilation normal liver No jaundice, no swelling in legs/AB, did have some generalized itching    Latest Ref Rng & Units 01/06/2024    8:54 AM 12/14/2023    2:30 PM 12/11/2023    8:51 AM  Hepatic Function  Total Protein 6.0 - 8.5 g/dL 6.5  6.3  6.5    6.3   Albumin 3.8 - 4.8 g/dL 4.2  4.1  4.1    4.2   AST 0 - 40 IU/L 41  48  88    83   ALT 0 - 32 IU/L 56  120  224    209   Alk Phosphatase 44 - 121 IU/L 175  191  184    188   Total Bilirubin 0.0 - 1.2 mg/dL 1.0  0.6  0.7    0.6   Bilirubin, Direct 0.00 - 0.40 mg/dL   9.78   From presentation appears to be more of an infection with diarrheal illness and upper GI symptoms currently downtrending, will rule out autoimmune, consider MRCP -Will repeat hepatic function, thyroid , iron , ferritin, ANA, ASMA AMA -Pending repeat will consider MRCP  Diarrhea initially started May Had black stools 4-5, no pepto, no iron  supplement, has GERD Associated fever, chills, no sick contacts, no ABX negative GI pathogen panel Has been on januvia  for about a year, has been on simvastatin  a long time  wihtout change in dose Had bloating and AB discomfort, the diarrhea was 5-6 x a day, with nocturnal symptoms Imodium has improved symptoms, now having BM every 2-3 days and still taking imodium, soft stools, no melena or blood Possible post infectious diarrhea, possible UGI bleed, SIBO - check KUB - add on fiber - stop imodium and see how bowels are doing currently - consider colonoscopy for microscopic colitis if returns - Can consider empiric treatment Flagyl 250 mg 3 times daily  History of IDA with workup in 2022 unremarkable, now with 4-5 stools patient describes as melenic that have resolved 12/11/2023  HGB 12.6 MCV 96 Platelets 173 04/22/2022 Iron  96 Ferritin 253 B12 313 Recent Labs    02/19/23 0955 05/28/23 1054 11/23/23 1603 12/11/23 0851  HGB 13.9 13.8 13.1 12.6  02/02/2021 EGD large gastric polyp, mild gastritis, empiric esophageal dilation,  negative celiac, non-H. pylori gastritis, hyperplastic polyp 02/06/2021 colonoscopy good bowel prep mild sigmoid diverticulosis, nonbleeding external/internal hemorrhoids normal TI, negative microscopic colitis -Recheck iron , ferritin -If patient has iron  deficiency will plan on EGD with possible VCE  GERD with occasional dysphagia 02/02/2021 EGD large gastric polyp, mild gastritis, empiric esophageal dilation,  negative celiac, non-H. pylori gastritis -Continue pantoprazole  40 mg once daily, Pepcid  at night - Declines EGD at this time though she had some evidence  of gastritis seen on CT and 4-5 episodes of melenic stools, patient does have continuing iron  deficiency we will plan on EGD plus or minus VCE.  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 female with a past medical history listed below presents for evaluation of elevated LFTs, diarrhea and possible melena.   Patient last seen in the office 01/02/2021 by [name] for IDA with hemi positive stools.  Discussed the use of AI scribe software for clinical note transcription with the patient, who gave verbal consent to proceed.  History of Present Illness   Alicia Jordan is a 78 year old female who presents with gastrointestinal symptoms.  She has been experiencing severe diarrhea since mid-May, with episodes occurring five to six times daily, including nocturnal episodes. The diarrhea is described as loose and 'really, really bad'. She has been using Imodium after each bowel movement, now approximately every other day, but her bowel movements remain soft and occur every two to three days.  Prior to the onset of diarrhea, she experienced four to five episodes of black, possibly tarry stools around May. She denies taking Pepto Bismol or iron  supplements at that time. She has a history of iron  deficiency anemia diagnosed in 2022 but is not currently on iron  supplements.  She reports generalized itching, which she attributes to allergies, and denies any yellowing of the eyes or skin. She also experiences bloating and stomach growling. No nausea, vomiting, or significant changes in her medications, which include simvastatin  since 2020, Januvia  for about a year, and Farxiga  since 2021.  She denies any alcohol or drug use and has no family history of liver issues. She has not been on antibiotics in the past six months and reports no recent illnesses in her household. She has a history of gallbladder removal.  She reports occasional reflux and heartburn, and  sometimes experiences difficulty swallowing. She has a history of hemorrhoids but denies current rectal bleeding.      She  reports that she has never smoked. She has never been exposed to tobacco smoke. She has never used smokeless tobacco. She reports that she does not drink alcohol and does not use drugs.  RELEVANT GI HISTORY, IMAGING AND LABS: Results   LABS Liver function tests: 224 (12/14/2023) Liver function tests: 188 (01/06/2024) Hepatitis A, B, C panels: Negative  RADIOLOGY CT scan: Pancreatic atrophy, gastric wall thickening, gastritis      CBC    Component Value Date/Time   WBC 5.8 12/11/2023 0851   RBC 4.38 12/11/2023 0851   RBC 4.84 02/01/2021 0000   HGB 12.6 12/11/2023 0851   HCT 42.0 12/11/2023 0851   PLT 173 12/11/2023 0851   MCV 96 12/11/2023 0851   MCH 28.8 12/11/2023 0851   MCHC 30.0 (L) 12/11/2023 0851   RDW 13.4 12/11/2023 0851   LYMPHSABS 1.1 12/11/2023 0851   EOSABS 0.1 12/11/2023 0851   BASOSABS 0.0 12/11/2023 0851   Recent Labs    02/19/23 0955 05/28/23 1054 11/23/23 1603 12/11/23 0851  HGB 13.9 13.8 13.1 12.6  CMP     Component Value Date/Time   NA 141 01/06/2024 0854   K 5.1 01/06/2024 0854   CL 103 01/06/2024 0854   CO2 24 01/06/2024 0854   GLUCOSE 147 (H) 01/06/2024 0854   BUN 23 01/06/2024 0854   CREATININE 1.53 (H) 01/06/2024 0854   CALCIUM  9.4 01/06/2024 0854   PROT 6.5 01/06/2024 0854   ALBUMIN 4.2 01/06/2024 0854   AST 41 (H) 01/06/2024 0854   ALT 56 (H) 01/06/2024 0854   ALKPHOS 175 (H) 01/06/2024 0854   BILITOT 1.0 01/06/2024 0854   GFRNONAA 41 (L) 05/04/2020 0935   GFRAA 47 (L) 05/04/2020 0935      Latest Ref Rng & Units 01/06/2024    8:54 AM 12/14/2023    2:30 PM 12/11/2023    8:51 AM  Hepatic Function  Total Protein 6.0 - 8.5 g/dL 6.5  6.3  6.5    6.3   Albumin 3.8 - 4.8 g/dL 4.2  4.1  4.1    4.2   AST 0 - 40 IU/L 41  48  88    83   ALT 0 - 32 IU/L 56  120  224    209   Alk Phosphatase 44 - 121 IU/L 175   191  184    188   Total Bilirubin 0.0 - 1.2 mg/dL 1.0  0.6  0.7    0.6   Bilirubin, Direct 0.00 - 0.40 mg/dL   9.78       Current Medications:   Current Outpatient Medications (Endocrine & Metabolic):    dapagliflozin  propanediol (FARXIGA ) 10 MG TABS tablet, Take 1 tablet (10 mg) by mouth daily before breakfast.   sitaGLIPtin  (JANUVIA ) 50 MG tablet, Take 1 tablet by mouth once daily  Current Outpatient Medications (Cardiovascular):    ezetimibe  (ZETIA ) 10 MG tablet, Take 1 tablet (10 mg) by mouth daily.   losartan  (COZAAR ) 100 MG tablet, Take 1 tablet (100 mg total) by mouth every evening.   omega-3 acid ethyl esters (LOVAZA ) 1 g capsule, Take 2 capsules (2 g total) by mouth 2 (two) times daily.   simvastatin  (ZOCOR ) 40 MG tablet, TAKE 1 TABLET BY MOUTH AT BEDTIME  Current Outpatient Medications (Respiratory):    albuterol  (VENTOLIN  HFA) 108 (90 Base) MCG/ACT inhaler, Inhale 2 puffs into the lungs every 4 (four) hours as needed for wheezing or shortness of breath.   azelastine  (ASTELIN ) 0.1 % nasal spray, Place 2 sprays into both nostrils 2 (two) times daily as needed for rhinitis. Use in each nostril as directed   cetirizine  (ZYRTEC ) 10 MG tablet, Take 1 tablet (10 mg total) by mouth daily.   ipratropium (ATROVENT ) 0.03 % nasal spray, Place 1 spray into both nostrils 3 (three) times daily as needed for rhinitis (runny nose/drainage. use less if becomes too dry.).   montelukast  (SINGULAIR ) 10 MG tablet, Take 1 tablet (10 mg total) by mouth at bedtime.   sodium chloride  (OCEAN) 0.65 % SOLN nasal spray, Place 1 spray into both nostrils as needed for congestion. Use prior to medicated nasal sprays   albuterol  (VENTOLIN  HFA) 108 (90 Base) MCG/ACT inhaler, INHALE 2 PUFFS BY MOUTH EVERY 6 HOURS AS NEEDED FOR WHEEZING FOR SHORTNESS OF BREATH  Current Outpatient Medications (Analgesics):    Acetaminophen (TYLENOL PO), Take by mouth as needed.  Current Outpatient Medications (Hematological):     vitamin B-12 (CYANOCOBALAMIN ) 100 MCG tablet, Take 100 mcg by mouth daily.  Current Outpatient Medications (Other):    cholecalciferol  (VITAMIN  D3) 25 MCG (1000 UNIT) tablet, Take 1 tablet (1,000 Units total) by mouth daily.   famotidine  (PEPCID ) 20 MG tablet, Take 1 tablet (20 mg) by mouth at bedtime.   FLUoxetine  (PROZAC ) 20 MG capsule, Take 2 capsules (40 mg) by mouth daily.   glucose blood test strip, 1 each by Other route 2 (two) times daily. Use as instructed   ondansetron  (ZOFRAN ) 4 MG tablet, Take 1 tablet (4 mg total) by mouth every 8 (eight) hours as needed for nausea or vomiting.   pantoprazole  (PROTONIX ) 40 MG tablet, Take 1 tablet (40 mg total) by mouth 2 (two) times daily.   Vitamin D , Ergocalciferol , (DRISDOL ) 1.25 MG (50000 UNIT) CAPS capsule, Take 1 capsule (50,000 Units total) by mouth every 7 (seven) days on Saturday.  Medical History:  Past Medical History:  Diagnosis Date   Allergy     Anemia    Anxiety    Arthralgia of left temporomandibular joint    Arthritis    Cervicalgia    Chronic kidney disease, stage 3a (HCC)    Depression    Diabetes mellitus without complication (HCC)    Hesitancy of micturition    Hyperlipidemia    Hypertension    Osteoporosis    Other psoriasis    Other specified menopausal and perimenopausal disorders    Overweight    Repeated falls    Residual hemorrhoidal skin tags    Allergies:  Allergies  Allergen Reactions   Meloxicam Other (See Comments)   Rosuvastatin      Muscle pain   Semaglutide      Nausea, confusion, and blurred vision     Surgical History:  She  has a past surgical history that includes Colonoscopy (04/09/2016); Abdominal hysterectomy; Cholecystectomy; Knee surgery (Left); eye lid surgery; Carpal tunnel release (Right); Appendectomy; Esophagogastroduodenoscopy (09/10/2012); Replacement total knee (Right, 06/03/2021); Cataract extraction w/ intraocular lens implant (Left, 04/01/2023); and Cataract extraction  w/ intraocular lens implant (Right, 03/18/2023). Family History:  Her family history includes Cancer in her daughter; Colon cancer (age of onset: 69) in her daughter; Dementia in her mother; Diabetes type II in her father and mother; Heart attack in her father; Hyperlipidemia in her father and mother; Hypertension in her father and mother; Stroke in her father.  REVIEW OF SYSTEMS  : All other systems reviewed and negative except where noted in the History of Present Illness.  PHYSICAL EXAM: BP 120/60   Pulse 66   Ht 5' 1 (1.549 m)   Wt 150 lb (68 kg)   BMI 28.34 kg/m  Physical Exam   GENERAL APPEARANCE: Well nourished, in no apparent distress. HEENT: No cervical lymphadenopathy, unremarkable thyroid , sclerae anicteric, conjunctiva pink. RESPIRATORY: Respiratory effort normal, breath sounds equal bilaterally without rales, rhonchi, or wheezing. CARDIO: Regular rate and rhythm with no murmurs, rubs, or gallops, peripheral pulses intact. ABDOMEN: Soft, non-distended, active bowel sounds in all four quadrants, epigastric tenderness, right lower quadrant tenderness, no rebound, no mass appreciated. RECTAL: Declines. MUSCULOSKELETAL: Full range of motion, normal gait, without edema. SKIN: Dry, intact without rashes or lesions. No jaundice. NEURO: Alert, oriented, no focal deficits. PSYCH: Cooperative, normal mood and affect.      Alan JONELLE Coombs, PA-C 9:57 AM

## 2024-01-20 DIAGNOSIS — R197 Diarrhea, unspecified: Secondary | ICD-10-CM | POA: Diagnosis not present

## 2024-01-20 DIAGNOSIS — R7989 Other specified abnormal findings of blood chemistry: Secondary | ICD-10-CM | POA: Diagnosis not present

## 2024-01-20 NOTE — Progress Notes (Signed)
   01/20/2024  Patient ID: Alicia Jordan, female   DOB: [birth year], 78 y.o.   MRN: [id number]  Pharmacy Quality Measure Review  This patient is appearing on a report for being at risk of failing the adherence measure for cholesterol (statin) medications this calendar year.   Medication: simvastatin   Last fill date: 12/27/23 for 90 day supply  Insurance report was not up to date. No action needed at this time.   Lang Sieve, PharmD, BCGP Clinical Pharmacist  [phone]

## 2024-01-22 LAB — IGG: IgG (Immunoglobin G), Serum: 869 mg/dL (ref 600–1540)

## 2024-01-22 LAB — ANA: Anti Nuclear Antibody (ANA): POSITIVE — AB

## 2024-01-22 LAB — ANTI-SMOOTH MUSCLE ANTIBODY, IGG: Actin (Smooth Muscle) Antibody (IGG): <20 U (ref <20)

## 2024-01-22 LAB — ANTI-NUCLEAR AB-TITER (ANA TITER)
ANA TITER: 1:80 {titer} — ABNORMAL HIGH
ANA Titer 1: 1:40 {titer} — ABNORMAL HIGH

## 2024-01-22 LAB — MITOCHONDRIAL ANTIBODIES: Mitochondrial M2 Ab, IgG: <=20 U (ref <=20.0)

## 2024-01-23 ENCOUNTER — Other Ambulatory Visit (HOSPITAL_COMMUNITY): Payer: Self-pay

## 2024-01-23 LAB — FECAL FAT, QUALITATIVE
Fat Qual Neutral, Stl: NORMAL
Fat Qual Total, Stl: NORMAL

## 2024-01-26 ENCOUNTER — Telehealth: Payer: Self-pay

## 2024-01-26 MED ORDER — VITAMIN D (ERGOCALCIFEROL) 1.25 MG (50000 UNIT) PO CAPS: 50000.0000 [IU] | ORAL_CAPSULE | ORAL | 1 refills | Status: DC

## 2024-01-26 NOTE — Telephone Encounter (Signed)
 Copied from CRM [id number]. Topic: Clinical - Medication Question >> Jan 26, 2024  2:47 PM Gustabo D wrote: Patient has no refills on it and needs a new prescription -Vitamin D , Ergocalciferol , (DRISDOL ) 1.25 MG (50000 UNIT) CAPS capsule - Wants it sent to [facility] [address]

## 2024-01-27 DIAGNOSIS — E1122 Type 2 diabetes mellitus with diabetic chronic kidney disease: Secondary | ICD-10-CM | POA: Diagnosis not present

## 2024-01-27 DIAGNOSIS — N1832 Chronic kidney disease, stage 3b: Secondary | ICD-10-CM | POA: Diagnosis not present

## 2024-01-27 DIAGNOSIS — I129 Hypertensive chronic kidney disease with stage 1 through stage 4 chronic kidney disease, or unspecified chronic kidney disease: Secondary | ICD-10-CM | POA: Diagnosis not present

## 2024-01-27 MED ORDER — METRONIDAZOLE 250 MG PO TABS: 250.0000 mg | ORAL_TABLET | Freq: Three times a day (TID) | ORAL | 0 refills | Status: AC

## 2024-01-28 LAB — PANCREATIC ELASTASE, FECAL: Pancreatic Elastase-1, Stool: 490 ug/g (ref >200)

## 2024-02-04 ENCOUNTER — Other Ambulatory Visit: Payer: Self-pay

## 2024-02-04 ENCOUNTER — Other Ambulatory Visit (HOSPITAL_COMMUNITY): Payer: Self-pay

## 2024-02-04 DIAGNOSIS — E1121 Type 2 diabetes mellitus with diabetic nephropathy: Secondary | ICD-10-CM

## 2024-02-04 MED ORDER — SITAGLIPTIN PHOSPHATE 50 MG PO TABS
50.0000 mg | ORAL_TABLET | Freq: Every day | ORAL | 0 refills | Status: DC
  Filled 2024-02-04: qty 90, 90d supply, fill #0

## 2024-02-09 ENCOUNTER — Telehealth: Payer: Self-pay

## 2024-02-09 NOTE — Telephone Encounter (Signed)
 Patient spouse called to state that our office will receive a call from [facility] requesting a rx for Januvia  for patient and wanted us  to be aware. [facility] is a program he has found to assist with the cost of Januvia .

## 2024-02-16 NOTE — Progress Notes (Unsigned)
 02/17/2024 Alicia Jordan [id number] 1945-10-15  Referring provider: [name], MD Primary GI doctor: [name]  ASSESSMENT AND PLAN:  Elevated LFTs  during diarrheal illness, resolved  Status post cholecystectomy Negative acute hepatitis panel with PCP Predominantly ALT and alk phos, currently downtrending 12/11/2023 CT abdomen pelvis without contrast showed no acute process areas of underdistention proximal stomach and antrum apparent wall thickening is likely secondary correlate with any symptoms of gastritis, pancreatic atrophy but no duct dilation or acute inflammation normal for age, cholecystectomy without biliary ductal dilation normal liver    Latest Ref Rng & Units 01/19/2024    9:58 AM 01/06/2024    8:54 AM 12/14/2023    2:30 PM  Hepatic Function  Total Protein 6.0 - 8.3 g/dL 7.4  6.5  6.3   Albumin 3.5 - 5.2 g/dL 4.5  4.2  4.1   AST 0 - 37 U/L 22  41  48   ALT 0 - 35 U/L 21  56  120   Alk Phosphatase 39 - 117 U/L 92  175  191   Total Bilirubin 0.2 - 1.2 mg/dL 1.0  1.0  0.6   Bilirubin, Direct 0.0 - 0.3 mg/dL 0.2     From presentation appears to be more of an infection with diarrheal illness and upper GI symptoms currently resolved - Hepatic function normal, IgG, AMA, ASMA negative, ANA titer positive but weakly positive - Overall hepatocellular workup negative LFTs resolved, if LFTs return elevated consider MRCP.  Diarrhea initially started May has improved Bm's 2 a day now, still mushy stools, occ mucus/yellow fecal incontinence, and LLQ pain Likely postinfectious diarrhea versus SIBO negative GI pathogen panel 01/19/2024 KUB showed moderate colonic stool burden no bowel obstruction Trial of Flagyl  sent 01/19/2024, caused cramping, did not help - check KUB - add on fiber, avoid milk, FODMAP given - consider colonoscopy for microscopic colitis if returns - will follow up as needed  History of IDA with workup in 2022 unremarkable, no current anemia/IDA 01/19/2024   HGB 13.1 MCV 87.9 Platelets 155.0 01/19/2024 Iron  69 Ferritin 68.6 B12 313 Recent Labs    02/19/23 0955 05/28/23 1054 11/23/23 1603 12/11/23 0851 01/19/24 0958  HGB 13.9 13.8 13.1 12.6 13.1   02/02/2021 EGD large gastric polyp, mild gastritis, empiric esophageal dilation,  negative celiac, non-H. pylori gastritis, hyperplastic polyp 02/06/2021 colonoscopy good bowel prep mild sigmoid diverticulosis, nonbleeding external/internal hemorrhoids normal TI, negative microscopic colitis No iron  deficiency anemia last OV If returns consider EGD and/or VCE  GERD with  dysphagia to liquids, not with solids 02/02/2021 EGD large gastric polyp, mild gastritis, empiric esophageal dilation,  negative celiac, non-H. pylori gastritis -Continue pantoprazole  40 mg once daily, Pepcid  at night - consider barium swallow - Consider EGD - declines evaluation at this time, states not bothering her enough - given dysmotility diet, will call if any worsening symptoms with plan for barium swallow  Family history of colon cancer (Daughter at age 75) 02/06/2021 colonoscopy good bowel prep mild sigmoid diverticulosis, nonbleeding external/internal hemorrhoids normal TI, negative microscopic colitis No recall due to age  Type 2 diabetes with CKD stage III  Patient Care Team: [name], MD as PCP - General (Family Medicine) Charlanne Groom, MD as Consulting Physician (Gastroenterology) Larnell Purchase, MD as Referring Physician (Orthopedic Surgery) Rubie Kemps, MD as Consulting Physician (Orthopedic Surgery)  HISTORY OF PRESENT ILLNESS: 78 y.o. female with a past medical history listed below presents for evaluation of elevated LFTs, diarrhea and possible melena.  Patient last seen in the office 01/19/2024 by myself for elevated LFTs diarrhea and melena.  Discussed the use of AI scribe software for clinical note transcription with the patient, who gave verbal consent to proceed.  History of Present Illness    Alicia Jordan is a 78 year old female who presents with gastrointestinal symptoms including diarrhea and swallowing difficulties.  She has been experiencing diarrhea and dark stools since August 5th. Initial workup included a negative GI pathogen test and an x-ray showing moderate stool burden without obstruction. She was prescribed metronidazole , which did not help and caused irritability. She currently reports improvement with bowel movements reduced to two mushy stools per day, down from five to six nocturnal episodes. No current nocturnal episodes or use of Imodium.  She describes occasional yellow discharge from her rectum, which she notices on her underwear. She also reports persistent left-sided abdominal pain described as discomfort rather than sharp or cramping, and it does not radiate. The pain is not associated with bowel movements or eating and is described as intermittent and short-lived. She also experiences bloating and gas.  She reports episodes of choking while drinking liquids, which she describes as 'getting strangled.' No recent dark stools, nausea, vomiting, fevers, or chills. Her liver function tests were previously elevated but have since normalized, and autoimmune workup was negative.  Her husband mentions that their daughter was recently sick with fever and vomiting, but it is not COVID or strep. School started last week, which may have contributed to the illness.      She  reports that she has never smoked. She has never been exposed to tobacco smoke. She has never used smokeless tobacco. She reports that she does not drink alcohol and does not use drugs.  RELEVANT GI HISTORY, IMAGING AND LABS: Results   LABS GI pathogen panel: Negative (01/19/2024) Liver function tests: Normal Autoimmune panel: Negative  RADIOLOGY Abdominal x-ray: Moderate stool burden, no obstruction (01/19/2024)      CBC    Component Value Date/Time   WBC 5.4 01/19/2024 0958   RBC 4.55  01/19/2024 0958   HGB 13.1 01/19/2024 0958   HGB 12.6 12/11/2023 0851   HCT 40.0 01/19/2024 0958   HCT 42.0 12/11/2023 0851   PLT 155.0 01/19/2024 0958   PLT 173 12/11/2023 0851   MCV 87.9 01/19/2024 0958   MCV 96 12/11/2023 0851   MCH 28.8 12/11/2023 0851   MCHC 32.7 01/19/2024 0958   RDW 15.2 01/19/2024 0958   RDW 13.4 12/11/2023 0851   LYMPHSABS 1.7 01/19/2024 0958   LYMPHSABS 1.1 12/11/2023 0851   MONOABS 0.3 01/19/2024 0958   EOSABS 0.2 01/19/2024 0958   EOSABS 0.1 12/11/2023 0851   BASOSABS 0.0 01/19/2024 0958   BASOSABS 0.0 12/11/2023 0851   Recent Labs    02/19/23 0955 05/28/23 1054 11/23/23 1603 12/11/23 0851 01/19/24 0958  HGB 13.9 13.8 13.1 12.6 13.1    CMP     Component Value Date/Time   NA 141 01/06/2024 0854   K 5.1 01/06/2024 0854   CL 103 01/06/2024 0854   CO2 24 01/06/2024 0854   GLUCOSE 147 (H) 01/06/2024 0854   BUN 23 01/06/2024 0854   CREATININE 1.53 (H) 01/06/2024 0854   CALCIUM  9.4 01/06/2024 0854   PROT 7.4 01/19/2024 0958   PROT 6.5 01/06/2024 0854   ALBUMIN 4.5 01/19/2024 0958   ALBUMIN 4.2 01/06/2024 0854   AST 22 01/19/2024 0958   ALT 21 01/19/2024 0958   ALKPHOS 92 01/19/2024  0958   BILITOT 1.0 01/19/2024 0958   BILITOT 1.0 01/06/2024 0854   GFRNONAA 41 (L) 05/04/2020 0935   GFRAA 47 (L) 05/04/2020 0935      Latest Ref Rng & Units 01/19/2024    9:58 AM 01/06/2024    8:54 AM 12/14/2023    2:30 PM  Hepatic Function  Total Protein 6.0 - 8.3 g/dL 7.4  6.5  6.3   Albumin 3.5 - 5.2 g/dL 4.5  4.2  4.1   AST 0 - 37 U/L 22  41  48   ALT 0 - 35 U/L 21  56  120   Alk Phosphatase 39 - 117 U/L 92  175  191   Total Bilirubin 0.2 - 1.2 mg/dL 1.0  1.0  0.6   Bilirubin, Direct 0.0 - 0.3 mg/dL 0.2         Current Medications:   Current Outpatient Medications (Endocrine & Metabolic):    dapagliflozin  propanediol (FARXIGA ) 10 MG TABS tablet, Take 1 tablet (10 mg) by mouth daily before breakfast.   sitaGLIPtin  (JANUVIA ) 50 MG tablet, Take 1  tablet by mouth once daily  Current Outpatient Medications (Cardiovascular):    ezetimibe  (ZETIA ) 10 MG tablet, Take 1 tablet (10 mg) by mouth daily.   losartan  (COZAAR ) 100 MG tablet, Take 1 tablet (100 mg total) by mouth every evening.   omega-3 acid ethyl esters (LOVAZA ) 1 g capsule, Take 2 capsules (2 g total) by mouth 2 (two) times daily.   simvastatin  (ZOCOR ) 40 MG tablet, TAKE 1 TABLET BY MOUTH AT BEDTIME  Current Outpatient Medications (Respiratory):    albuterol  (VENTOLIN  HFA) 108 (90 Base) MCG/ACT inhaler, INHALE 2 PUFFS BY MOUTH EVERY 6 HOURS AS NEEDED FOR WHEEZING FOR SHORTNESS OF BREATH   albuterol  (VENTOLIN  HFA) 108 (90 Base) MCG/ACT inhaler, Inhale 2 puffs into the lungs every 4 (four) hours as needed for wheezing or shortness of breath.   azelastine  (ASTELIN ) 0.1 % nasal spray, Place 2 sprays into both nostrils 2 (two) times daily as needed for rhinitis. Use in each nostril as directed   cetirizine  (ZYRTEC ) 10 MG tablet, Take 1 tablet (10 mg total) by mouth daily.   ipratropium (ATROVENT ) 0.03 % nasal spray, Place 1 spray into both nostrils 3 (three) times daily as needed for rhinitis (runny nose/drainage. use less if becomes too dry.).   montelukast  (SINGULAIR ) 10 MG tablet, Take 1 tablet (10 mg total) by mouth at bedtime.   sodium chloride  (OCEAN) 0.65 % SOLN nasal spray, Place 1 spray into both nostrils as needed for congestion. Use prior to medicated nasal sprays  Current Outpatient Medications (Analgesics):    Acetaminophen (TYLENOL PO), Take by mouth as needed.  Current Outpatient Medications (Hematological):    vitamin B-12 (CYANOCOBALAMIN ) 100 MCG tablet, Take 100 mcg by mouth daily.  Current Outpatient Medications (Other):    cholecalciferol  (VITAMIN D3) 25 MCG (1000 UNIT) tablet, Take 1 tablet (1,000 Units total) by mouth daily.   famotidine  (PEPCID ) 20 MG tablet, Take 1 tablet (20 mg) by mouth at bedtime.   FLUoxetine  (PROZAC ) 20 MG capsule, Take 2 capsules (40  mg) by mouth daily.   glucose blood test strip, 1 each by Other route 2 (two) times daily. Use as instructed   ondansetron  (ZOFRAN ) 4 MG tablet, Take 1 tablet (4 mg total) by mouth every 8 (eight) hours as needed for nausea or vomiting.   pantoprazole  (PROTONIX ) 40 MG tablet, Take 1 tablet (40 mg total) by mouth 2 (two) times daily.  Vitamin D , Ergocalciferol , (DRISDOL ) 1.25 MG (50000 UNIT) CAPS capsule, Take 1 capsule (50,000 Units total) by mouth every 7 (seven) days on Saturday.  Medical History:  Past Medical History:  Diagnosis Date   Allergy     Anemia    Anxiety    Arthralgia of left temporomandibular joint    Arthritis    Cervicalgia    Chronic kidney disease, stage 3a (HCC)    Depression    Diabetes mellitus without complication (HCC)    Hesitancy of micturition    Hyperlipidemia    Hypertension    Osteoporosis    Other psoriasis    Other specified menopausal and perimenopausal disorders    Overweight    Repeated falls    Residual hemorrhoidal skin tags    Allergies:  Allergies  Allergen Reactions   Meloxicam Other (See Comments)   Rosuvastatin      Muscle pain   Semaglutide      Nausea, confusion, and blurred vision     Surgical History:  She  has a past surgical history that includes Colonoscopy (04/09/2016); Abdominal hysterectomy; Cholecystectomy; Knee surgery (Left); eye lid surgery; Carpal tunnel release (Right); Appendectomy; Esophagogastroduodenoscopy (09/10/2012); Replacement total knee (Right, 06/03/2021); Cataract extraction w/ intraocular lens implant (Left, 04/01/2023); and Cataract extraction w/ intraocular lens implant (Right, 03/18/2023). Family History:  Her family history includes Cancer in her daughter; Colon cancer (age of onset: 30) in her daughter; Dementia in her mother; Diabetes type II in her father and mother; Heart attack in her father; Hyperlipidemia in her father and mother; Hypertension in her father and mother; Stroke in her  father.  REVIEW OF SYSTEMS  : All other systems reviewed and negative except where noted in the History of Present Illness.  PHYSICAL EXAM: BP (!) 140/66 (BP Location: Left Arm, Patient Position: Sitting, Cuff Size: Normal)   Pulse 68   Ht 5' 1 (1.549 m)   Wt 148 lb (67.1 kg)   BMI 27.96 kg/m  Physical Exam   GENERAL APPEARANCE: Well nourished, in no apparent distress. HEENT: No cervical lymphadenopathy, unremarkable thyroid , sclerae anicteric, conjunctiva pink. RESPIRATORY: Respiratory effort normal, breath sounds equal bilaterally without rales, rhonchi, or wheezing. CARDIO: Regular rate and rhythm with no murmurs, rubs, or gallops, peripheral pulses intact. ABDOMEN: Soft, non-distended, active bowel sounds in all four quadrants, left lower abdomen tender to palpation, no rebound tenderness, no mass appreciated. RECTAL: Declines. MUSCULOSKELETAL: Full range of motion, normal gait, without edema. SKIN: Dry, intact without rashes or lesions. No jaundice. NEURO: Alert, oriented, no focal deficits. PSYCH: Cooperative, normal mood and affect.      Alan JONELLE Coombs, PA-C 10:25 AM

## 2024-02-17 ENCOUNTER — Encounter: Payer: Self-pay | Admitting: Physician Assistant

## 2024-02-17 ENCOUNTER — Ambulatory Visit (INDEPENDENT_AMBULATORY_CARE_PROVIDER_SITE_OTHER)
Admission: RE | Admit: 2024-02-17 | Discharge: 2024-02-17 | Disposition: A | Discharge status: Home / Self Care | Source: Ambulatory Visit | Attending: Physician Assistant | Admitting: Physician Assistant

## 2024-02-17 ENCOUNTER — Ambulatory Visit: Admitting: Physician Assistant

## 2024-02-17 VITALS — BP 140/66 | HR 68 | Ht 61.0 in | Wt 148.0 lb

## 2024-02-17 DIAGNOSIS — K219 Gastro-esophageal reflux disease without esophagitis: Secondary | ICD-10-CM | POA: Diagnosis not present

## 2024-02-17 DIAGNOSIS — R195 Other fecal abnormalities: Secondary | ICD-10-CM

## 2024-02-17 DIAGNOSIS — R1310 Dysphagia, unspecified: Secondary | ICD-10-CM

## 2024-02-17 DIAGNOSIS — R140 Abdominal distension (gaseous): Secondary | ICD-10-CM | POA: Diagnosis not present

## 2024-02-17 DIAGNOSIS — R109 Unspecified abdominal pain: Secondary | ICD-10-CM | POA: Diagnosis not present

## 2024-02-17 DIAGNOSIS — R197 Diarrhea, unspecified: Secondary | ICD-10-CM | POA: Diagnosis not present

## 2024-02-17 DIAGNOSIS — Z9049 Acquired absence of other specified parts of digestive tract: Secondary | ICD-10-CM

## 2024-02-17 DIAGNOSIS — R1319 Other dysphagia: Secondary | ICD-10-CM

## 2024-02-17 DIAGNOSIS — R7989 Other specified abnormal findings of blood chemistry: Secondary | ICD-10-CM

## 2024-02-17 DIAGNOSIS — Z800 Family history of malignant neoplasm of digestive organs: Secondary | ICD-10-CM

## 2024-02-17 DIAGNOSIS — D509 Iron deficiency anemia, unspecified: Secondary | ICD-10-CM

## 2024-02-17 NOTE — Patient Instructions (Addendum)
 Your provider has requested that you have an abdominal x ray before leaving today. Please go to the basement floor to our Radiology department for the test.  FIBER SUPPLEMENT You can do metamucil or fibercon once or twice a day but if this causes gas/bloating please switch to Benefiber or Citracel.  Fiber is good for constipation/diarrhea/irritable bowel syndrome.  It can also help with weight loss and can help lower your bad cholesterol (LDL).  Please do 1 TBSP in the morning in water, coffee, or tea.  It can take up to a month before you can see a difference with your bowel movements.  It is cheapest from [employer], [employer], [employer].   Behavioral and Dietary Strategies for Management of Esophageal Dysmotility/dysphagia 1. Take reflux medications 30+ minutes before food in the morning.  2. Begin meals with warm beverage 3. Eat smaller more frequent meals 4. Eat slowly, taking small bites and sips 5. Alternate solids and liquids 6. Avoid foods/liquids that increase acid production 7. Sit upright during and for 30+ minutes after meals to facilitate esophageal clearing 8. Can try altoid melting in mouth before food  You may have POST INFECTIOUS IBS OR IRRITABLE BOWEL After an infection or diverticulitis flare your intestines can spasm or be a little bit more sensitive. Try these things below:  Can do BRAT diet versus low FODMAP- see below Try trial off milk/lactose products.  Add fiber like benefiber or citracel once a day Can do trial of IBGard for AB pain EVERY DAY- Take 1-2 capsules once a day for maintence or twice a day during a flare Can take dicyclomine as needed.  if any worsening symptoms like blood in stool, weight loss, please call the office or go to the ER.    FODMAP stands for fermentable oligo-, di-, mono-saccharides and polyols (1). These are the scientific terms used to classify groups of carbs that are notorious for triggering digestive symptoms like bloating, gas and  stomach pain.    Here some information about pelvic floor dysfunction. This may be contributing to some of your symptoms. We will continue with our evaluation but I do want you to consider adding on fiber supplement with low-dose MiraLAX daily. We could also refer to pelvic floor physical therapy.   Pelvic Floor Dysfunction, Female Pelvic floor dysfunction (PFD) is a condition that results when the group of muscles and connective tissues that support the organs in the pelvis (pelvic floor muscles) do not work well. These muscles and their connections form a sling that supports the colon and bladder. In women, they also support the uterus. PFD causes pelvic floor muscles to be too weak, too tight, or both. In PFD, muscle movements are not coordinated. This may cause bowel or bladder problems. It may also cause pain. What are the causes? This condition may be caused by an injury to the pelvic area or by a weakening of pelvic muscles. This often results from pregnancy and childbirth or other types of strain. In many cases, the exact cause is not known. What increases the risk? The following factors may make you more likely to develop this condition: Having chronic bladder tissue inflammation (interstitial cystitis). Being an older person. Being overweight. History of radiation treatment for cancer in the pelvic region. Previous pelvic surgery, such as removal of the uterus (hysterectomy). What are the signs or symptoms? Symptoms of this condition vary and may include: Bladder symptoms, such as: Trouble starting urination and emptying the bladder. Frequent urinary tract infections. Leaking urine  when coughing, laughing, or exercising (stress incontinence). Having to pass urine urgently or frequently. Pain when passing urine. Bowel symptoms, such as: Constipation. Urgent or frequent bowel movements. Incomplete bowel movements. Painful bowel movements. Leaking stool or gas. Unexplained  genital or rectal pain. Genital or rectal muscle spasms. Low back pain. Other symptoms may include: A heavy, full, or aching feeling in the vagina. A bulge that protrudes into the vagina. Pain during or after sex. How is this diagnosed? This condition may be diagnosed based on: Your symptoms and medical history. A physical exam. During the exam, your health care provider may check your pelvic muscles for tightness, spasm, pain, or weakness. This may include a rectal exam and a pelvic exam. In some cases, you may have diagnostic tests, such as: Electrical muscle function tests. Urine flow testing. X-ray tests of bowel function. Ultrasound of the pelvic organs. How is this treated? Treatment for this condition depends on the symptoms. Treatment options include: Physical therapy. This may include Kegel exercises to help relax or strengthen the pelvic floor muscles. Biofeedback. This type of therapy provides feedback on how tight your pelvic floor muscles are so that you can learn to control them. Internal or external massage therapy. A treatment that involves electrical stimulation of the pelvic floor muscles to help control pain (transcutaneous electrical nerve stimulation, or TENS). Sound wave therapy (ultrasound) to reduce muscle spasms. Medicines, such as: Muscle relaxants. Bladder control medicines. Surgery to reconstruct or support pelvic floor muscles may be an option if other treatments do not help. Follow these instructions at home: Activity Do your usual activities as told by your health care provider. Ask your health care provider if you should modify any activities. Do pelvic floor strengthening or relaxing exercises at home as told by your physical therapist. Lifestyle Maintain a healthy weight. Eat foods that are high in fiber, such as beans, whole grains, and fresh fruits and vegetables. Limit foods that are high in fat and processed sugars, such as fried or sweet  foods. Manage stress with relaxation techniques such as yoga or meditation. General instructions If you have problems with leakage: Use absorbable pads or wear padded underwear. Wash frequently with mild soap. Keep your genital and anal area as clean and dry as possible. Ask your health care provider if you should try a barrier cream to prevent skin irritation. Take warm baths to relieve pelvic muscle tension or spasms. Take over-the-counter and prescription medicines only as told by your health care provider. Keep all follow-up visits. How is this prevented? The cause of PFD is not always known, but there are a few things you can do to reduce the risk of developing this condition, including: Staying at a healthy weight. Getting regular exercise. Managing stress. Contact a health care provider if: Your symptoms are not improving with home care. You have signs or symptoms of PFD that get worse at home. You develop new signs or symptoms. You have signs of a urinary tract infection, such as: Fever. Chills. Increased urinary frequency. A burning feeling when urinating. You have not had a bowel movement in 3 days (constipation). Summary Pelvic floor dysfunction results when the muscles and connective tissues in your pelvic floor do not work well. These muscles and their connections form a sling that supports your colon and bladder. In women, they also support the uterus. PFD may be caused by an injury to the pelvic area or by a weakening of pelvic muscles. PFD causes pelvic floor muscles to be  too weak, too tight, or a combination of both. Symptoms may vary from person to person. In most cases, PFD can be treated with physical therapies and medicines. Surgery may be an option if other treatments do not help. This information is not intended to replace advice given to you by your health care provider. Make sure you discuss any questions you have with your health care provider. Document  Revised: 10/10/2020 Document Reviewed: 10/10/2020 Elsevier Patient Education  2022 [employer].

## 2024-02-26 ENCOUNTER — Ambulatory Visit: Payer: Self-pay | Admitting: Physician Assistant

## 2024-03-02 NOTE — Progress Notes (Signed)
   03/02/2024  Patient ID: Alicia Jordan, female   DOB: June 25, 1945, 78 y.o.   MRN: [id number]  Pharmacy Quality Measure Review  This patient is appearing on a report for being at risk of failing the adherence measure for diabetes medications this calendar year.   Medication: Januvia  50mg  Last fill date: 02/05/2024 for 90 day supply  Insurance report was not up to date. No action needed at this time.   Lang Sieve, PharmD, BCGP Clinical Pharmacist  [phone]

## 2024-03-09 ENCOUNTER — Other Ambulatory Visit: Payer: Self-pay | Admitting: Family Medicine

## 2024-03-09 ENCOUNTER — Other Ambulatory Visit: Payer: Self-pay

## 2024-03-09 ENCOUNTER — Other Ambulatory Visit (HOSPITAL_COMMUNITY): Payer: Self-pay

## 2024-03-09 DIAGNOSIS — M9904 Segmental and somatic dysfunction of sacral region: Secondary | ICD-10-CM | POA: Diagnosis not present

## 2024-03-09 DIAGNOSIS — M5442 Lumbago with sciatica, left side: Secondary | ICD-10-CM | POA: Diagnosis not present

## 2024-03-09 DIAGNOSIS — M51362 Other intervertebral disc degeneration, lumbar region with discogenic back pain and lower extremity pain: Secondary | ICD-10-CM | POA: Diagnosis not present

## 2024-03-09 DIAGNOSIS — K219 Gastro-esophageal reflux disease without esophagitis: Secondary | ICD-10-CM

## 2024-03-09 DIAGNOSIS — M9903 Segmental and somatic dysfunction of lumbar region: Secondary | ICD-10-CM | POA: Diagnosis not present

## 2024-03-09 MED ORDER — PANTOPRAZOLE SODIUM 40 MG PO TBEC
40.0000 mg | DELAYED_RELEASE_TABLET | Freq: Two times a day (BID) | ORAL | 0 refills | Status: DC
  Filled 2024-03-09: qty 180, 90d supply, fill #0

## 2024-03-13 NOTE — Assessment & Plan Note (Signed)
 SABRA

## 2024-03-13 NOTE — Progress Notes (Signed)
 "  Subjective:  Patient ID: Alicia Jordan, female    DOB: 09/19/1945  Age: 78 y.o. MRN: [id number]  Chief Complaint  Patient presents with   Medical Management of Chronic Issues    HPI: Discussed the use of AI scribe software for clinical note transcription with the patient, who gave verbal consent to proceed.  History of Present Illness EATHER CHAIRES is a 78 year old female who presents with ongoing gastrointestinal symptoms and weight loss.  Gastrointestinal symptoms - Onset of diarrhea in May, lasting for three months with frequent bowel movements both day and night - Current bowel movements are not solid but not diarrhea, occurring two to three times daily - Stool studies normal - No hospitalizations for gastrointestinal symptoms - Underwent diagnostic testing including ct abdomen/pelvis - Seeing [facility].  - Weight decreased from 178 pounds in March to 148 pounds, total loss of 30 pounds  Hepatic laboratory abnormalities - Elevated liver enzymes in June - Liver enzymes normalized as of August 5th  Glycemic variability - Monitors blood glucose daily - Blood glucose fluctuations since onset of diarrhea - Medications: Januvia  50 mg daily and farxiga  10 mg daily.   Constitutional and systemic symptoms - No fever, earaches, sore throat, or nasal congestion - Experiences chills and feels cold more often than hot - No significant fatigue - Maintains active lifestyle with housework and social activities       03/14/2024    9:10 AM 12/11/2023    8:16 AM 11/23/2023    3:05 PM 09/09/2023    1:46 PM 05/28/2023   10:09 AM  Depression screen PHQ 2/9  Decreased Interest 0 0 0 0 0  Down, Depressed, Hopeless 0 0 0 0 0  PHQ - 2 Score 0 0 0 0 0  Altered sleeping 0 0 0  0  Tired, decreased energy 0 0 0  0  Change in appetite 0 0 0  0  Feeling bad or failure about yourself  0 0 0  0  Trouble concentrating 0 0 0  0  Moving slowly or fidgety/restless 0 0 0  0  Suicidal  thoughts 0 0 0  0  PHQ-9 Score 0 0 0  0  Difficult doing work/chores Not difficult at all Not difficult at all Not difficult at all  Not difficult at all        03/14/2024    9:09 AM  Fall Risk   Falls in the past year? 0  Number falls in past yr: 0  Injury with Fall? 0  Risk for fall due to : History of fall(s)  Follow up Education provided    Patient Care Team: [name], MD as PCP - General (Family Medicine) Charlanne Groom, MD as Consulting Physician (Gastroenterology) Larnell Purchase, MD as Referring Physician (Orthopedic Surgery) Rubie Kemps, MD as Consulting Physician (Orthopedic Surgery)   Review of Systems  Constitutional:  Positive for chills. Negative for fatigue and fever.  HENT:  Negative for congestion, ear pain and sore throat.   Respiratory:  Negative for cough and shortness of breath.   Cardiovascular:  Negative for chest pain and palpitations.  Gastrointestinal:  Negative for abdominal pain, constipation, diarrhea, nausea and vomiting.  Endocrine: Negative for polydipsia, polyphagia and polyuria.  Genitourinary:  Negative for difficulty urinating and dysuria.  Musculoskeletal:  Negative for arthralgias, back pain and myalgias.  Psychiatric/Behavioral:  Negative for dysphoric mood. The patient is not nervous/anxious.     Current Outpatient Medications on File Prior  to Visit  Medication Sig Dispense Refill   Acetaminophen (TYLENOL PO) Take by mouth as needed.     albuterol  (VENTOLIN  HFA) 108 (90 Base) MCG/ACT inhaler Inhale 2 puffs into the lungs every 4 (four) hours as needed for wheezing or shortness of breath. 8 g 1   azelastine  (ASTELIN ) 0.1 % nasal spray Place 2 sprays into both nostrils 2 (two) times daily as needed for rhinitis. Use in each nostril as directed 30 mL 5   cetirizine  (ZYRTEC ) 10 MG tablet Take 1 tablet (10 mg total) by mouth daily. 90 tablet 3   cholecalciferol  (VITAMIN D3) 25 MCG (1000 UNIT) tablet Take 1 tablet (1,000 Units total) by mouth  daily. 180 tablet 3   dapagliflozin  propanediol (FARXIGA ) 10 MG TABS tablet Take 1 tablet (10 mg) by mouth daily before breakfast. 90 tablet 1   ezetimibe  (ZETIA ) 10 MG tablet Take 1 tablet (10 mg) by mouth daily. 90 tablet 1   famotidine  (PEPCID ) 20 MG tablet Take 1 tablet (20 mg) by mouth at bedtime. 90 tablet 0   FLUoxetine  (PROZAC ) 20 MG capsule Take 2 capsules (40 mg) by mouth daily. 180 capsule 1   glucose blood test strip 1 each by Other route 2 (two) times daily. Use as instructed 100 each 2   ipratropium (ATROVENT ) 0.03 % nasal spray Place 1 spray into both nostrils 3 (three) times daily as needed for rhinitis (runny nose/drainage. use less if becomes too dry.). 30 mL 5   losartan  (COZAAR ) 100 MG tablet Take 1 tablet (100 mg total) by mouth every evening. 90 tablet 1   montelukast  (SINGULAIR ) 10 MG tablet Take 1 tablet (10 mg total) by mouth at bedtime. 90 tablet 3   omega-3 acid ethyl esters (LOVAZA ) 1 g capsule Take 2 capsules (2 g total) by mouth 2 (two) times daily. 360 capsule 1   pantoprazole  (PROTONIX ) 40 MG tablet Take 1 tablet (40 mg total) by mouth 2 (two) times daily. 180 tablet 0   simvastatin  (ZOCOR ) 40 MG tablet TAKE 1 TABLET BY MOUTH AT BEDTIME 90 tablet 0   sitaGLIPtin  (JANUVIA ) 50 MG tablet Take 1 tablet by mouth once daily 90 tablet 0   sodium chloride  (OCEAN) 0.65 % SOLN nasal spray Place 1 spray into both nostrils as needed for congestion. Use prior to medicated nasal sprays 88 mL 3   vitamin B-12 (CYANOCOBALAMIN ) 100 MCG tablet Take 100 mcg by mouth daily.     Vitamin D , Ergocalciferol , (DRISDOL ) 1.25 MG (50000 UNIT) CAPS capsule Take 1 capsule (50,000 Units total) by mouth every 7 (seven) days on Saturday. 5 capsule 1   No current facility-administered medications on file prior to visit.   Past Medical History:  Diagnosis Date   Allergy     Anemia    Anxiety    Arthralgia of left temporomandibular joint    Arthritis    Cervicalgia    Chronic kidney disease,  stage 3a (HCC)    Depression    Diabetes mellitus without complication (HCC)    Hesitancy of micturition    Hyperlipidemia    Hypertension    Osteoporosis    Other psoriasis    Other specified menopausal and perimenopausal disorders    Overweight    Repeated falls    Residual hemorrhoidal skin tags    Past Surgical History:  Procedure Laterality Date   ABDOMINAL HYSTERECTOMY     APPENDECTOMY     CARPAL TUNNEL RELEASE Right    CATARACT EXTRACTION W/ INTRAOCULAR LENS  IMPLANT Left 04/01/2023   CATARACT EXTRACTION W/ INTRAOCULAR LENS IMPLANT Right 03/18/2023   CHOLECYSTECTOMY     COLONOSCOPY  04/09/2016   Mild sigmoid diverticulosis. Otherwise normal colonoscopy   ESOPHAGOGASTRODUODENOSCOPY  09/10/2012   No obvious esophageal stricture, status post empiric esophageal dilation. Irregular GE junction, questionable significance. Mild gastritis.   eye lid surgery     KNEE SURGERY Left    REPLACEMENT TOTAL KNEE Right 06/03/2021    Family History  Problem Relation Age of Onset   Diabetes type II Mother    Hyperlipidemia Mother    Hypertension Mother    Dementia Mother    Heart attack Father    Hyperlipidemia Father    Hypertension Father    Stroke Father    Diabetes type II Father    Colon cancer Daughter 51   Cancer Daughter    Esophageal cancer Neg Hx    Rectal cancer Neg Hx    Stomach cancer Neg Hx    Social History   Socioeconomic History   Marital status: Married    Spouse name: Helayne   Number of children: 4   Years of education: Not on file   Highest education level: Not on file  Occupational History   Occupation: Homemaker  Tobacco Use   Smoking status: Never    Passive exposure: Never   Smokeless tobacco: Never  Vaping Use   Vaping status: Never Used  Substance and Sexual Activity   Alcohol use: Never   Drug use: Never   Sexual activity: Not Currently  Other Topics Concern   Not on file  Social History Narrative   Jimeka spends a lot if time helping  her daughter get to and from oncology appointments   Her husband has started drinking alcohol again   Social Drivers of Health   Financial Resource Strain: Medium Risk (02/19/2023)   Overall Financial Resource Strain (CARDIA)    Difficulty of Paying Living Expenses: Somewhat hard  Food Insecurity: No Food Insecurity (02/19/2023)   Hunger Vital Sign    Worried About Running Out of Food in the Last Year: Never true    Ran Out of Food in the Last Year: Never true  Transportation Needs: No Transportation Needs (02/19/2023)   PRAPARE - Administrator, Civil Service (Medical): No    Lack of Transportation (Non-Medical): No  Physical Activity: Sufficiently Active (02/19/2023)   Exercise Vital Sign    Days of Exercise per Week: 5 days    Minutes of Exercise per Session: 30 min  Stress: No Stress Concern Present (02/19/2023)   [employer] of Occupational Health - Occupational Stress Questionnaire    Feeling of Stress : Only a little  Social Connections: Socially Integrated (02/19/2023)   Social Connection and Isolation Panel    Frequency of Communication with Friends and Family: More than three times a week    Frequency of Social Gatherings with Friends and Family: More than three times a week    Attends Religious Services: More than 4 times per year    Active Member of [employer] or Organizations: Yes    Attends Engineer, Structural: More than 4 times per year    Marital Status: Married    Objective:  BP (!) 100/56   Pulse 65   Temp (!) 97.2 F (36.2 C)   Resp 16   Ht 5' 1 (1.549 m)   Wt 148 lb (67.1 kg)   SpO2 96%   BMI 27.96 kg/m  03/14/2024    8:57 AM 02/17/2024    8:24 AM 01/19/2024    9:10 AM  BP/Weight  Systolic BP 100 140 120  Diastolic BP 56 66 60  Wt. (Lbs) 148 148 150  BMI 27.96 kg/m2 27.96 kg/m2 28.34 kg/m2    Physical Exam Vitals reviewed.  Constitutional:      Appearance: Normal appearance. She is normal weight.  Neck:     Vascular: No  carotid bruit.  Cardiovascular:     Rate and Rhythm: Normal rate and regular rhythm.     Pulses: Normal pulses.     Heart sounds: Normal heart sounds.  Pulmonary:     Effort: Pulmonary effort is normal. No respiratory distress.     Breath sounds: Normal breath sounds.  Abdominal:     General: Abdomen is flat. Bowel sounds are normal.     Palpations: Abdomen is soft.     Tenderness: There is abdominal tenderness (mild periumbilical and LLQ).  Neurological:     Mental Status: She is alert and oriented to person, place, and time.  Psychiatric:        Mood and Affect: Mood normal.        Behavior: Behavior normal.      Diabetic foot exam was performed with the following findings:   No deformities, ulcerations, or other skin breakdown Normal sensation of 10g monofilament Intact posterior tibialis and dorsalis pedis pulses      Lab Results  Component Value Date   WBC 5.4 01/19/2024   HGB 13.1 01/19/2024   HCT 40.0 01/19/2024   PLT 155.0 01/19/2024   GLUCOSE 147 (H) 01/06/2024   CHOL 128 02/19/2023   TRIG 92 02/19/2023   HDL 50 02/19/2023   LDLCALC 61 02/19/2023   ALT 21 01/19/2024   AST 22 01/19/2024   NA 141 01/06/2024   K 5.1 01/06/2024   CL 103 01/06/2024   CREATININE 1.53 (H) 01/06/2024   BUN 23 01/06/2024   CO2 24 01/06/2024   TSH 3.83 01/19/2024   INR 1.0 12/14/2020   HGBA1C 7.3 03/14/2024    Results for orders placed or performed in visit on 03/14/24  POCT Lipid Panel   Collection Time: 03/14/24  9:32 AM  Result Value Ref Range   TC 120    HDL 44    TRG 103    LDL 56    Non-HDL 77    TC/HDL     LDL/HDL Ratio 1.3   POCT glycosylated hemoglobin (Hb A1C)   Collection Time: 03/14/24  9:32 AM  Result Value Ref Range   Hemoglobin A1C     HbA1c POC (<> result, manual entry) 7.3 4.0 - 5.6 %   HbA1c, POC (prediabetic range)     HbA1c, POC (controlled diabetic range)    .  Assessment & Plan:   Assessment & Plan Diabetic glomerulopathy (HCC) Blood  glucose fluctuating due to dietary changes during illness. A1c slightly elevated. - Continue Januvia  50 mg once daily and Farxiga  10 mg once daily. - Monitor blood glucose levels daily.  Orders:   POCT glycosylated hemoglobin (Hb A1C)   Vitamin B12   Methylmalonic acid, serum  Mixed hyperlipidemia Well controlled.  No changes to medicines.  Continue Simvastatin  40 mg daily, Zetia  10 mg daily and lovaza  Continue to work on eating a healthy diet and exercise.   Orders:   POCT Lipid Panel   Hypertensive kidney disease with stage 3b chronic kidney disease (HCC) Blood pressure low at 100/56 mmHg, possibly  due to dehydration. Previous readings higher. - Monitor blood pressure at home once daily for a week. - Reassess blood pressure medication based on home readings. - Plan to decrease losartan  to 50 mg daily if bp is low normal.  - Her bp may be better controlled due to weight loss.   Orders:   CBC with Differential/Platelet   Comprehensive metabolic panel with GFR  Abnormal loss of weight Lost 30 pounds since March, now stable. Likely related to previous diarrhea. - Maintain current weight.    GERD without esophagitis On Protonix  40mg  twice daily and Pepcid  20 mg daily.      Chronic diarrhea Diarrhea improved, stools not solid. Previous workup normal.  Colonoscopy deferred due to patient's concerns about anesthesia and procedure risks.  Patient preferred to monitor.. - Monitor symptoms and consider colonoscopy if symptoms worsen.    B12 deficiency B12 supplementation ongoing. Level not recently checked. - Order B12 level.    Vitamin D  insufficiency Vitamin D  supplementation ongoing. Level not recently checked. - Order vitamin D  level.  Orders:   VITAMIN D  25 Hydroxy (Vit-D Deficiency, Fractures)  Non-seasonal allergic rhinitis due to pollen Managed with Singulair , azelastine , Atrovent  nasal sprays, and Zyrtec .     Encounter for immunization  Orders:   Flu  vaccine HIGH DOSE PF(Fluzone Trivalent)    Body mass index is 27.96 kg/m.  No orders of the defined types were placed in this encounter.   Orders Placed This Encounter  Procedures   Flu vaccine HIGH DOSE PF(Fluzone Trivalent)   CBC with Differential/Platelet   Comprehensive metabolic panel with GFR   VITAMIN D  25 Hydroxy (Vit-D Deficiency, Fractures)   Vitamin B12   Methylmalonic acid, serum   POCT Lipid Panel   POCT glycosylated hemoglobin (Hb A1C)    I,Marla I Leal-Borjas,acting as a scribe for Abigail Free, MD.,have documented all relevant documentation on the behalf of Abigail Free, MD,as directed by  Abigail Free, MD while in the presence of Abigail Free, MD.   Follow-up: Return in about 4 months (around 07/14/2024) for chronic follow up.  An After Visit Summary was printed and given to the patient.  I attest that I have reviewed this visit and agree with the plan scribed by my staff.   Abigail Free, MD [facility] [phone]    "

## 2024-03-14 ENCOUNTER — Encounter: Payer: Self-pay | Admitting: Family Medicine

## 2024-03-14 ENCOUNTER — Ambulatory Visit (INDEPENDENT_AMBULATORY_CARE_PROVIDER_SITE_OTHER): Admitting: Family Medicine

## 2024-03-14 VITALS — BP 100/56 | HR 65 | Temp 97.2°F | Resp 16 | Ht 61.0 in | Wt 148.0 lb

## 2024-03-14 DIAGNOSIS — N1832 Chronic kidney disease, stage 3b: Secondary | ICD-10-CM

## 2024-03-14 DIAGNOSIS — K529 Noninfective gastroenteritis and colitis, unspecified: Secondary | ICD-10-CM

## 2024-03-14 DIAGNOSIS — E538 Deficiency of other specified B group vitamins: Secondary | ICD-10-CM

## 2024-03-14 DIAGNOSIS — R634 Abnormal weight loss: Secondary | ICD-10-CM | POA: Diagnosis not present

## 2024-03-14 DIAGNOSIS — E782 Mixed hyperlipidemia: Secondary | ICD-10-CM | POA: Diagnosis not present

## 2024-03-14 DIAGNOSIS — E559 Vitamin D deficiency, unspecified: Secondary | ICD-10-CM

## 2024-03-14 DIAGNOSIS — Z23 Encounter for immunization: Secondary | ICD-10-CM

## 2024-03-14 DIAGNOSIS — J301 Allergic rhinitis due to pollen: Secondary | ICD-10-CM | POA: Diagnosis not present

## 2024-03-14 DIAGNOSIS — E1121 Type 2 diabetes mellitus with diabetic nephropathy: Secondary | ICD-10-CM

## 2024-03-14 DIAGNOSIS — K219 Gastro-esophageal reflux disease without esophagitis: Secondary | ICD-10-CM

## 2024-03-14 DIAGNOSIS — I129 Hypertensive chronic kidney disease with stage 1 through stage 4 chronic kidney disease, or unspecified chronic kidney disease: Secondary | ICD-10-CM

## 2024-03-14 DIAGNOSIS — I950 Idiopathic hypotension: Secondary | ICD-10-CM | POA: Insufficient documentation

## 2024-03-14 LAB — POCT LIPID PANEL
HDL: 44
LDL/HDL Ratio: 1.3
LDL: 56
Non-HDL: 77
TC: 120
TRG: 103

## 2024-03-14 LAB — POCT GLYCOSYLATED HEMOGLOBIN (HGB A1C): HbA1c POC (<> result, manual entry): 7.3 % (ref 4.0–5.6)

## 2024-03-14 NOTE — Assessment & Plan Note (Addendum)
 Diarrhea improved, stools not solid. Previous workup normal.  Colonoscopy deferred due to patient's concerns about anesthesia and procedure risks.  Patient preferred to monitor.. - Monitor symptoms and consider colonoscopy if symptoms worsen.

## 2024-03-14 NOTE — Assessment & Plan Note (Addendum)
 Managed with Singulair , azelastine , Atrovent  nasal sprays, and Zyrtec .

## 2024-03-14 NOTE — Assessment & Plan Note (Addendum)
 Lost 30 pounds since March, now stable. Likely related to previous diarrhea. - Maintain current weight.

## 2024-03-14 NOTE — Patient Instructions (Addendum)
  VISIT SUMMARY: Today, we discussed your ongoing gastrointestinal symptoms, weight loss, and other health concerns. We reviewed your current medications and made plans for monitoring and follow-up care.  YOUR PLAN: TYPE 2 DIABETES MELLITUS WITH DIABETIC NEPHROPATHY: Your blood glucose levels have been fluctuating due to dietary changes during your illness. -Continue taking Januvia  50 mg once daily and Farxiga  10 mg once daily. -Monitor your blood glucose levels daily.  HYPERTENSIVE CHRONIC KIDNEY DISEASE, STAGE UNSPECIFIED: Your blood pressure was low today, possibly due to dehydration. Previous readings have been higher. -Monitor your blood pressure at home once daily for a week. -We will reassess your blood pressure medication based on your home readings.  HYPOTENSION, INTERMITTENT: You have had intermittent low blood pressure, possibly related to dehydration or medication. -Monitor your blood pressure at home once daily for a week.  -Please keep a log and call in or drop it off.  -We will reassess your blood pressure medication based on your home readings.  MIXED HYPERLIPIDEMIA: Your cholesterol levels are being managed with medication. -Continue taking simvastatin  40 mg and Zetia  10 mg daily.  CHRONIC DIARRHEA, IMPROVED: Your diarrhea has improved, but your stools are still not solid. Previous tests were normal, and a colonoscopy is deferred due to anesthesia risks. -Monitor your symptoms and consider a colonoscopy if they worsen.  ABNORMAL WEIGHT LOSS, NOW STABLE: You lost 30 pounds since March, likely due to previous diarrhea, but your weight is now stable. -Maintain your current weight.  VITAMIN D  DEFICIENCY, ON SUPPLEMENTATION: You are taking vitamin D  supplements, but your levels have not been checked recently. -We will order a vitamin D  level test.  DEFICIENCY OF OTHER SPECIFIED B GROUP VITAMINS: You are taking B12 supplements, but your levels have not been checked  recently. -We will order a B12 level test.  ALLERGIC RHINITIS, ON TREATMENT: Your allergic rhinitis is being managed with medication. -Continue taking Singulair , azelastine , Atrovent  nasal sprays, and Zyrtec .  GASTROESOPHAGEAL REFLUX DISEASE (GERD), ON TREATMENT: Your GERD is being managed with medication. -Continue taking pantoprazole  40 mg and Pepcid  20 mg daily.                      Contains text generated by [employer].                                 Contains text generated by [employer]bridge.

## 2024-03-14 NOTE — Assessment & Plan Note (Deleted)
 Intermittent low blood pressure, possibly related to dehydration or medication. Current reading 100/56 mmHg. - Monitor blood pressure at home once daily for a week. - Reassess blood pressure medication based on home readings.

## 2024-03-14 NOTE — Assessment & Plan Note (Addendum)
 On Protonix  40mg  twice daily and Pepcid  20 mg daily.

## 2024-03-14 NOTE — Assessment & Plan Note (Addendum)
 Vitamin D  supplementation ongoing. Level not recently checked. - Order vitamin D  level.  Orders:   VITAMIN D  25 Hydroxy (Vit-D Deficiency, Fractures)

## 2024-03-14 NOTE — Assessment & Plan Note (Addendum)
 B12 supplementation ongoing. Level not recently checked. - Order B12 level.

## 2024-03-18 LAB — METHYLMALONIC ACID, SERUM: Methylmalonic Acid: 469 nmol/L — ABNORMAL HIGH (ref 0–378)

## 2024-03-18 LAB — CBC WITH DIFFERENTIAL/PLATELET
Basophils Absolute: 0 x10E3/uL (ref 0.0–0.2)
Basos: 0 %
EOS (ABSOLUTE): 0.2 x10E3/uL (ref 0.0–0.4)
Eos: 3 %
Hematocrit: 43.2 % (ref 34.0–46.6)
Hemoglobin: 13.5 g/dL (ref 11.1–15.9)
Immature Grans (Abs): 0 x10E3/uL (ref 0.0–0.1)
Immature Granulocytes: 0 %
Lymphocytes Absolute: 1.9 x10E3/uL (ref 0.7–3.1)
Lymphs: 31 %
MCH: 29 pg (ref 26.6–33.0)
MCHC: 31.3 g/dL — ABNORMAL LOW (ref 31.5–35.7)
MCV: 93 fL (ref 79–97)
Monocytes Absolute: 0.3 x10E3/uL (ref 0.1–0.9)
Monocytes: 5 %
Neutrophils Absolute: 3.7 x10E3/uL (ref 1.4–7.0)
Neutrophils: 61 %
Platelets: 153 x10E3/uL (ref 150–450)
RBC: 4.65 x10E6/uL (ref 3.77–5.28)
RDW: 13.7 % (ref 11.7–15.4)
WBC: 6 x10E3/uL (ref 3.4–10.8)

## 2024-03-18 LAB — COMPREHENSIVE METABOLIC PANEL WITH GFR
ALT: 13 IU/L (ref 0–32)
AST: 17 IU/L (ref 0–40)
Albumin: 4.6 g/dL (ref 3.8–4.8)
Alkaline Phosphatase: 71 IU/L (ref 49–135)
BUN/Creatinine Ratio: 18 (ref 12–28)
BUN: 27 mg/dL (ref 8–27)
Bilirubin Total: 1.1 mg/dL (ref 0.0–1.2)
CO2: 22 mmol/L (ref 20–29)
Calcium: 9.9 mg/dL (ref 8.7–10.3)
Chloride: 103 mmol/L (ref 96–106)
Creatinine, Ser: 1.51 mg/dL — ABNORMAL HIGH (ref 0.57–1.00)
Globulin, Total: 2.2 g/dL (ref 1.5–4.5)
Glucose: 134 mg/dL — ABNORMAL HIGH (ref 70–99)
Potassium: 4.4 mmol/L (ref 3.5–5.2)
Sodium: 141 mmol/L (ref 134–144)
Total Protein: 6.8 g/dL (ref 6.0–8.5)
eGFR: 36 mL/min/1.73 — ABNORMAL LOW (ref >59)

## 2024-03-18 LAB — VITAMIN B12: Vitamin B-12: 501 pg/mL (ref 232–1245)

## 2024-03-18 LAB — VITAMIN D 25 HYDROXY (VIT D DEFICIENCY, FRACTURES): Vit D, 25-Hydroxy: 58.8 ng/mL (ref 30.0–100.0)

## 2024-03-20 ENCOUNTER — Ambulatory Visit: Payer: Self-pay | Admitting: Family Medicine

## 2024-03-23 ENCOUNTER — Ambulatory Visit (INDEPENDENT_AMBULATORY_CARE_PROVIDER_SITE_OTHER)

## 2024-03-23 VITALS — BP 124/62 | HR 71 | Ht 61.0 in | Wt 142.0 lb

## 2024-03-23 DIAGNOSIS — Z0000 Encounter for general adult medical examination without abnormal findings: Secondary | ICD-10-CM

## 2024-03-23 NOTE — Progress Notes (Signed)
 Subjective:   Alicia Jordan is a 78 y.o. female who presents for Medicare Annual (Subsequent) preventive examination.  Visit Complete: Virtual I connected with  [name] on 03/23/24 by a audio enabled telemedicine application and verified that I am speaking with the correct person using two identifiers.  Patient Location: Home  Provider Location: Home Office  I discussed the limitations of evaluation and management by telemedicine. The patient expressed understanding and agreed to proceed.  Vital Signs: Because this visit was a virtual/telehealth visit, some criteria may be missing or patient reported. Any vitals not documented were not able to be obtained and vitals that have been documented are patient reported.  Cardiac Risk Factors include: advanced age (>50men, >68 women);dyslipidemia     Objective:    Today's Vitals   03/23/24 1051  BP: 124/62  Pulse: 71  Weight: 142 lb (64.4 kg)  Height: 5' 1 (1.549 m)   Body mass index is 26.83 kg/m.     03/23/2024   10:58 AM 02/19/2023   10:20 AM 02/20/2022    9:04 AM 02/01/2021   11:49 AM 09/06/2020    9:44 AM 08/16/2019   10:27 AM  Advanced Directives  Does Patient Have a Medical Advance Directive? Yes Yes Yes Yes Yes Yes  Type of Estate agent of [place];Out of facility DNR (pink MOST or yellow form);Living will Healthcare Power of [place];Living will Healthcare Power of [place];Living will  Healthcare Power of [employer] of [place];Living will  Does patient want to make changes to medical advance directive?  No - Patient declined No - Patient declined  No - Patient declined No - Patient declined  Copy of Healthcare Power of Attorney in Chart? No - copy requested Yes - validated most recent copy scanned in chart (See row information) Yes - validated most recent copy scanned in chart (See row information)  No - copy requested No - copy requested    Current Medications  (verified) Outpatient Encounter Medications as of 03/23/2024  Medication Sig   Acetaminophen (TYLENOL PO) Take by mouth as needed.   albuterol  (VENTOLIN  HFA) 108 (90 Base) MCG/ACT inhaler Inhale 2 puffs into the lungs every 4 (four) hours as needed for wheezing or shortness of breath.   azelastine  (ASTELIN ) 0.1 % nasal spray Place 2 sprays into both nostrils 2 (two) times daily as needed for rhinitis. Use in each nostril as directed   cetirizine  (ZYRTEC ) 10 MG tablet Take 1 tablet (10 mg total) by mouth daily.   cholecalciferol  (VITAMIN D3) 25 MCG (1000 UNIT) tablet Take 1 tablet (1,000 Units total) by mouth daily.   dapagliflozin  propanediol (FARXIGA ) 10 MG TABS tablet Take 1 tablet (10 mg) by mouth daily before breakfast.   ezetimibe  (ZETIA ) 10 MG tablet Take 1 tablet (10 mg) by mouth daily.   famotidine  (PEPCID ) 20 MG tablet Take 1 tablet (20 mg) by mouth at bedtime.   FLUoxetine  (PROZAC ) 20 MG capsule Take 2 capsules (40 mg) by mouth daily.   glucose blood test strip 1 each by Other route 2 (two) times daily. Use as instructed   ipratropium (ATROVENT ) 0.03 % nasal spray Place 1 spray into both nostrils 3 (three) times daily as needed for rhinitis (runny nose/drainage. use less if becomes too dry.).   losartan  (COZAAR ) 100 MG tablet Take 1 tablet (100 mg total) by mouth every evening.   montelukast  (SINGULAIR ) 10 MG tablet Take 1 tablet (10 mg total) by mouth at bedtime.   omega-3  acid ethyl esters (LOVAZA ) 1 g capsule Take 2 capsules (2 g total) by mouth 2 (two) times daily.   pantoprazole  (PROTONIX ) 40 MG tablet Take 1 tablet (40 mg total) by mouth 2 (two) times daily.   simvastatin  (ZOCOR ) 40 MG tablet TAKE 1 TABLET BY MOUTH AT BEDTIME   sitaGLIPtin  (JANUVIA ) 50 MG tablet Take 1 tablet by mouth once daily   sodium chloride  (OCEAN) 0.65 % SOLN nasal spray Place 1 spray into both nostrils as needed for congestion. Use prior to medicated nasal sprays   vitamin B-12 (CYANOCOBALAMIN ) 100 MCG  tablet Take 100 mcg by mouth daily.   Vitamin D , Ergocalciferol , (DRISDOL ) 1.25 MG (50000 UNIT) CAPS capsule Take 1 capsule (50,000 Units total) by mouth every 7 (seven) days on Saturday.   No facility-administered encounter medications on file as of 03/23/2024.    Allergies (verified) Meloxicam, Rosuvastatin , and Semaglutide    History: Past Medical History:  Diagnosis Date   Allergy     Anemia    Anxiety    Arthralgia of left temporomandibular joint    Arthritis    Cervicalgia    Chronic kidney disease, stage 3a (HCC)    Depression    Diabetes mellitus without complication (HCC)    Hesitancy of micturition    Hyperlipidemia    Hypertension    Osteoporosis    Other psoriasis    Other specified menopausal and perimenopausal disorders    Overweight    Repeated falls    Residual hemorrhoidal skin tags    Past Surgical History:  Procedure Laterality Date   ABDOMINAL HYSTERECTOMY     APPENDECTOMY     CARPAL TUNNEL RELEASE Right    CATARACT EXTRACTION W/ INTRAOCULAR LENS IMPLANT Left 04/01/2023   CATARACT EXTRACTION W/ INTRAOCULAR LENS IMPLANT Right 03/18/2023   CHOLECYSTECTOMY     COLONOSCOPY  04/09/2016   Mild sigmoid diverticulosis. Otherwise normal colonoscopy   ESOPHAGOGASTRODUODENOSCOPY  09/10/2012   No obvious esophageal stricture, status post empiric esophageal dilation. Irregular GE junction, questionable significance. Mild gastritis.   eye lid surgery     KNEE SURGERY Left    REPLACEMENT TOTAL KNEE Right 06/03/2021   Family History  Problem Relation Age of Onset   Diabetes type II Mother    Hyperlipidemia Mother    Hypertension Mother    Dementia Mother    Heart attack Father    Hyperlipidemia Father    Hypertension Father    Stroke Father    Diabetes type II Father    Colon cancer Daughter 8   Cancer Daughter    Esophageal cancer Neg Hx    Rectal cancer Neg Hx    Stomach cancer Neg Hx    Social History   Socioeconomic History   Marital status:  Married    Spouse name: Helayne   Number of children: 4   Years of education: Not on file   Highest education level: Not on file  Occupational History   Occupation: Homemaker  Tobacco Use   Smoking status: Never    Passive exposure: Never   Smokeless tobacco: Never  Vaping Use   Vaping status: Never Used  Substance and Sexual Activity   Alcohol use: Never   Drug use: Never   Sexual activity: Not Currently  Other Topics Concern   Not on file  Social History Narrative   Cielo spends a lot if time helping her daughter get to and from oncology appointments   Her husband has started drinking alcohol again   Social Drivers  of Health   Financial Resource Strain: Low Risk  (03/23/2024)   Overall Financial Resource Strain (CARDIA)    Difficulty of Paying Living Expenses: Not hard at all  Food Insecurity: No Food Insecurity (03/23/2024)   Hunger Vital Sign    Worried About Running Out of Food in the Last Year: Never true    Ran Out of Food in the Last Year: Never true  Transportation Needs: No Transportation Needs (03/23/2024)   PRAPARE - Administrator, Civil Service (Medical): No    Lack of Transportation (Non-Medical): No  Physical Activity: Insufficiently Active (03/23/2024)   Exercise Vital Sign    Days of Exercise per Week: 4 days    Minutes of Exercise per Session: 30 min  Stress: No Stress Concern Present (03/23/2024)   [employer] of Occupational Health - Occupational Stress Questionnaire    Feeling of Stress: Not at all  Social Connections: Moderately Integrated (03/23/2024)   Social Connection and Isolation Panel    Frequency of Communication with Friends and Family: More than three times a week    Frequency of Social Gatherings with Friends and Family: Three times a week    Attends Religious Services: More than 4 times per year    Active Member of Clubs or Organizations: No    Attends Banker Meetings: Never    Marital Status: Married     Tobacco Counseling Counseling given: Not Answered   Clinical Intake:  Pre-visit preparation completed: No  Pain : No/denies pain     BMI - recorded: 26.83 Nutritional Status: BMI 25 -29 Overweight Nutritional Risks: None Diabetes: Yes CBG done?: No Did pt. bring in CBG monitor from home?: No  How often do you need to have someone help you when you read instructions, pamphlets, or other written materials from your doctor or pharmacy?: 1 - Never What is the last grade level you completed in school?: 10th grade  Interpreter Needed?: No      Activities of Daily Living    03/23/2024   10:59 AM  In your present state of health, do you have any difficulty performing the following activities:  Hearing? 0  Vision? 0  Comment wear glasses  Difficulty concentrating or making decisions? 1  Comment sometimes per patient  Walking or climbing stairs? 0  Dressing or bathing? 0  Doing errands, shopping? 0  Preparing Food and eating ? N  Using the Toilet? N  In the past six months, have you accidently leaked urine? N  Do you have problems with loss of bowel control? N  Managing your Medications? N  Managing your Finances? N  Housekeeping or managing your Housekeeping? N    Patient Care Team: [name], MD as PCP - General (Family Medicine) Charlanne Groom, MD as Consulting Physician (Gastroenterology) Larnell Purchase, MD as Referring Physician (Orthopedic Surgery) Rubie Kemps, MD as Consulting Physician (Orthopedic Surgery)  Indicate any recent Medical Services you may have received from other than [facility] providers in the past year (date may be approximate).     Assessment:   This is a routine wellness examination for [place].  Hearing/Vision screen No results found.   Goals Addressed               This Visit's Progress     Patient Stated (pt-stated)        Patient stated no goals really, she just takes it one day at a time.       Depression Screen  03/23/2024   10:58 AM 03/14/2024    9:10 AM 12/11/2023    8:16 AM 11/23/2023    3:05 PM 09/09/2023    1:46 PM 05/28/2023   10:09 AM 02/19/2023    9:34 AM  PHQ 2/9 Scores  PHQ - 2 Score 0 0 0 0 0 0 0  PHQ- 9 Score 2 0 0 0  0 2    Fall Risk    03/23/2024   10:58 AM 03/14/2024    9:09 AM 12/11/2023    8:15 AM 09/09/2023    1:46 PM 05/28/2023   10:09 AM  Fall Risk   Falls in the past year? 0 0 0 0 0  Number falls in past yr: 0 0 0 0 0  Injury with Fall? 0 0 0 0 0  Risk for fall due to : No Fall Risks History of fall(s) No Fall Risks No Fall Risks No Fall Risks  Follow up Falls evaluation completed Education provided Falls evaluation completed  Falls evaluation completed    MEDICARE RISK AT HOME: Medicare Risk at Home Any stairs in or around the home?: No If so, are there any without handrails?: No Home free of loose throw rugs in walkways, pet beds, electrical cords, etc?: Yes Adequate lighting in your home to reduce risk of falls?: Yes Life alert?: No Use of a cane, walker or w/c?: No Grab bars in the bathroom?: Yes Shower chair or bench in shower?: Yes Elevated toilet seat or a handicapped toilet?: Yes   Cognitive Function:        03/23/2024   11:01 AM 02/19/2023   10:21 AM 02/20/2022    9:05 AM 09/06/2020   10:32 AM 08/16/2019   10:31 AM  6CIT Screen  What Year? 0 points 0 points 0 points 0 points 0 points  What month? 0 points 0 points 0 points 0 points 0 points  What time? 0 points 0 points 0 points 0 points 0 points  Count back from 20 2 points 0 points 0 points 0 points 0 points  Months in reverse 2 points 2 points 2 points 0 points 0 points  Repeat phrase 0 points 0 points 2 points 0 points 0 points  Total Score 4 points 2 points 4 points 0 points 0 points    Immunizations Immunization History  Administered Date(s) Administered   Fluad Quad(high Dose 65+) 02/08/2019, 03/29/2021, 04/22/2022   Fluad Trivalent(High Dose 65+) 02/19/2023   INFLUENZA, HIGH DOSE SEASONAL PF  03/14/2024   Influenza-Unspecified 02/27/2020   Moderna Covid-19 Fall Seasonal Vaccine 47yrs & older 04/25/2023   Moderna Sars-Covid-2 Vaccination 10/31/2019, 11/28/2019, 05/31/2020, 10/12/2020   PNEUMOCOCCAL CONJUGATE-20 11/22/2021   Pfizer Covid-19 Vaccine Bivalent Booster 50yrs & up 03/29/2021   Pneumococcal Conjugate-13 05/13/2014   Pneumococcal Polysaccharide-23 01/29/2017   Tdap 05/03/2012, 01/31/2022   Zoster Recombinant(Shingrix) 06/11/2017   Zoster, Live 05/16/2010    TDAP status: Up to date  Flu Vaccine status: Up to date  Pneumococcal vaccine status: Up to date  Covid-19 vaccine status: Completed vaccines  Qualifies for Shingles Vaccine? Yes   Zostavax completed Yes   Shingrix Completed?: Yes  Screening Tests Health Maintenance  Topic Date Due   COVID-19 Vaccine (7 - 2024-25 season) 02/15/2024   OPHTHALMOLOGY EXAM  02/23/2024   Mammogram  04/14/2024   HEMOGLOBIN A1C  09/11/2024   Diabetic kidney evaluation - Urine ACR  01/01/2025   Diabetic kidney evaluation - eGFR measurement  03/14/2025   FOOT EXAM  03/14/2025  Medicare Annual Wellness (AWV)  03/23/2025   DEXA SCAN  04/14/2025   DTaP/Tdap/Td (3 - Td or Tdap) 02/01/2032   Pneumococcal Vaccine: 50+ Years  Completed   Influenza Vaccine  Completed   Hepatitis C Screening  Completed   Meningococcal B Vaccine  Aged Out   Colonoscopy  Discontinued   Zoster Vaccines- Shingrix  Discontinued    Health Maintenance  Health Maintenance Due  Topic Date Due   COVID-19 Vaccine (7 - 2024-25 season) 02/15/2024   OPHTHALMOLOGY EXAM  02/23/2024   Mammogram  04/14/2024    Colon cancer screening: no longer required.   Mammogram status: Completed 10.30.2024. Repeat every year  Bone Density status: Completed 10.30.2024. Results reflect: Bone density results: OSTEOPENIA. Repeat every 2 years.  Additional Screening:  Hepatitis C Screening: does qualify; Completed 06.30.2025.  Vision Screening: Recommended annual  ophthalmology exams for early detection of glaucoma and other disorders of the eye. Is the patient up to date with their annual eye exam?  Yes  Who is the provider or what is the name of the office in which the patient attends annual eye exams? [facility]  Dental Screening: Recommended annual dental exams for proper oral hygiene  Diabetic Foot Exam: Diabetic Foot Exam: Completed 09.29.2025  Community Resource Referral / Chronic Care Management: CRR required this visit?  No   CCM required this visit?  No     Plan:     I have personally reviewed and noted the following in the patient's chart:   Medical and social history Use of alcohol, tobacco or illicit drugs  Current medications and supplements including opioid prescriptions. Patient is currently taking opioid prescriptions. Information provided to patient regarding non-opioid alternatives. Patient advised to discuss non-opioid treatment plan with their provider. Functional ability and status Nutritional status Physical activity Advanced directives List of other physicians Hospitalizations, surgeries, and ER visits in previous 12 months Vitals Screenings to include cognitive, depression, and falls Referrals and appointments  In addition, I have reviewed and discussed with patient certain preventive protocols, quality metrics, and best practice recommendations. A written personalized care plan for preventive services as well as general preventive health recommendations were provided to patient.     Shatisha Falter [place], [place]   03/23/2024   After Visit Summary: (Declined) Due to this being a telephonic visit, with patients personalized plan was offered to patient but patient Declined AVS at this time   Nurse Notes: I spent 15 minutes with patient on the phone doing her visit. Her next annual wellness visit is October 21st, 2026 at 0900. Patient has no questions or concerns are this time.

## 2024-03-23 NOTE — Patient Instructions (Signed)
 Ms. Alicia Jordan , Thank you for taking time to come for your Medicare Wellness Visit. I appreciate your ongoing commitment to your health goals. Please review the following plan we discussed and let me know if I can assist you in the future.   These are the goals we discussed:  Goals       Learn More About My Health      Timeframe:  Long-Range Goal Priority:  High Start Date:                             Expected End Date:                        Follow Up Date 12/24/2020    - tell my story and reason for my visit - make a list of questions - ask questions - repeat what I heard to make sure I understand - bring a list of my medicines to the visit - speak up when I don't understand    Why is this important?   The best way to learn about your health and care is by talking to the doctor and nurse.  They will answer your questions and give you information in the way that you like best.    Notes:       Manage My Medicine      Timeframe:  Long-Range Goal Priority:  High Start Date:                             Expected End Date:                       Follow Up Date 12/24/2020    - call for medicine refill 2 or 3 days before it runs out - keep a list of all the medicines I take; vitamins and herbals too - use a pillbox to sort medicine    Why is this important?   These steps will help you keep on track with your medicines.   Notes:       Monitor and Manage My Blood Sugar-Diabetes Type 2      Timeframe:  Long-Range Goal Priority:  High Start Date:                             Expected End Date:                       Follow Up Date 12/24/2020    - check blood sugar at prescribed times - check blood sugar if I feel it is too high or too low - take the blood sugar log to all doctor visits    Why is this important?   Checking your blood sugar at home helps to keep it from getting very high or very low.  Writing the results in a diary or log helps the doctor know how to care  for you.  Your blood sugar log should have the time, date and the results.  Also, write down the amount of insulin or other medicine that you take.  Other information, like what you ate, exercise done and how you were feeling, will also be helpful.     Notes:       Patient Stated (pt-stated)  Patient stated no goals really, she just takes it one day at a time.      Prevent falls        This is a list of the screening recommended for you and due dates:  Health Maintenance  Topic Date Due   COVID-19 Vaccine (7 - 2024-25 season) 02/15/2024   Eye exam for diabetics  02/23/2024   Breast Cancer Screening  04/14/2024   Hemoglobin A1C  09/11/2024   Yearly kidney health urinalysis for diabetes  01/01/2025   Yearly kidney function blood test for diabetes  03/14/2025   Complete foot exam   03/14/2025   Medicare Annual Wellness Visit  03/23/2025   DEXA scan (bone density measurement)  04/14/2025   DTaP/Tdap/Td vaccine (3 - Td or Tdap) 02/01/2032   Pneumococcal Vaccine for age over 71  Completed   Flu Shot  Completed   Hepatitis C Screening  Completed   Meningitis B Vaccine  Aged Out   Colon Cancer Screening  Discontinued   Zoster (Shingles) Vaccine  Discontinued    Advanced directives: yes.  Next appointment: Follow up in one year for your annual wellness visit 10.21.2025 at 0900.   Preventive Care 52 Years and Older, Female Preventive care refers to lifestyle choices and visits with your health care provider that can promote health and wellness. What does preventive care include? A yearly physical exam. This is also called an annual well check. Dental exams once or twice a year. Routine eye exams. Ask your health care provider how often you should have your eyes checked. Personal lifestyle choices, including: Daily care of your teeth and gums. Regular physical activity. Eating a healthy diet. Avoiding tobacco and drug use. Limiting alcohol use. Practicing safe  sex. Taking low-dose aspirin every day. Taking vitamin and mineral supplements as recommended by your health care provider. What happens during an annual well check? The services and screenings done by your health care provider during your annual well check will depend on your age, overall health, lifestyle risk factors, and family history of disease. Counseling  Your health care provider may ask you questions about your: Alcohol use. Tobacco use. Drug use. Emotional well-being. Home and relationship well-being. Sexual activity. Eating habits. History of falls. Memory and ability to understand (cognition). Work and work Astronomer. Reproductive health. Screening  You may have the following tests or measurements: Height, weight, and BMI. Blood pressure. Lipid and cholesterol levels. These may be checked every 5 years, or more frequently if you are over 77 years old. Skin check. Lung cancer screening. You may have this screening every year starting at age 32 if you have a 30-pack-year history of smoking and currently smoke or have quit within the past 15 years. Fecal occult blood test (FOBT) of the stool. You may have this test every year starting at age 40. Flexible sigmoidoscopy or colonoscopy. You may have a sigmoidoscopy every 5 years or a colonoscopy every 10 years starting at age 57. Hepatitis C blood test. Hepatitis B blood test. Sexually transmitted disease (STD) testing. Diabetes screening. This is done by checking your blood sugar (glucose) after you have not eaten for a while (fasting). You may have this done every 1-3 years. Bone density scan. This is done to screen for osteoporosis. You may have this done starting at age 53. Mammogram. This may be done every 1-2 years. Talk to your health care provider about how often you should have regular mammograms. Talk with your health care provider about your test  results, treatment options, and if necessary, the need for more  tests. Vaccines  Your health care provider may recommend certain vaccines, such as: Influenza vaccine. This is recommended every year. Tetanus, diphtheria, and acellular pertussis (Tdap, Td) vaccine. You may need a Td booster every 10 years. Zoster vaccine. You may need this after age 38. Pneumococcal 13-valent conjugate (PCV13) vaccine. One dose is recommended after age 16. Pneumococcal polysaccharide (PPSV23) vaccine. One dose is recommended after age 53. Talk to your health care provider about which screenings and vaccines you need and how often you need them. This information is not intended to replace advice given to you by your health care provider. Make sure you discuss any questions you have with your health care provider. Document Released: 06/29/2015 Document Revised: 02/20/2016 Document Reviewed: 04/03/2015 Elsevier Interactive Patient Education  2017 [employer].  Fall Prevention in the Home Falls can cause injuries. They can happen to people of all ages. There are many things you can do to make your home safe and to help prevent falls. What can I do on the outside of my home? Regularly fix the edges of walkways and driveways and fix any cracks. Remove anything that might make you trip as you walk through a door, such as a raised step or threshold. Trim any bushes or trees on the path to your home. Use bright outdoor lighting. Clear any walking paths of anything that might make someone trip, such as rocks or tools. Regularly check to see if handrails are loose or broken. Make sure that both sides of any steps have handrails. Any raised decks and porches should have guardrails on the edges. Have any leaves, snow, or ice cleared regularly. Use sand or salt on walking paths during winter. Clean up any spills in your garage right away. This includes oil or grease spills. What can I do in the bathroom? Use night lights. Install grab bars by the toilet and in the tub and shower.  Do not use towel bars as grab bars. Use non-skid mats or decals in the tub or shower. If you need to sit down in the shower, use a plastic, non-slip stool. Keep the floor dry. Clean up any water that spills on the floor as soon as it happens. Remove soap buildup in the tub or shower regularly. Attach bath mats securely with double-sided non-slip rug tape. Do not have throw rugs and other things on the floor that can make you trip. What can I do in the bedroom? Use night lights. Make sure that you have a light by your bed that is easy to reach. Do not use any sheets or blankets that are too big for your bed. They should not hang down onto the floor. Have a firm chair that has side arms. You can use this for support while you get dressed. Do not have throw rugs and other things on the floor that can make you trip. What can I do in the kitchen? Clean up any spills right away. Avoid walking on wet floors. Keep items that you use a lot in easy-to-reach places. If you need to reach something above you, use a strong step stool that has a grab bar. Keep electrical cords out of the way. Do not use floor polish or wax that makes floors slippery. If you must use wax, use non-skid floor wax. Do not have throw rugs and other things on the floor that can make you trip. What can I do with my stairs?  Do not leave any items on the stairs. Make sure that there are handrails on both sides of the stairs and use them. Fix handrails that are broken or loose. Make sure that handrails are as long as the stairways. Check any carpeting to make sure that it is firmly attached to the stairs. Fix any carpet that is loose or worn. Avoid having throw rugs at the top or bottom of the stairs. If you do have throw rugs, attach them to the floor with carpet tape. Make sure that you have a light switch at the top of the stairs and the bottom of the stairs. If you do not have them, ask someone to add them for you. What else  can I do to help prevent falls? Wear shoes that: Do not have high heels. Have rubber bottoms. Are comfortable and fit you well. Are closed at the toe. Do not wear sandals. If you use a stepladder: Make sure that it is fully opened. Do not climb a closed stepladder. Make sure that both sides of the stepladder are locked into place. Ask someone to hold it for you, if possible. Clearly mark and make sure that you can see: Any grab bars or handrails. First and last steps. Where the edge of each step is. Use tools that help you move around (mobility aids) if they are needed. These include: Canes. Walkers. Scooters. Crutches. Turn on the lights when you go into a dark area. Replace any light bulbs as soon as they burn out. Set up your furniture so you have a clear path. Avoid moving your furniture around. If any of your floors are uneven, fix them. If there are any pets around you, be aware of where they are. Review your medicines with your doctor. Some medicines can make you feel dizzy. This can increase your chance of falling. Ask your doctor what other things that you can do to help prevent falls. This information is not intended to replace advice given to you by your health care provider. Make sure you discuss any questions you have with your health care provider. Document Released: 03/29/2009 Document Revised: 11/08/2015 Document Reviewed: 07/07/2014 Elsevier Interactive Patient Education  2017 [employer].

## 2024-03-24 ENCOUNTER — Other Ambulatory Visit: Payer: Self-pay | Admitting: Family Medicine

## 2024-03-24 ENCOUNTER — Telehealth: Payer: Self-pay

## 2024-03-24 NOTE — Telephone Encounter (Signed)
 Patient brought blood pressure log to the office. BP log is from 03/15/24 to 03/21/24. Systolic 110 to 131 and diastolic 52 to 67 pulse 60-70's. Per Dr Cox's note BP Good. Continue with current treatment. Patient's husband, Carloyn Lahue was notified.

## 2024-03-27 ENCOUNTER — Other Ambulatory Visit: Payer: Self-pay | Admitting: Family Medicine

## 2024-04-06 ENCOUNTER — Telehealth: Payer: Self-pay

## 2024-04-06 DIAGNOSIS — M9903 Segmental and somatic dysfunction of lumbar region: Secondary | ICD-10-CM | POA: Diagnosis not present

## 2024-04-06 DIAGNOSIS — M5442 Lumbago with sciatica, left side: Secondary | ICD-10-CM | POA: Diagnosis not present

## 2024-04-06 DIAGNOSIS — M9904 Segmental and somatic dysfunction of sacral region: Secondary | ICD-10-CM | POA: Diagnosis not present

## 2024-04-06 DIAGNOSIS — M51362 Other intervertebral disc degeneration, lumbar region with discogenic back pain and lower extremity pain: Secondary | ICD-10-CM | POA: Diagnosis not present

## 2024-04-06 NOTE — Progress Notes (Unsigned)
   04/06/2024  Patient ID: Alicia Jordan, female   DOB: [birth year], 79 y.o.   MRN: [id number]  Spoke with patient regarding [employer] Farxiga  PAP reenrollment. Electronic reenrollment failed d/t phone number on file being linked to a house phone rather than mobile line so information sent to number cannot be used to verify identify.

## 2024-04-09 ENCOUNTER — Other Ambulatory Visit: Payer: Self-pay | Admitting: Family Medicine

## 2024-04-09 DIAGNOSIS — F330 Major depressive disorder, recurrent, mild: Secondary | ICD-10-CM

## 2024-04-11 ENCOUNTER — Other Ambulatory Visit (HOSPITAL_COMMUNITY): Payer: Self-pay

## 2024-04-11 ENCOUNTER — Other Ambulatory Visit: Payer: Self-pay

## 2024-04-11 MED ORDER — LOSARTAN POTASSIUM 100 MG PO TABS
100.0000 mg | ORAL_TABLET | Freq: Every evening | ORAL | 1 refills | Status: AC
  Filled 2024-04-11: qty 90, 90d supply, fill #0
  Filled 2024-07-08: qty 90, 90d supply, fill #1

## 2024-04-11 MED ORDER — FLUOXETINE HCL 20 MG PO CAPS
40.0000 mg | ORAL_CAPSULE | Freq: Every day | ORAL | 1 refills | Status: AC
  Filled 2024-04-11: qty 180, 90d supply, fill #0
  Filled 2024-07-08: qty 180, 90d supply, fill #1

## 2024-04-20 ENCOUNTER — Other Ambulatory Visit: Payer: Self-pay | Admitting: Family Medicine

## 2024-04-20 DIAGNOSIS — E782 Mixed hyperlipidemia: Secondary | ICD-10-CM

## 2024-04-22 DIAGNOSIS — C44612 Basal cell carcinoma of skin of right upper limb, including shoulder: Secondary | ICD-10-CM | POA: Diagnosis not present

## 2024-04-22 DIAGNOSIS — L821 Other seborrheic keratosis: Secondary | ICD-10-CM | POA: Diagnosis not present

## 2024-04-22 DIAGNOSIS — L578 Other skin changes due to chronic exposure to nonionizing radiation: Secondary | ICD-10-CM | POA: Diagnosis not present

## 2024-04-27 ENCOUNTER — Telehealth: Payer: Self-pay

## 2024-04-27 NOTE — Telephone Encounter (Signed)
 PAP: Patient assistance application for Farxiga  has been approved by PAP Companies: [employer] from 06/16/2024 to 06/15/2025. Medication should be delivered to PAP Delivery: Home. For further shipping updates, please contact [employer] ([employer]) at [phone]. Patient ID is: [id number]

## 2024-04-28 DIAGNOSIS — C44612 Basal cell carcinoma of skin of right upper limb, including shoulder: Secondary | ICD-10-CM | POA: Diagnosis not present

## 2024-05-02 ENCOUNTER — Other Ambulatory Visit (HOSPITAL_COMMUNITY): Payer: Self-pay

## 2024-05-02 ENCOUNTER — Other Ambulatory Visit: Payer: Self-pay | Admitting: Family Medicine

## 2024-05-02 ENCOUNTER — Other Ambulatory Visit: Payer: Self-pay | Admitting: Allergy

## 2024-05-02 ENCOUNTER — Other Ambulatory Visit: Payer: Self-pay

## 2024-05-02 DIAGNOSIS — D3131 Benign neoplasm of right choroid: Secondary | ICD-10-CM | POA: Diagnosis not present

## 2024-05-02 DIAGNOSIS — E1121 Type 2 diabetes mellitus with diabetic nephropathy: Secondary | ICD-10-CM

## 2024-05-02 DIAGNOSIS — E119 Type 2 diabetes mellitus without complications: Secondary | ICD-10-CM | POA: Diagnosis not present

## 2024-05-02 LAB — OPHTHALMOLOGY REPORT-SCANNED

## 2024-05-02 MED ORDER — SITAGLIPTIN PHOSPHATE 50 MG PO TABS
50.0000 mg | ORAL_TABLET | Freq: Every day | ORAL | 0 refills | Status: DC
  Filled 2024-05-02: qty 90, 90d supply, fill #0

## 2024-05-16 ENCOUNTER — Ambulatory Visit (HOSPITAL_BASED_OUTPATIENT_CLINIC_OR_DEPARTMENT_OTHER)
Admission: RE | Admit: 2024-05-16 | Discharge: 2024-05-16 | Disposition: A | Source: Ambulatory Visit | Attending: Family Medicine | Admitting: Family Medicine

## 2024-05-16 ENCOUNTER — Encounter: Payer: Self-pay | Admitting: Family Medicine

## 2024-05-16 ENCOUNTER — Ambulatory Visit: Admitting: Family Medicine

## 2024-05-16 VITALS — BP 132/62 | HR 85 | Temp 98.0°F | Ht 61.0 in | Wt 151.0 lb

## 2024-05-16 DIAGNOSIS — M79671 Pain in right foot: Secondary | ICD-10-CM

## 2024-05-16 DIAGNOSIS — E1121 Type 2 diabetes mellitus with diabetic nephropathy: Secondary | ICD-10-CM | POA: Diagnosis not present

## 2024-05-16 DIAGNOSIS — M25511 Pain in right shoulder: Secondary | ICD-10-CM | POA: Diagnosis not present

## 2024-05-16 DIAGNOSIS — R1031 Right lower quadrant pain: Secondary | ICD-10-CM | POA: Diagnosis not present

## 2024-05-16 DIAGNOSIS — M25519 Pain in unspecified shoulder: Secondary | ICD-10-CM | POA: Diagnosis not present

## 2024-05-16 MED ORDER — PREDNISONE 50 MG PO TABS: 50.0000 mg | ORAL_TABLET | Freq: Every day | ORAL | 0 refills | Status: DC

## 2024-05-16 NOTE — Progress Notes (Signed)
 Acute Office Visit  Subjective:    Patient ID: Alicia Jordan, female    DOB: 1945/11/07, 78 y.o.   MRN: [id number]  Chief Complaint  Patient presents with   Medical Management of Chronic Issues    States her bones hurt (3-4 weeks)     Discussed the use of AI scribe software for clinical note transcription with the patient, who gave verbal consent to proceed.  History of Present Illness Alicia Jordan is a 78 year old female with a history of melanoma who presents with right-sided bone pain.  Right-sided musculoskeletal pain - Persistent pain involving the right thigh, arm, shoulder, foot, and groin - Pain originated in the groin and subsequently developed pain in  the shoulder and foot - Loud 'popping' sound in the right shoulder - Right foot pain worsened after prolonged standing last Thursday - Redness observed on the top of the right foot - No recent falls or trauma to the right side - No weakness reported - No burning sensation associated with the pain  Impact on activities of daily living - Difficulty performing daily tasks, such as needing to use both hands to lift a coffee pot - Concern regarding pain localization to the right side and its effect on stability - Fear of potential fall due to instability  Analgesic use and response - Current regimen includes Tylenol, taken two to four times daily depending on symptom severity - No significant relief from Tylenol, including time-release formulations  History of melanoma - Prior melanoma removal from the right upper arm - Persistent right arm pain despite melanoma excision  Associated symptoms - No fevers, chills, sweats, earaches, sore throat, stuffy nose, chest pain, breathing problems, headaches, dizziness, constipation, or bladder symptoms    Past Medical History:  Diagnosis Date   Allergy     Anemia    Anxiety    Arthralgia of left temporomandibular joint    Arthritis    Cervicalgia    Chronic  kidney disease, stage 3a (HCC)    Depression    Diabetes mellitus without complication (HCC)    Hesitancy of micturition    Hyperlipidemia    Hypertension    Osteoporosis    Other psoriasis    Other specified menopausal and perimenopausal disorders    Overweight    Repeated falls    Residual hemorrhoidal skin tags     Past Surgical History:  Procedure Laterality Date   ABDOMINAL HYSTERECTOMY     APPENDECTOMY     CARPAL TUNNEL RELEASE Right    CATARACT EXTRACTION W/ INTRAOCULAR LENS IMPLANT Left 04/01/2023   CATARACT EXTRACTION W/ INTRAOCULAR LENS IMPLANT Right 03/18/2023   CHOLECYSTECTOMY     COLONOSCOPY  04/09/2016   Mild sigmoid diverticulosis. Otherwise normal colonoscopy   ESOPHAGOGASTRODUODENOSCOPY  09/10/2012   No obvious esophageal stricture, status post empiric esophageal dilation. Irregular GE junction, questionable significance. Mild gastritis.   eye lid surgery     KNEE SURGERY Left    MELANOMA EXCISION Left    Shoulder   REPLACEMENT TOTAL KNEE Right 06/03/2021    Family History  Problem Relation Age of Onset   Diabetes type II Mother    Hyperlipidemia Mother    Hypertension Mother    Dementia Mother    Heart attack Father    Hyperlipidemia Father    Hypertension Father    Stroke Father    Diabetes type II Father    Colon cancer Daughter 63   Cancer Daughter    Esophageal cancer  Neg Hx    Rectal cancer Neg Hx    Stomach cancer Neg Hx     Social History   Socioeconomic History   Marital status: Married    Spouse name: Helayne   Number of children: 4   Years of education: Not on file   Highest education level: Not on file  Occupational History   Occupation: Homemaker  Tobacco Use   Smoking status: Never    Passive exposure: Never   Smokeless tobacco: Never  Vaping Use   Vaping status: Never Used  Substance and Sexual Activity   Alcohol use: Never   Drug use: Never   Sexual activity: Not Currently  Other Topics Concern   Not on file   Social History Narrative   Veora spends a lot if time helping her daughter get to and from oncology appointments   Her husband has started drinking alcohol again   Social Drivers of Health   Financial Resource Strain: Low Risk  (03/23/2024)   Overall Financial Resource Strain (CARDIA)    Difficulty of Paying Living Expenses: Not hard at all  Food Insecurity: No Food Insecurity (03/23/2024)   Hunger Vital Sign    Worried About Running Out of Food in the Last Year: Never true    Ran Out of Food in the Last Year: Never true  Transportation Needs: No Transportation Needs (03/23/2024)   PRAPARE - Administrator, Civil Service (Medical): No    Lack of Transportation (Non-Medical): No  Physical Activity: Insufficiently Active (03/23/2024)   Exercise Vital Sign    Days of Exercise per Week: 4 days    Minutes of Exercise per Session: 30 min  Stress: No Stress Concern Present (03/23/2024)   [employer] of Occupational Health - Occupational Stress Questionnaire    Feeling of Stress: Not at all  Social Connections: Moderately Integrated (03/23/2024)   Social Connection and Isolation Panel    Frequency of Communication with Friends and Family: More than three times a week    Frequency of Social Gatherings with Friends and Family: Three times a week    Attends Religious Services: More than 4 times per year    Active Member of Clubs or Organizations: No    Attends Banker Meetings: Never    Marital Status: Married  Catering Manager Violence: Not At Risk (03/23/2024)   Humiliation, Afraid, Rape, and Kick questionnaire    Fear of Current or Ex-Partner: No    Emotionally Abused: No    Physically Abused: No    Sexually Abused: No    Outpatient Medications Prior to Visit  Medication Sig Dispense Refill   Acetaminophen (TYLENOL PO) Take by mouth as needed.     albuterol  (VENTOLIN  HFA) 108 (90 Base) MCG/ACT inhaler INHALE 2 PUFFS BY MOUTH EVERY 4 HOURS AS NEEDED FOR  WHEEZING OR SHORTNESS OF BREATH 18 g 0   azelastine  (ASTELIN ) 0.1 % nasal spray Place 2 sprays into both nostrils 2 (two) times daily as needed for rhinitis. Use in each nostril as directed 30 mL 5   cetirizine  (ZYRTEC ) 10 MG tablet Take 1 tablet (10 mg total) by mouth daily. 90 tablet 3   cholecalciferol  (VITAMIN D3) 25 MCG (1000 UNIT) tablet Take 1 tablet (1,000 Units total) by mouth daily. 180 tablet 3   dapagliflozin  propanediol (FARXIGA ) 10 MG TABS tablet Take 1 tablet (10 mg) by mouth daily before breakfast. 90 tablet 1   ezetimibe  (ZETIA ) 10 MG tablet Take 1 tablet (10 mg)  by mouth daily. 90 tablet 1   famotidine  (PEPCID ) 20 MG tablet Take 1 tablet (20 mg) by mouth at bedtime. 90 tablet 0   FLUoxetine  (PROZAC ) 20 MG capsule Take 2 capsules (40 mg) by mouth daily. 180 capsule 1   glucose blood test strip 1 each by Other route 2 (two) times daily. Use as instructed 100 each 2   ipratropium (ATROVENT ) 0.03 % nasal spray Place 1 spray into both nostrils 3 (three) times daily as needed for rhinitis (runny nose/drainage. use less if becomes too dry.). 30 mL 5   losartan  (COZAAR ) 100 MG tablet Take 1 tablet (100 mg total) by mouth every evening. 90 tablet 1   montelukast  (SINGULAIR ) 10 MG tablet Take 1 tablet (10 mg total) by mouth at bedtime. 90 tablet 3   omega-3 acid ethyl esters (LOVAZA ) 1 g capsule Take 2 capsules by mouth twice daily 360 capsule 0   pantoprazole  (PROTONIX ) 40 MG tablet Take 1 tablet (40 mg total) by mouth 2 (two) times daily. 180 tablet 0   simvastatin  (ZOCOR ) 40 MG tablet TAKE 1 TABLET BY MOUTH AT BEDTIME 90 tablet 0   sitaGLIPtin  (JANUVIA ) 50 MG tablet Take 1 tablet by mouth once daily 90 tablet 0   sodium chloride  (OCEAN) 0.65 % SOLN nasal spray Place 1 spray into both nostrils as needed for congestion. Use prior to medicated nasal sprays 88 mL 3   vitamin B-12 (CYANOCOBALAMIN ) 100 MCG tablet Take 100 mcg by mouth daily.     Vitamin D , Ergocalciferol , (DRISDOL ) 1.25 MG  (50000 UNIT) CAPS capsule TAKE 1 CAPSULE BY MOUTH EVERY 7 DAYS ON SATURDAY 12 capsule 3   No facility-administered medications prior to visit.    Allergies  Allergen Reactions   Meloxicam Other (See Comments)   Rosuvastatin      Muscle pain   Semaglutide      Nausea, confusion, and blurred vision        Objective:        05/16/2024    8:55 AM 03/23/2024   10:51 AM 03/14/2024    8:57 AM  Vitals with BMI  Height 5' 1 5' 1 5' 1  Weight 151 lbs 142 lbs 148 lbs  BMI 28.55 26.84 27.98  Systolic 132 124 899  Diastolic 62 62 56  Pulse 85 71 65    No data found.   Physical Exam Vitals reviewed.  Constitutional:      Appearance: Normal appearance. She is normal weight.  Cardiovascular:     Rate and Rhythm: Normal rate and regular rhythm.     Heart sounds: Normal heart sounds.  Pulmonary:     Effort: Pulmonary effort is normal. No respiratory distress.     Breath sounds: Normal breath sounds.  Abdominal:     General: Bowel sounds are normal.     Palpations: Abdomen is soft.     Tenderness: There is no abdominal tenderness.     Hernia: No hernia is present.  Musculoskeletal:     Comments: RIGHT SHOULDER EXAM TENDER: YES ROM ABNORMAL ABDUCTION: LIMTED EXTERNAL ROTATION: DISCOMFORT INTERNAL ROTATION: DISCOMFORT EMPTY CAN SIGN: NEGATIVE.  FROM OF RIGHT HIP  NONTENDER IN INGUINAL REGION.  NONTENDER OVER RIGHT FOOT.   Neurological:     Mental Status: She is alert and oriented to person, place, and time.  Psychiatric:        Mood and Affect: Mood normal.        Behavior: Behavior normal.     Health Maintenance Due  Topic Date Due   COVID-19 Vaccine (7 - 2025-26 season) 02/15/2024   Mammogram  04/14/2024    There are no preventive care reminders to display for this patient.   Lab Results  Component Value Date   TSH 3.83 01/19/2024   Lab Results  Component Value Date   WBC 6.0 03/14/2024   HGB 13.5 03/14/2024   HCT 43.2 03/14/2024   MCV 93 03/14/2024    PLT 153 03/14/2024   Lab Results  Component Value Date   NA 141 03/14/2024   K 4.4 03/14/2024   CO2 22 03/14/2024   GLUCOSE 134 (H) 03/14/2024   BUN 27 03/14/2024   CREATININE 1.51 (H) 03/14/2024   BILITOT 1.1 03/14/2024   ALKPHOS 71 03/14/2024   AST 17 03/14/2024   ALT 13 03/14/2024   PROT 6.8 03/14/2024   ALBUMIN 4.6 03/14/2024   CALCIUM  9.9 03/14/2024   EGFR 36 (L) 03/14/2024   Lab Results  Component Value Date   CHOL 128 02/19/2023   Lab Results  Component Value Date   HDL 50 02/19/2023   Lab Results  Component Value Date   LDLCALC 61 02/19/2023   Lab Results  Component Value Date   TRIG 92 02/19/2023   Lab Results  Component Value Date   CHOLHDL 2.6 02/19/2023   Lab Results  Component Value Date   HGBA1C 7.3 03/14/2024        Results for orders placed or performed in visit on 05/03/24  OPHTHALMOLOGY REPORT-SCANNED   Collection Time: 05/02/24  8:55 AM  Result Value Ref Range   HM Diabetic Eye Exam No Retinopathy No Retinopathy   A Comment       Assessment & Plan:   Assessment & Plan Acute pain of right shoulder Popping sensation with movement, severe pain requiring two hands for lifting. Possible joint inflammation or nerve involvement. No recent trauma. Prednisone  considered for inflammation reduction. Discussed potential steroid injection if prednisone  fails. - Prescribed prednisone . - Ordered right shoulder x-ray. - Consider steroid injection if prednisone  ineffective. Orders:   predniSONE  (DELTASONE ) 50 MG tablet; Take 1 tablet (50 mg total) by mouth daily with breakfast.   DG Shoulder Right; Future  Diabetic glomerulopathy (HCC) Blood sugar 147 mg/dL. Prednisone  may temporarily elevate blood sugar. Discussed potential Januvia  dosage increase if blood sugar exceeds 200 mg/dL. - Monitor blood sugar during prednisone  course. - Consider increasing Januvia  if blood sugar exceeds 200 mg/dL.    Right foot pain Monitor. Normal exam.   Hopefully prednisone  will help.     Right inguinal pain Unclear etiology. Nontender today.  Monitor and if worsening or not fully resolving, call.      Body mass index is 28.53 kg/m..   Meds ordered this encounter  Medications   predniSONE  (DELTASONE ) 50 MG tablet    Sig: Take 1 tablet (50 mg total) by mouth daily with breakfast.    Dispense:  5 tablet    Refill:  0    Orders Placed This Encounter  Procedures   DG Shoulder Right     I,Marla I Leal-Borjas,acting as a scribe for Abigail Free, MD.,have documented all relevant documentation on the behalf of Abigail Free, MD,as directed by  Abigail Free, MD while in the presence of Abigail Free, MD.   Follow-up: No follow-ups on file.  An After Visit Summary was printed and given to the patient.  I attest that I have reviewed this visit and agree with the plan scribed by my staff.   Sicily Zaragoza  Kirstan Fentress, MD [facility] [phone]

## 2024-05-16 NOTE — Patient Instructions (Signed)
  VISIT SUMMARY: Today, we addressed your persistent right-sided bone pain, particularly in your shoulder, and discussed your type 2 diabetes management.  YOUR PLAN: ACUTE RIGHT SHOULDER PAIN/FOOT PAIN/GROIN PAIN: You have severe pain in your right shoulder, which may be due to joint inflammation or nerve involvement. You also heard a popping sound in your shoulder. -Start taking prednisone as prescribed to reduce inflammation. -Get an x-ray of your right shoulder. -If prednisone does not help, we may consider a steroid injection.  TYPE 2 DIABETES MELLITUS: Your blood sugar level was 147 mg/dL today. Prednisone may temporarily raise your blood sugar levels. -Monitor your blood sugar levels while taking prednisone. -If your blood sugar exceeds 200 mg/dL, we may need to increase your Januvia  dosage.   [facility] for xray. [address], , [place] [address] [phone]  IMAGING 8-5 PM MONDAY THROUGH FRIDAY.                     Contains text generated by [employer]bridge.                                 Contains text generated by [employer]bridge.

## 2024-05-17 ENCOUNTER — Telehealth: Payer: Self-pay

## 2024-05-17 NOTE — Telephone Encounter (Signed)
 PAP: Patient assistance application for Januvia  through [employer] has been mailed to pt's home address on file. Provider portion of application will be faxed to provider's office.  Provider portion of application will be faxed to Dr. Abigail Free

## 2024-05-19 NOTE — Telephone Encounter (Signed)
 PAP: Application for Januvia has been submitted to [employer], via fax

## 2024-05-20 DIAGNOSIS — M25511 Pain in right shoulder: Secondary | ICD-10-CM | POA: Insufficient documentation

## 2024-05-20 NOTE — Assessment & Plan Note (Addendum)
 Blood sugar 147 mg/dL. Prednisone  may temporarily elevate blood sugar. Discussed potential Januvia  dosage increase if blood sugar exceeds 200 mg/dL. - Monitor blood sugar during prednisone  course. - Consider increasing Januvia  if blood sugar exceeds 200 mg/dL.

## 2024-05-20 NOTE — Assessment & Plan Note (Addendum)
 Popping sensation with movement, severe pain requiring two hands for lifting. Possible joint inflammation or nerve involvement. No recent trauma. Prednisone  considered for inflammation reduction. Discussed potential steroid injection if prednisone  fails. - Prescribed prednisone . - Ordered right shoulder x-ray. - Consider steroid injection if prednisone  ineffective. Orders:   predniSONE  (DELTASONE ) 50 MG tablet; Take 1 tablet (50 mg total) by mouth daily with breakfast.   DG Shoulder Right; Future

## 2024-05-21 DIAGNOSIS — M79671 Pain in right foot: Secondary | ICD-10-CM | POA: Insufficient documentation

## 2024-05-21 DIAGNOSIS — R1031 Right lower quadrant pain: Secondary | ICD-10-CM | POA: Insufficient documentation

## 2024-05-21 NOTE — Assessment & Plan Note (Signed)
 Unclear etiology. Nontender today.  Monitor and if worsening or not fully resolving, call.

## 2024-05-21 NOTE — Assessment & Plan Note (Signed)
 Monitor. Normal exam.  Hopefully prednisone  will help.

## 2024-05-24 ENCOUNTER — Ambulatory Visit: Payer: Self-pay | Admitting: Family Medicine

## 2024-06-13 ENCOUNTER — Other Ambulatory Visit: Payer: Self-pay | Admitting: Family Medicine

## 2024-06-13 ENCOUNTER — Other Ambulatory Visit: Payer: Self-pay | Admitting: Allergy

## 2024-06-13 ENCOUNTER — Other Ambulatory Visit (HOSPITAL_COMMUNITY): Payer: Self-pay

## 2024-06-13 DIAGNOSIS — K219 Gastro-esophageal reflux disease without esophagitis: Secondary | ICD-10-CM

## 2024-06-13 MED ORDER — PANTOPRAZOLE SODIUM 40 MG PO TBEC
40.0000 mg | DELAYED_RELEASE_TABLET | Freq: Two times a day (BID) | ORAL | 0 refills | Status: AC
  Filled 2024-06-13: qty 180, 90d supply, fill #0

## 2024-06-21 NOTE — Telephone Encounter (Signed)
 PAP: Patient assistance application for Januvia  has been approved by PAP Companies: [employer] from 06/16/2024 to 06/15/2025. Medication should be delivered to PAP Delivery: Home. For further shipping updates, please contact [employer] at [phone]. Patient ID is: no ID Given

## 2024-06-23 ENCOUNTER — Other Ambulatory Visit: Payer: Self-pay | Admitting: Family Medicine

## 2024-06-23 MED ORDER — TRIAMCINOLONE ACETONIDE 0.1 % EX CREA: 1.0000 | TOPICAL_CREAM | Freq: Two times a day (BID) | CUTANEOUS | 0 refills | Status: DC

## 2024-06-29 ENCOUNTER — Other Ambulatory Visit: Payer: Self-pay | Admitting: Family Medicine

## 2024-07-05 ENCOUNTER — Encounter: Payer: Self-pay | Admitting: Allergy

## 2024-07-05 ENCOUNTER — Ambulatory Visit: Admitting: Allergy

## 2024-07-05 VITALS — BP 112/62 | HR 74 | Resp 16

## 2024-07-05 DIAGNOSIS — J310 Chronic rhinitis: Secondary | ICD-10-CM

## 2024-07-05 DIAGNOSIS — R053 Chronic cough: Secondary | ICD-10-CM

## 2024-07-05 DIAGNOSIS — K219 Gastro-esophageal reflux disease without esophagitis: Secondary | ICD-10-CM

## 2024-07-05 NOTE — Patient Instructions (Addendum)
 Chronic Rhinitis -continue avoidance measures for grasses, ragweed, weeds, trees, indoor molds, outdoor molds, dog, cockroach, horse, and mixed feathers.    -use saline nasal spray prior to medicated nasal sprays -use Azelastine  nasal spray 2 sprays twice daily as needed for nasal drainage/congestion.  Use this first line for nasal symptoms. -use Atrovent  nasal spray to reduce drainage as needed added on to Azelastine  if Azelastine  is not enough to control drainage.  Use 1-2 sprays up to three times as needed for drainage control.  Use less frequently if becomes too dry. -continue use of Zyrtec  10 mg daily as needed over Benadryl due to potential long-term memory effects of Benadryl.  Cough -continue to monitor symptoms, suspect related to drainage and reflux. -use albuterol  2 puffs every 4 hours as needed for coughing and shortness of breath. Breathing control goals:  Full participation in all desired activities (may need albuterol  before activity) Albuterol  use two time or less a week on average (not counting use with activity) Cough interfering with sleep two time or less a month Oral steroids no more than once a year No hospitalizations   Gastroesophageal Reflux Disease (GERD) - continue Protonix  40mg  twice daily and Pepcid  20mg  once daily. - continue current medications as prescribed by PCP.  Follow-up in 6 months or sooner if needed

## 2024-07-05 NOTE — Progress Notes (Signed)
 "   Follow-up Note  RE: Alicia Jordan MRN: [id number] DOB: July 04, 1945 Date of Office Visit: 07/05/2024   History of present illness: Alicia Jordan is a 79 y.o. female presenting today for follow-up of chronic rhinitis, cough and GERD.  She was last seen in the office on 01/05/24 by myself.  Discussed the use of AI scribe software for clinical note transcription with the patient, who gave verbal consent to proceed.  She has a history of melanoma, which was diagnosed and treated recently.  She underwent additional excision and was informed last week that all affected tissue was removed. She is currently in a monitoring phase and notes a knot under skin at the surgical site which she states dermatologist believes it to be scar tissue.  She uses an albuterol  inhaler a couple of times a month for shortness of breath, which provides relief. She does not use it daily and typically uses it about twice a week. No recent illnesses such as COVID, flu, or RSV. Shortness of breath is managed with albuterol .  Her reflux is generally well-controlled with prescribed medication, but she occasionally uses Mylanta about once every two to three weeks if symptoms worsen. Reflux sometimes affects her voice, causing changes.  She has a history of postnasal drip and has been prescribed nasal sprays such as azelastine  and ipratropium in the past. She is unsure if she currently has these medications at home and has not been using them recently. She takes Zyrtec  as needed, especially during pollen season.       Review of systems: 10pt ROS negative unless noted above in HPI   Past medical/social/surgical/family history have been reviewed and are unchanged unless specifically indicated below.  No changes  Medication List: Current Outpatient Medications  Medication Sig Dispense Refill   Acetaminophen (TYLENOL PO) Take by mouth as needed.     albuterol  (VENTOLIN  HFA) 108 (90 Base) MCG/ACT inhaler INHALE 2  PUFFS BY MOUTH EVERY 4 HOURS AS NEEDED FOR WHEEZING OR SHORTNESS OF BREATH 18 g 0   azelastine  (ASTELIN ) 0.1 % nasal spray Place 2 sprays into both nostrils 2 (two) times daily as needed for rhinitis. Use in each nostril as directed 30 mL 5   cetirizine  (ZYRTEC ) 10 MG tablet Take 1 tablet (10 mg total) by mouth daily. 90 tablet 3   cholecalciferol  (VITAMIN D3) 25 MCG (1000 UNIT) tablet Take 1 tablet (1,000 Units total) by mouth daily. 180 tablet 3   dapagliflozin  propanediol (FARXIGA ) 10 MG TABS tablet Take 1 tablet (10 mg) by mouth daily before breakfast. 90 tablet 1   ezetimibe  (ZETIA ) 10 MG tablet Take 1 tablet (10 mg) by mouth daily. 90 tablet 1   famotidine  (PEPCID ) 20 MG tablet Take 1 tablet (20 mg) by mouth at bedtime. 90 tablet 0   FLUoxetine  (PROZAC ) 20 MG capsule Take 2 capsules (40 mg) by mouth daily. 180 capsule 1   glucose blood test strip 1 each by Other route 2 (two) times daily. Use as instructed 100 each 2   ipratropium (ATROVENT ) 0.03 % nasal spray Place 1 spray into both nostrils 3 (three) times daily as needed for rhinitis (runny nose/drainage. use less if becomes too dry.). 30 mL 5   losartan  (COZAAR ) 100 MG tablet Take 1 tablet (100 mg total) by mouth every evening. 90 tablet 1   montelukast  (SINGULAIR ) 10 MG tablet Take 1 tablet (10 mg total) by mouth at bedtime. 90 tablet 3   omega-3 acid ethyl esters (LOVAZA ) 1  g capsule Take 2 capsules by mouth twice daily 360 capsule 0   pantoprazole  (PROTONIX ) 40 MG tablet Take 1 tablet (40 mg total) by mouth 2 (two) times daily. 180 tablet 0   predniSONE  (DELTASONE ) 50 MG tablet Take 1 tablet (50 mg total) by mouth daily with breakfast. 5 tablet 0   simvastatin  (ZOCOR ) 40 MG tablet TAKE 1 TABLET BY MOUTH AT BEDTIME 90 tablet 0   sitaGLIPtin  (JANUVIA ) 50 MG tablet Take 1 tablet by mouth once daily 90 tablet 0   sodium chloride  (OCEAN) 0.65 % SOLN nasal spray Place 1 spray into both nostrils as needed for congestion. Use prior to medicated  nasal sprays 88 mL 3   vitamin B-12 (CYANOCOBALAMIN ) 100 MCG tablet Take 100 mcg by mouth daily.     Vitamin D , Ergocalciferol , (DRISDOL ) 1.25 MG (50000 UNIT) CAPS capsule TAKE 1 CAPSULE BY MOUTH EVERY 7 DAYS ON SATURDAY 12 capsule 3   No current facility-administered medications for this visit.     Known medication allergies: Allergies[1]   Physical examination: Blood pressure 112/62, pulse 74, resp. rate 16, SpO2 98%.  General: Alert, interactive, in no acute distress. HEENT: PERRLA, TMs pearly gray, turbinates non-edematous without discharge, post-pharynx non erythematous. Neck: Supple without lymphadenopathy. Lungs: Clear to auscultation without wheezing, rhonchi or rales. {no increased work of breathing. CV: Normal S1, S2 without murmurs. Abdomen: Nondistended, nontender. Skin: Warm and dry, without lesions or rashes. Extremities:  No clubbing, cyanosis or edema. Neuro:   Grossly intact.  Diagnostics/Labs: None today  Assessment and plan:   Chronic Rhinitis -continue avoidance measures for grasses, ragweed, weeds, trees, indoor molds, outdoor molds, dog, cockroach, horse, and mixed feathers.    -use saline nasal spray prior to medicated nasal sprays -use Azelastine  nasal spray 2 sprays twice daily as needed for nasal drainage/congestion.  Use this first line for nasal symptoms. -use Atrovent  nasal spray to reduce drainage as needed added on to Azelastine  if Azelastine  is not enough to control drainage.  Use 1-2 sprays up to three times as needed for drainage control.  Use less frequently if becomes too dry. -continue use of Zyrtec  10 mg daily as needed over Benadryl due to potential long-term memory effects of Benadryl.  Cough -continue to monitor symptoms, suspect related to drainage and reflux. -use albuterol  2 puffs every 4 hours as needed for coughing and shortness of breath. Breathing control goals:  Full participation in all desired activities (may need albuterol   before activity) Albuterol  use two time or less a week on average (not counting use with activity) Cough interfering with sleep two time or less a month Oral steroids no more than once a year No hospitalizations  Gastroesophageal Reflux Disease (GERD) - continue Protonix  40mg  twice daily and Pepcid  20mg  once daily. - continue current medications as prescribed by PCP.  Follow-up in 6 months or sooner if needed  I appreciate the opportunity to take part in Maja's care. Please do not hesitate to contact me with questions.  Sincerely,   Danita Brain, MD Allergy /Immunology [facility]      [1]  Allergies Allergen Reactions   Meloxicam Other (See Comments)   Rosuvastatin      Muscle pain   Semaglutide      Nausea, confusion, and blurred vision   "

## 2024-07-08 ENCOUNTER — Other Ambulatory Visit (HOSPITAL_COMMUNITY): Payer: Self-pay

## 2024-07-14 ENCOUNTER — Encounter: Payer: Self-pay | Admitting: Family Medicine

## 2024-07-14 ENCOUNTER — Telehealth: Payer: Self-pay

## 2024-07-14 ENCOUNTER — Ambulatory Visit (INDEPENDENT_AMBULATORY_CARE_PROVIDER_SITE_OTHER)
Admission: RE | Admit: 2024-07-14 | Discharge: 2024-07-14 | Disposition: A | Discharge status: Home / Self Care | Source: Ambulatory Visit | Attending: Family Medicine | Admitting: Family Medicine

## 2024-07-14 ENCOUNTER — Other Ambulatory Visit: Payer: Self-pay

## 2024-07-14 ENCOUNTER — Ambulatory Visit: Admitting: Family Medicine

## 2024-07-14 VITALS — BP 128/74 | HR 74 | Temp 98.0°F | Ht 61.0 in | Wt 150.0 lb

## 2024-07-14 DIAGNOSIS — M25551 Pain in right hip: Secondary | ICD-10-CM

## 2024-07-14 DIAGNOSIS — J301 Allergic rhinitis due to pollen: Secondary | ICD-10-CM

## 2024-07-14 DIAGNOSIS — E538 Deficiency of other specified B group vitamins: Secondary | ICD-10-CM

## 2024-07-14 DIAGNOSIS — I129 Hypertensive chronic kidney disease with stage 1 through stage 4 chronic kidney disease, or unspecified chronic kidney disease: Secondary | ICD-10-CM

## 2024-07-14 DIAGNOSIS — Z7984 Long term (current) use of oral hypoglycemic drugs: Secondary | ICD-10-CM | POA: Diagnosis not present

## 2024-07-14 DIAGNOSIS — E782 Mixed hyperlipidemia: Secondary | ICD-10-CM | POA: Diagnosis not present

## 2024-07-14 DIAGNOSIS — N1832 Chronic kidney disease, stage 3b: Secondary | ICD-10-CM | POA: Diagnosis not present

## 2024-07-14 DIAGNOSIS — E1121 Type 2 diabetes mellitus with diabetic nephropathy: Secondary | ICD-10-CM | POA: Diagnosis not present

## 2024-07-14 DIAGNOSIS — F330 Major depressive disorder, recurrent, mild: Secondary | ICD-10-CM | POA: Diagnosis not present

## 2024-07-14 DIAGNOSIS — K219 Gastro-esophageal reflux disease without esophagitis: Secondary | ICD-10-CM | POA: Diagnosis not present

## 2024-07-14 DIAGNOSIS — E559 Vitamin D deficiency, unspecified: Secondary | ICD-10-CM

## 2024-07-14 LAB — POCT GLYCOSYLATED HEMOGLOBIN (HGB A1C): HbA1c POC (<> result, manual entry): 7.8 %

## 2024-07-14 MED ORDER — TACROLIMUS 0.1 % EX OINT: TOPICAL_OINTMENT | Freq: Two times a day (BID) | CUTANEOUS | 0 refills | Status: AC

## 2024-07-14 MED ORDER — SITAGLIPTIN PHOSPHATE 100 MG PO TABS: 100.0000 mg | ORAL_TABLET | Freq: Every day | ORAL | 1 refills | Status: AC

## 2024-07-14 NOTE — Assessment & Plan Note (Addendum)
 Managed with weekly prescription vitamin D  and additional OTC as needed. - Ordered vitamin D  level. Orders:   VITAMIN D  25 Hydroxy (Vit-D Deficiency, Fractures)

## 2024-07-14 NOTE — Assessment & Plan Note (Addendum)
 Managed with fluoxetine . - Continue fluoxetine  40 mg daily.

## 2024-07-14 NOTE — Assessment & Plan Note (Addendum)
 Chronic pain exacerbated by walking, unresponsive to acetaminophen or Biofreeze. Differential includes arthritis or muscle strain. NSAIDs contraindicated due to chronic kidney disease. - Ordered hip x-ray to evaluate for arthritis or structural issues. - Recommended rest and Voltaren gel (diclofenac) four times daily for 24-48 hours. - Advised acetaminophen up to 4000 mg per day as needed. Orders:   DG Hip Unilat W OR W/O Pelvis 2-3 Views Right; Future

## 2024-07-14 NOTE — Patient Instructions (Addendum)
" °  VISIT SUMMARY: During your visit, we discussed your right groin pain, diabetes management, and other ongoing health issues. We have made some adjustments to your medications and ordered several tests to better understand your current health status.  YOUR PLAN: RIGHT HIP PAIN: You have been experiencing right groin pain that worsens with walking and is not relieved by Tylenol or topical treatments. -We have ordered a hip x-ray to check for arthritis or structural issues. -Rest and use Voltaren gel (diclofenac) four times daily for 24-48 hours. -You can take acetaminophen up to 4000 mg per day as needed.  GO TO THE [facility] FOR H[facility] XRAY.  [facility] [address] [place], [place] [address] [phone] You may walk in.  IMAGING 8-7 PM MONDAY THROUGH FRIDAY.   TYPE 2 DIABETES MELLITUS WITH DIABETIC NEPHROPATHY: Your blood sugar levels are generally well-controlled, but your recent A1c of 7.8 indicates that there is room for improvement. -We have increased your Januvia  dose to 100 mg daily. -We have ordered blood work to check your A1c, cholesterol, blood count, B12, kidney function, and vitamin D  levels.  HYPERTENSIVE CHRONIC KIDNEY DISEASE STAGE 3B: You have stage 3b chronic kidney disease with hypertension. -Continue taking losartan  100 mg daily.  MIXED HYPERLIPIDEMIA: You have mixed hyperlipidemia managed with simvastatin  and Lovaza . -We have ordered a cholesterol panel to check your lipid levels.  GASTROESOPHAGEAL REFLUX DISEASE: Your GERD symptoms are well-controlled with your current medications. -Continue taking Protonix  40 mg twice daily and famotidine  20 mg at bedtime.  MAJOR DEPRESSIVE DISORDER, RECURRENT: Your depression is managed with fluoxetine . -Continue taking fluoxetine  40 mg daily.  VITAMIN B12 DEFICIENCY: You are currently supplementing for vitamin B12 deficiency. -We have ordered a B12 level test.  VITAMIN D  DEFICIENCY: You are managing your vitamin D   deficiency with weekly prescription vitamin D  and additional over-the-counter supplements as needed. -We have ordered a vitamin D  level test.  ALLERGIC RHINITIS DUE TO POLLEN: You have mild allergic rhinitis symptoms managed with azelastine , cetirizine , and montelukast . -Continue using azelastine  nasal spray, cetirizine , and montelukast  as needed.    Contains text generated by [employer].    "

## 2024-07-14 NOTE — Assessment & Plan Note (Addendum)
 Mild symptoms managed with azelastine , cetirizine , and montelukast . No new recommendations from allergy  specialist. - Continue azelastine  nasal spray, cetirizine , and montelukast  as needed.

## 2024-07-14 NOTE — Assessment & Plan Note (Addendum)
 Supplementation ongoing. Recent blood work incomplete. - Ordered B12 level. Orders:   Vitamin B12   Methylmalonic acid, serum

## 2024-07-14 NOTE — Assessment & Plan Note (Addendum)
 Managed with simvastatin , zetia , and Lovaza .  - Ordered cholesterol panel. Orders:   Lipid panel

## 2024-07-14 NOTE — Assessment & Plan Note (Addendum)
 Symptoms well-controlled with Protonix  and famotidine . - Continue Protonix  40 mg twice daily and famotidine  20 mg at bedtime.

## 2024-07-14 NOTE — Telephone Encounter (Signed)
 RX resent to pharmacy.  Copied from CRM [id number]. Topic: Clinical - Prescription Issue >> Jul 14, 2024  4:46 PM Hadassah PARAS wrote: Reason for CRM: Pt's husband has left pharm and was advised they never received a prescription for Tacrolimus  cream. Please advise pt on #[id number]   [facility] [address] [place] - [address] [place] [address] Phone: [phone] Fax: [phone]

## 2024-07-14 NOTE — Assessment & Plan Note (Addendum)
 Stage 3b chronic kidney disease with hypertension managed with losartan . - Continue losartan  100 mg daily. Orders:   Comprehensive metabolic panel with GFR   CBC with Differential/Platelet

## 2024-07-16 ENCOUNTER — Other Ambulatory Visit: Payer: Self-pay | Admitting: Family Medicine

## 2024-07-16 DIAGNOSIS — E782 Mixed hyperlipidemia: Secondary | ICD-10-CM

## 2024-07-17 ENCOUNTER — Ambulatory Visit: Payer: Self-pay | Admitting: Family Medicine

## 2024-07-18 LAB — CBC WITH DIFFERENTIAL/PLATELET
Basophils Absolute: 0.1 10*3/uL (ref 0.0–0.2)
Basos: 1 %
EOS (ABSOLUTE): 0.3 10*3/uL (ref 0.0–0.4)
Eos: 6 %
Hematocrit: 46.1 % (ref 34.0–46.6)
Hemoglobin: 14 g/dL (ref 11.1–15.9)
Immature Grans (Abs): 0 10*3/uL (ref 0.0–0.1)
Immature Granulocytes: 0 %
Lymphocytes Absolute: 1.6 10*3/uL (ref 0.7–3.1)
Lymphs: 27 %
MCH: 27.9 pg (ref 26.6–33.0)
MCHC: 30.4 g/dL — ABNORMAL LOW (ref 31.5–35.7)
MCV: 92 fL (ref 79–97)
Monocytes Absolute: 0.3 10*3/uL (ref 0.1–0.9)
Monocytes: 4 %
Neutrophils Absolute: 3.7 10*3/uL (ref 1.4–7.0)
Neutrophils: 62 %
Platelets: 185 10*3/uL (ref 150–450)
RBC: 5.01 x10E6/uL (ref 3.77–5.28)
RDW: 13.5 % (ref 11.7–15.4)
WBC: 6 10*3/uL (ref 3.4–10.8)

## 2024-07-18 LAB — VITAMIN B12: Vitamin B-12: 1266 pg/mL — ABNORMAL HIGH (ref 232–1245)

## 2024-07-18 LAB — COMPREHENSIVE METABOLIC PANEL WITH GFR
ALT: 20 [IU]/L (ref 0–32)
AST: 18 [IU]/L (ref 0–40)
Albumin: 4.6 g/dL (ref 3.8–4.8)
Alkaline Phosphatase: 70 [IU]/L (ref 49–135)
BUN/Creatinine Ratio: 15 (ref 12–28)
BUN: 25 mg/dL (ref 8–27)
Bilirubin Total: 1.2 mg/dL (ref 0.0–1.2)
CO2: 23 mmol/L (ref 20–29)
Calcium: 9.9 mg/dL (ref 8.7–10.3)
Chloride: 101 mmol/L (ref 96–106)
Creatinine, Ser: 1.65 mg/dL — ABNORMAL HIGH (ref 0.57–1.00)
Globulin, Total: 2.7 g/dL (ref 1.5–4.5)
Glucose: 163 mg/dL — ABNORMAL HIGH (ref 70–99)
Potassium: 4.2 mmol/L (ref 3.5–5.2)
Sodium: 139 mmol/L (ref 134–144)
Total Protein: 7.3 g/dL (ref 6.0–8.5)
eGFR: 32 mL/min/{1.73_m2} — ABNORMAL LOW

## 2024-07-18 LAB — LIPID PANEL
Chol/HDL Ratio: 4 ratio (ref 0.0–4.4)
Cholesterol, Total: 174 mg/dL (ref 100–199)
HDL: 44 mg/dL
LDL Chol Calc (NIH): 106 mg/dL — ABNORMAL HIGH (ref 0–99)
Triglycerides: 137 mg/dL (ref 0–149)
VLDL Cholesterol Cal: 24 mg/dL (ref 5–40)

## 2024-07-18 LAB — VITAMIN D 25 HYDROXY (VIT D DEFICIENCY, FRACTURES): Vit D, 25-Hydroxy: 44.6 ng/mL (ref 30.0–100.0)

## 2024-07-18 LAB — MICROALBUMIN / CREATININE URINE RATIO
Creatinine, Urine: 132 mg/dL
Microalb/Creat Ratio: 9 mg/g{creat} (ref 0–29)
Microalbumin, Urine: 11.4 ug/mL

## 2024-07-18 LAB — METHYLMALONIC ACID, SERUM: Methylmalonic Acid: 402 nmol/L — ABNORMAL HIGH (ref 0–378)

## 2024-07-21 ENCOUNTER — Other Ambulatory Visit: Payer: Self-pay | Admitting: Family Medicine

## 2024-07-21 DIAGNOSIS — E782 Mixed hyperlipidemia: Secondary | ICD-10-CM

## 2024-10-20 ENCOUNTER — Ambulatory Visit: Admitting: Family Medicine

## 2025-01-10 ENCOUNTER — Ambulatory Visit: Admitting: Allergy

## 2025-04-05 ENCOUNTER — Ambulatory Visit
